# Patient Record
Sex: Female | Born: 1950 | Race: White | Hispanic: No | Marital: Married | Smoking: Never smoker
Health system: Southern US, Community
[De-identification: ages and names within clinical notes are randomized; demographics above are authoritative.]

## PROBLEM LIST (undated history)

## (undated) DIAGNOSIS — M8580 Other specified disorders of bone density and structure, unspecified site: Secondary | ICD-10-CM

## (undated) DIAGNOSIS — Z800 Family history of malignant neoplasm of digestive organs: Secondary | ICD-10-CM

## (undated) DIAGNOSIS — Z8042 Family history of malignant neoplasm of prostate: Secondary | ICD-10-CM

## (undated) DIAGNOSIS — N393 Stress incontinence (female) (male): Secondary | ICD-10-CM

## (undated) DIAGNOSIS — K9289 Other specified diseases of the digestive system: Secondary | ICD-10-CM

## (undated) DIAGNOSIS — M419 Scoliosis, unspecified: Secondary | ICD-10-CM

## (undated) DIAGNOSIS — K224 Dyskinesia of esophagus: Secondary | ICD-10-CM

## (undated) DIAGNOSIS — E739 Lactose intolerance, unspecified: Secondary | ICD-10-CM

## (undated) DIAGNOSIS — D649 Anemia, unspecified: Secondary | ICD-10-CM

## (undated) DIAGNOSIS — Z8489 Family history of other specified conditions: Secondary | ICD-10-CM

## (undated) DIAGNOSIS — R202 Paresthesia of skin: Secondary | ICD-10-CM

## (undated) DIAGNOSIS — G5623 Lesion of ulnar nerve, bilateral upper limbs: Secondary | ICD-10-CM

## (undated) DIAGNOSIS — Z803 Family history of malignant neoplasm of breast: Secondary | ICD-10-CM

## (undated) DIAGNOSIS — I7300 Raynaud's syndrome without gangrene: Secondary | ICD-10-CM

## (undated) DIAGNOSIS — M1990 Unspecified osteoarthritis, unspecified site: Secondary | ICD-10-CM

## (undated) DIAGNOSIS — Z8043 Family history of malignant neoplasm of testis: Secondary | ICD-10-CM

## (undated) DIAGNOSIS — G8929 Other chronic pain: Secondary | ICD-10-CM

## (undated) DIAGNOSIS — K219 Gastro-esophageal reflux disease without esophagitis: Secondary | ICD-10-CM

## (undated) DIAGNOSIS — F338 Other recurrent depressive disorders: Secondary | ICD-10-CM

## (undated) DIAGNOSIS — M4056 Lordosis, unspecified, lumbar region: Secondary | ICD-10-CM

## (undated) DIAGNOSIS — I809 Phlebitis and thrombophlebitis of unspecified site: Secondary | ICD-10-CM

## (undated) DIAGNOSIS — Z8049 Family history of malignant neoplasm of other genital organs: Secondary | ICD-10-CM

## (undated) DIAGNOSIS — K5909 Other constipation: Secondary | ICD-10-CM

## (undated) DIAGNOSIS — F410 Panic disorder [episodic paroxysmal anxiety] without agoraphobia: Secondary | ICD-10-CM

## (undated) DIAGNOSIS — Z801 Family history of malignant neoplasm of trachea, bronchus and lung: Secondary | ICD-10-CM

## (undated) DIAGNOSIS — K220 Achalasia of cardia: Secondary | ICD-10-CM

## (undated) DIAGNOSIS — J45909 Unspecified asthma, uncomplicated: Secondary | ICD-10-CM

## (undated) DIAGNOSIS — Z9889 Other specified postprocedural states: Secondary | ICD-10-CM

## (undated) DIAGNOSIS — Z8719 Personal history of other diseases of the digestive system: Secondary | ICD-10-CM

## (undated) DIAGNOSIS — M549 Dorsalgia, unspecified: Secondary | ICD-10-CM

## (undated) DIAGNOSIS — M479 Spondylosis, unspecified: Secondary | ICD-10-CM

## (undated) DIAGNOSIS — R112 Nausea with vomiting, unspecified: Secondary | ICD-10-CM

## (undated) DIAGNOSIS — Z808 Family history of malignant neoplasm of other organs or systems: Secondary | ICD-10-CM

## (undated) DIAGNOSIS — M2550 Pain in unspecified joint: Secondary | ICD-10-CM

## (undated) DIAGNOSIS — Z8052 Family history of malignant neoplasm of bladder: Secondary | ICD-10-CM

## (undated) DIAGNOSIS — I10 Essential (primary) hypertension: Secondary | ICD-10-CM

## (undated) HISTORY — PX: KNEE ARTHROSCOPY W/ SYNOVECTOMY: SHX1887

## (undated) HISTORY — DX: Phlebitis and thrombophlebitis of unspecified site: I80.9

## (undated) HISTORY — DX: Anemia, unspecified: D64.9

## (undated) HISTORY — DX: Family history of malignant neoplasm of breast: Z80.3

## (undated) HISTORY — PX: KNEE ARTHROSCOPY: SUR90

## (undated) HISTORY — DX: Lordosis, unspecified, lumbar region: M40.56

## (undated) HISTORY — PX: ESOPHAGEAL DILATION: SHX303

## (undated) HISTORY — DX: Other recurrent depressive disorders: F33.8

## (undated) HISTORY — PX: OTHER SURGICAL HISTORY: SHX169

## (undated) HISTORY — DX: Pain in unspecified joint: M25.50

## (undated) HISTORY — DX: Family history of malignant neoplasm of bladder: Z80.52

## (undated) HISTORY — PX: TOTAL KNEE ARTHROPLASTY: SHX125

## (undated) HISTORY — PX: DILATION AND CURETTAGE OF UTERUS: SHX78

## (undated) HISTORY — DX: Family history of malignant neoplasm of digestive organs: Z80.0

## (undated) HISTORY — DX: Panic disorder (episodic paroxysmal anxiety): F41.0

## (undated) HISTORY — DX: Family history of malignant neoplasm of prostate: Z80.42

## (undated) HISTORY — DX: Family history of malignant neoplasm of other genital organs: Z80.49

## (undated) HISTORY — DX: Family history of malignant neoplasm of trachea, bronchus and lung: Z80.1

## (undated) HISTORY — DX: Raynaud's syndrome without gangrene: I73.00

## (undated) HISTORY — DX: Other specified disorders of bone density and structure, unspecified site: M85.80

## (undated) HISTORY — DX: Scoliosis, unspecified: M41.9

## (undated) HISTORY — DX: Family history of malignant neoplasm of testis: Z80.43

## (undated) HISTORY — DX: Family history of malignant neoplasm of other organs or systems: Z80.8

---
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
## 1998-12-25 ENCOUNTER — Encounter: Payer: Self-pay | Admitting: Gastroenterology

## 1998-12-25 ENCOUNTER — Other Ambulatory Visit: Admission: RE | Admit: 1998-12-25 | Discharge: 1998-12-25 | Payer: Self-pay | Admitting: Obstetrics and Gynecology

## 1998-12-25 ENCOUNTER — Ambulatory Visit (HOSPITAL_COMMUNITY): Admission: RE | Admit: 1998-12-25 | Discharge: 1998-12-25 | Payer: Self-pay | Admitting: Gastroenterology

## 1998-12-26 ENCOUNTER — Encounter: Payer: Self-pay | Admitting: Gastroenterology

## 1998-12-26 ENCOUNTER — Ambulatory Visit (HOSPITAL_COMMUNITY): Admission: RE | Admit: 1998-12-26 | Discharge: 1998-12-26 | Payer: Self-pay | Admitting: Gastroenterology

## 1999-01-23 ENCOUNTER — Ambulatory Visit (HOSPITAL_COMMUNITY): Admission: RE | Admit: 1999-01-23 | Discharge: 1999-01-23 | Payer: Self-pay | Admitting: *Deleted

## 1999-11-30 ENCOUNTER — Encounter: Admission: RE | Admit: 1999-11-30 | Discharge: 1999-11-30 | Payer: Self-pay | Admitting: Family Medicine

## 1999-12-06 ENCOUNTER — Encounter: Admission: RE | Admit: 1999-12-06 | Discharge: 1999-12-06 | Payer: Self-pay | Admitting: Family Medicine

## 2000-01-29 ENCOUNTER — Encounter: Admission: RE | Admit: 2000-01-29 | Discharge: 2000-01-29 | Payer: Self-pay | Admitting: Family Medicine

## 2000-02-13 DIAGNOSIS — M4056 Lordosis, unspecified, lumbar region: Secondary | ICD-10-CM

## 2000-02-13 HISTORY — DX: Lordosis, unspecified, lumbar region: M40.56

## 2000-02-20 ENCOUNTER — Other Ambulatory Visit: Admission: RE | Admit: 2000-02-20 | Discharge: 2000-02-20 | Payer: Self-pay | Admitting: Obstetrics and Gynecology

## 2000-04-21 ENCOUNTER — Ambulatory Visit (HOSPITAL_COMMUNITY): Admission: RE | Admit: 2000-04-21 | Discharge: 2000-04-21 | Payer: Self-pay | Admitting: Gastroenterology

## 2000-06-27 ENCOUNTER — Encounter: Admission: RE | Admit: 2000-06-27 | Discharge: 2000-06-27 | Payer: Self-pay | Admitting: Obstetrics and Gynecology

## 2000-06-27 ENCOUNTER — Encounter: Payer: Self-pay | Admitting: Obstetrics and Gynecology

## 2000-07-04 ENCOUNTER — Other Ambulatory Visit: Admission: RE | Admit: 2000-07-04 | Discharge: 2000-07-04 | Payer: Self-pay | Admitting: Obstetrics and Gynecology

## 2000-07-04 ENCOUNTER — Encounter (INDEPENDENT_AMBULATORY_CARE_PROVIDER_SITE_OTHER): Payer: Self-pay

## 2001-04-06 ENCOUNTER — Other Ambulatory Visit: Admission: RE | Admit: 2001-04-06 | Discharge: 2001-04-06 | Payer: Self-pay | Admitting: Obstetrics and Gynecology

## 2002-03-19 ENCOUNTER — Other Ambulatory Visit: Admission: RE | Admit: 2002-03-19 | Discharge: 2002-03-19 | Payer: Self-pay | Admitting: Obstetrics and Gynecology

## 2002-07-23 ENCOUNTER — Encounter: Admission: RE | Admit: 2002-07-23 | Discharge: 2002-07-23 | Payer: Self-pay | Admitting: Obstetrics and Gynecology

## 2002-07-23 ENCOUNTER — Encounter: Payer: Self-pay | Admitting: Obstetrics and Gynecology

## 2002-09-21 ENCOUNTER — Ambulatory Visit (HOSPITAL_COMMUNITY): Admission: RE | Admit: 2002-09-21 | Discharge: 2002-09-21 | Payer: Self-pay | Admitting: Obstetrics and Gynecology

## 2002-09-21 ENCOUNTER — Encounter (INDEPENDENT_AMBULATORY_CARE_PROVIDER_SITE_OTHER): Payer: Self-pay

## 2003-06-16 ENCOUNTER — Other Ambulatory Visit: Admission: RE | Admit: 2003-06-16 | Discharge: 2003-06-16 | Payer: Self-pay | Admitting: Obstetrics and Gynecology

## 2003-08-16 LAB — HM COLONOSCOPY

## 2003-09-18 ENCOUNTER — Encounter: Admission: RE | Admit: 2003-09-18 | Discharge: 2003-09-18 | Payer: Self-pay | Admitting: Orthopedic Surgery

## 2003-09-27 ENCOUNTER — Ambulatory Visit (HOSPITAL_BASED_OUTPATIENT_CLINIC_OR_DEPARTMENT_OTHER): Admission: RE | Admit: 2003-09-27 | Discharge: 2003-09-27 | Payer: Self-pay | Admitting: Orthopedic Surgery

## 2004-05-08 ENCOUNTER — Encounter: Admission: RE | Admit: 2004-05-08 | Discharge: 2004-05-08 | Payer: Self-pay | Admitting: Obstetrics and Gynecology

## 2004-07-15 HISTORY — PX: HELLER MYOTOMY: SHX5259

## 2004-08-20 ENCOUNTER — Other Ambulatory Visit: Admission: RE | Admit: 2004-08-20 | Discharge: 2004-08-20 | Payer: Self-pay | Admitting: Obstetrics and Gynecology

## 2005-08-22 ENCOUNTER — Other Ambulatory Visit: Admission: RE | Admit: 2005-08-22 | Discharge: 2005-08-22 | Payer: Self-pay | Admitting: Obstetrics & Gynecology

## 2005-09-02 ENCOUNTER — Encounter: Admission: RE | Admit: 2005-09-02 | Discharge: 2005-09-02 | Payer: Self-pay | Admitting: Obstetrics and Gynecology

## 2005-09-12 HISTORY — PX: VARICOSE VEIN SURGERY: SHX832

## 2006-02-05 ENCOUNTER — Inpatient Hospital Stay (HOSPITAL_COMMUNITY): Admission: RE | Admit: 2006-02-05 | Discharge: 2006-02-10 | Payer: Self-pay | Admitting: Orthopedic Surgery

## 2006-09-09 ENCOUNTER — Other Ambulatory Visit: Admission: RE | Admit: 2006-09-09 | Discharge: 2006-09-09 | Payer: Self-pay | Admitting: Obstetrics & Gynecology

## 2006-09-10 ENCOUNTER — Encounter: Admission: RE | Admit: 2006-09-10 | Discharge: 2006-09-10 | Payer: Self-pay | Admitting: Obstetrics and Gynecology

## 2006-09-29 ENCOUNTER — Encounter: Admission: RE | Admit: 2006-09-29 | Discharge: 2006-09-29 | Payer: Self-pay | Admitting: Obstetrics and Gynecology

## 2007-08-14 ENCOUNTER — Inpatient Hospital Stay (HOSPITAL_COMMUNITY): Admission: RE | Admit: 2007-08-14 | Discharge: 2007-08-18 | Payer: Self-pay | Admitting: Orthopedic Surgery

## 2007-10-12 ENCOUNTER — Other Ambulatory Visit: Admission: RE | Admit: 2007-10-12 | Discharge: 2007-10-12 | Payer: Self-pay | Admitting: Obstetrics and Gynecology

## 2007-11-25 ENCOUNTER — Encounter: Admission: RE | Admit: 2007-11-25 | Discharge: 2007-11-25 | Payer: Self-pay | Admitting: Obstetrics and Gynecology

## 2007-12-11 ENCOUNTER — Encounter: Admission: RE | Admit: 2007-12-11 | Discharge: 2007-12-11 | Payer: Self-pay | Admitting: Obstetrics and Gynecology

## 2008-10-17 ENCOUNTER — Other Ambulatory Visit: Admission: RE | Admit: 2008-10-17 | Discharge: 2008-10-17 | Payer: Self-pay | Admitting: Obstetrics and Gynecology

## 2009-02-27 DIAGNOSIS — I10 Essential (primary) hypertension: Secondary | ICD-10-CM | POA: Insufficient documentation

## 2009-02-27 DIAGNOSIS — J45909 Unspecified asthma, uncomplicated: Secondary | ICD-10-CM | POA: Insufficient documentation

## 2009-05-24 DIAGNOSIS — G5620 Lesion of ulnar nerve, unspecified upper limb: Secondary | ICD-10-CM | POA: Insufficient documentation

## 2009-05-24 DIAGNOSIS — J309 Allergic rhinitis, unspecified: Secondary | ICD-10-CM | POA: Insufficient documentation

## 2009-06-21 ENCOUNTER — Encounter: Admission: RE | Admit: 2009-06-21 | Discharge: 2009-06-21 | Payer: Self-pay | Admitting: Obstetrics and Gynecology

## 2009-08-07 ENCOUNTER — Ambulatory Visit (HOSPITAL_COMMUNITY): Admission: RE | Admit: 2009-08-07 | Discharge: 2009-08-07 | Payer: Self-pay | Admitting: Orthopedic Surgery

## 2009-09-20 ENCOUNTER — Ambulatory Visit (HOSPITAL_BASED_OUTPATIENT_CLINIC_OR_DEPARTMENT_OTHER): Admission: RE | Admit: 2009-09-20 | Discharge: 2009-09-20 | Payer: Self-pay | Admitting: Orthopedic Surgery

## 2009-10-18 LAB — HM PAP SMEAR

## 2010-01-29 ENCOUNTER — Ambulatory Visit (HOSPITAL_COMMUNITY): Admission: RE | Admit: 2010-01-29 | Discharge: 2010-01-29 | Payer: Self-pay | Admitting: Obstetrics and Gynecology

## 2010-08-05 ENCOUNTER — Encounter: Payer: Self-pay | Admitting: Obstetrics and Gynecology

## 2010-09-30 ENCOUNTER — Ambulatory Visit (HOSPITAL_COMMUNITY)
Admission: AD | Admit: 2010-09-30 | Discharge: 2010-09-30 | Disposition: A | Discharge status: Home / Self Care | Payer: Managed Care, Other (non HMO) | Source: Ambulatory Visit | Attending: Gastroenterology | Admitting: Gastroenterology

## 2010-09-30 DIAGNOSIS — IMO0002 Reserved for concepts with insufficient information to code with codable children: Secondary | ICD-10-CM | POA: Insufficient documentation

## 2010-09-30 DIAGNOSIS — J45909 Unspecified asthma, uncomplicated: Secondary | ICD-10-CM | POA: Insufficient documentation

## 2010-09-30 DIAGNOSIS — I10 Essential (primary) hypertension: Secondary | ICD-10-CM | POA: Insufficient documentation

## 2010-09-30 DIAGNOSIS — Z96659 Presence of unspecified artificial knee joint: Secondary | ICD-10-CM | POA: Insufficient documentation

## 2010-09-30 DIAGNOSIS — T18108A Unspecified foreign body in esophagus causing other injury, initial encounter: Secondary | ICD-10-CM | POA: Insufficient documentation

## 2010-09-30 LAB — CBC
HCT: 40.8 % (ref 36.0–46.0)
MCH: 32.4 pg (ref 26.0–34.0)
MCHC: 34.7 g/dL (ref 30.0–36.0)
MCV: 93.2 fL (ref 78.0–100.0)
RDW: 13.8 % (ref 11.5–15.5)

## 2010-09-30 LAB — BASIC METABOLIC PANEL
BUN: 11 mg/dL (ref 6–23)
Chloride: 105 mEq/L (ref 96–112)
GFR calc non Af Amer: >60 mL/min (ref >60)
Glucose, Bld: 103 mg/dL — ABNORMAL HIGH (ref 70–99)
Potassium: 3.3 mEq/L — ABNORMAL LOW (ref 3.5–5.1)

## 2010-10-07 LAB — POCT PREGNANCY, URINE: Preg Test, Ur: NEGATIVE

## 2010-11-27 NOTE — Op Note (Signed)
Beth Peters, Beth Peters             ACCOUNT NO.:  [id number]   MEDICAL RECORD NO.:  [id number]          PATIENT TYPE:  INP   LOCATION:  0010                         FACILITY:  [facility]   PHYSICIAN:  Ollen Gross, M.D.    DATE OF BIRTH:  12-03-1950   DATE OF PROCEDURE:  08/14/2007  DATE OF DISCHARGE:                               OPERATIVE REPORT   PREOPERATIVE DIAGNOSIS:  Osteoarthritis, right knee.   POSTOPERATIVE DIAGNOSIS:  Osteoarthritis, right knee.   PROCEDURE:  Right total knee arthroplasty.   SURGEON:  Ollen Gross, M.D.   ASSISTANT:  Avel Peace. P.A.-C.   ANESTHESIA:  General with postoperative Marcaine pain pump.   ESTIMATED BLOOD LOSS:  Minimal.   DRAIN:  None.   TOURNIQUET TIME:  31 minutes at 300 mmHg.   COMPLICATIONS:  None.   CONDITION:  Stable to recovery.   BRIEF CLINICAL NOTE:  Beth Peters is a 59 year old female with end-stage  osteoarthritis of the right knee with progressively worsening pain and  dysfunction.  She has had a successful left total knee arthroplasty and  presents now for right total knee arthroplasty.   PROCEDURE IN DETAIL:  After successful administration of general  anesthetic, a tourniquet is placed high on the right thigh, and right  lower extremity is prepped and draped in the usual sterile fashion.  Extremity is wrapped in Esmarch, knee flexed and tourniquet is inflated  to 300 mmHg.  Midline incision is made with a 10 blade through  subcutaneous tissue to the level of the extensor mechanism.  Fresh blade  is used make a medial parapatellar arthrotomy.  Soft tissue over the  proximal and medial tibia is subperiosteally elevated through the joint  line with the knife and into the semimembranosus bursa with a Cobb  elevator.  Soft tissue over the proximal and lateral tibia is elevated  with attention being paid to avoiding the patellar tendon over the  tibial tubercle.  Patella subluxed laterally, knee flexed to 90 degrees,  ACL and  PCL removed.  Drill is used to create a starting hole in the  distal femur, and the canal is thoroughly irrigated.  The 5 degrees  right valgus alignment guide is placed, and referencing off the  posterior condyles, rotation is marked and the block pinned to remove 10  mm off the distal femur.  Distal femoral resection is made with an  oscillating saw.  Sizing blocks placed and size 2.5 is most appropriate.  Rotation is marked off the epicondylar axis.  Size 2.5 cutting blocks  placed, and the anterior, posterior and chamfer cuts are made.   Tibia subluxed forward and the menisci are removed.  The extramedullary  tibial alignment guide is placed referencing proximally at the medial  aspect of tibial tubercle and distally along the second metatarsal axis  and tibial crest.  The block is pinned to remove 10 mm off the  nondeficient lateral side.  I went down an additional 2 to get to the  base of the medial defect.  Tibial resection is made with an oscillating  saw.  Size 2.5 is most appropriate tibial  component, and the proximal  tibia is prepared with the modular drill and keel punch for the size  2.5.  Femoral preparation is completed with the intercondylar cut.   Size 2.5 mobile bearing tibial trial, 2.5 posterior stabilized femoral  trial, and a 12.5-mm posterior stabilized rotating platform insert trial  are placed.  With a 12.5, full extension is achieved with excellent  varus and valgus balance throughout full range of motion.  Patella is  everted and thickness measured to be 21 mm.  Freehand resection is taken  to 12 mm, the 35 template is placed, lug holes are drilled, trial  patella is placed, and it tracks normally.  Osteophytes removed off the  posterior femur with the trial in place.  All trials removed, and the  cut bone surfaces are prepared with pulsatile lavage.  Cement is mixed,  and once ready for implantation, a size 2.5 mobile bearing tibial tray,  size 2.5 posterior  stabilized femur, and 35 patella are cemented into  place, and patella is held with the clamp.  A trial 12.5-mm posterior  stabilized rotating platform insert is placed in the tibial tray.  The  knee is held in full extension and all extruded cement removed.  Once  cement fully hardened and the wound is copiously irrigated with saline  solution, and FloSeal injected on the posterior capsule, the permanent  12.5-mm posterior stabilized rotating platform insert is then placed  into the tibial tray.  The FloSeal is injected into medial and lateral  gutters and suprapatellar area.  Moist sponge is placed and tourniquet  released with total time of 31 minutes.  The wound is again irrigated  and the extensor mechanism closed with interrupted #1 PDS.  Flexion  against gravity is 135 degrees.  Subcu is closed with interrupted 2-0  Vicryl and subcuticular running 4-0 Monocryl.  The catheter for Marcaine  pain pump is placed, and the pump is initiated.  Steri-Strips and a  bulky sterile dressing are applied, and she is placed into a knee  immobilizer, awakened and transferred to recovery in stable condition.      Ollen Gross, M.D.  Electronically Signed     FA/MEDQ  D:  08/14/2007  T:  08/15/2007  Job:  [id number]

## 2010-11-27 NOTE — H&P (Signed)
Beth Peters, Beth Peters             ACCOUNT NO.:  [id number]   MEDICAL RECORD NO.:  [id number]          PATIENT TYPE:  INP   LOCATION:  1611                         FACILITY:  [facility]   PHYSICIAN:  Ollen Gross, M.D.    DATE OF BIRTH:  1950/11/18   DATE OF ADMISSION:  08/14/2007  DATE OF DISCHARGE:                              HISTORY & PHYSICAL   DATE OF OFFICE VISIT HISTORY AND PHYSICAL:  Performed on July 31, 2007   CHIEF COMPLAINT:  Right knee pain.   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 Homero Fellers Aluisio having had a long, progressive history of  increasing pain in her knee.  She has been treated conservatively in the  past including injections for knee arthritis.  She has reached the point  now where she would like to have something done about.  It is felt she  would be a good candidate.  Risks and benefits have been discussed and  she elected to proceed with surgery.   ALLERGIES:  No known drug allergies.   CURRENT MEDICATIONS:  Celebrex, Nexium, hydrochlorothiazide, Vivelle-  Dot, Prometrium, Crestor.   PAST MEDICAL HISTORY:  1. Anxiety.  2. Mild asthma.  3. History of bronchitis.  4. History of pneumonia.  5. Hypertension.  6. Achalasia.  7. Reflux disease.  8. Osteopenia.  9. History of uterine fibroids.  10.Degenerative disk disease.  11.Childhood illnesses to include measles, mumps and rubella.   PAST SURGICAL HISTORY:  1. Tonsils.  2. Calcium deposit removed from her knee.  3. Left knee meniscal surgery.  4. Left knee cartilage repair.  5. Heller myotomy.  6. Vein removal.  7. Left total knee in 2007.   SOCIAL HISTORY:  Married, nonsmoker.  One to two glasses of wine a  couple of days a week.  Two children.  Family will be assisting with  care after surgery.   FAMILY HISTORY:  Father deceased with history of cancer and stroke.  Mother, age 16, heart disease, thyroid and arthritis.  She has three  siblings  and they have a history of cancer, thyroid disease and  hypertension.   REVIEW OF SYSTEMS:  GENERAL:  No fevers, chills or night sweats.  NEURO:  No seizure, syncope, paralysis.  RESPIRATORY:  No shortness of breath,  productive cough or hemoptysis.  CARDIOVASCULAR:  No chest pain, angina,  orthopnea.  GI:  She does have achalasia with difficulty swallowing and  reflux.  No nausea, vomiting, diarrhea, constipation.  GU:  No dysuria  or hematuria, a little bit of nocturia.  MUSCULOSKELETAL:  Right knee.   PHYSICAL EXAMINATION:  VITAL SIGNS:  Pulse 72, respirations 12, blood  pressure 122/78.  GENERAL:  A 60 year old white female, well-nourished, well-developed, no  acute distress, alert and oriented and cooperative, very pleasant,  excellent historian.  HEENT:  Normocephalic, atraumatic.  Pupils are round and reactive.  Oropharynx clear.  EOMs intact.  NECK:  Supple.  CHEST: Clear.  HEART:  Regular rate and rhythm.  No murmur.  S1, S2 noted.  ABDOMEN:  Soft, nontender.  Bowel  sounds present.  RECTAL, BREAST, GENITALIA:  Not done, not pertinent to present illness.  EXTREMITIES:  Right knee:  Moderate effusion, tender medially, valgus  malalignment.   IMPRESSION:  1. Osteoarthritis right knee.  2. Anxiety.  3. Mild asthma.  4. History of bronchitis.  5. History of pneumonia.  6. Hypertension.  7. Achalasia.  8. Reflux disease.  9. Osteopenia.  10.History of uterine fibroids.  11.Degenerative disk disease.   PLAN:  The patient admitted to [facility] to undergo a right  total knee replacement arthroplasty.  Surgery will be performed by Dr.  Ollen Gross.      Alexzandrew L. Perkins, P.A.C.      Ollen Gross, M.D.  Electronically Signed    ALP/MEDQ  D:  08/15/2007  T:  08/16/2007  Job:  [id number]   cc:   Gloriajean Dell. Andrey Campanile, M.D.  Fax: [phone]   Rema Fendt, NP  [facility]   Bernette Redbird, M.D.  Fax: [phone]

## 2010-11-30 NOTE — Op Note (Signed)
NAME:  Beth Peters, Beth Peters                       ACCOUNT NO.:  [id number]   MEDICAL RECORD NO.:  [id number]                   PATIENT TYPE:  AMB   LOCATION:  DSC                                  FACILITY:  [facility]   PHYSICIAN:  Ollen Gross, M.D.                 DATE OF BIRTH:  18-Jul-1950   DATE OF PROCEDURE:  09/27/2003  DATE OF DISCHARGE:                                 OPERATIVE REPORT   PREOPERATIVE DIAGNOSIS:  Left knee chondral defect.   POSTOPERATIVE DIAGNOSIS:  Left knee chondral defect.   PROCEDURE:  Left knee arthroscopy with chondroplasty.   SURGEON:  Ollen Gross, M.D.   ANESTHESIA:  General plus knee block.   ESTIMATED BLOOD LOSS:  Minimal.   DRAINS:  Hemovac x 1.   COMPLICATIONS:  None.   CONDITION:  Stable to the recovery room.   BRIEF CLINICAL NOTE:  Beth Peters is a 60 year old female with significant left knee  pain and recurrent effusions.  She has medial and lateral pain.  She had a  recent knee arthroscopy about 4-5 months ago with a good result initially  and then began to deteriorate with recurrent pain and swelling.  Exam and  history are consistent with chondral defect.  She presents now for  arthroscopy and debridement.   PROCEDURE IN DETAIL:  After successful administration of knee block and  subsequent general anesthetic, a tourniquet was placed high on the left  thigh and the left lower extremity prepped and draped in the usual sterile  fashion.  Standard superomedial and inferolateral incisions were made and  the in flow cannula passed superomedial and camera passed inferolateral.  Arthroscopic visualization proceeds.  There is a tremendous amount of  synovitis noted with a large suprapatellar medial plica.  There is grade 2  and 3 chondromalacia on the under surface of the patella with no focal  chondral defect.  The trochlea looks normal.  There was just some grade 1  change in the trochlea.  The medial and lateral gutters were visualized and  there  is synovitis but no loose body.  Flexion valgus was applied to the  knee and the medial compartment was entered.  A spinal needle was used to  localize the inferomedial portal.  A small incision was made and the probe  was placed.  The medial meniscal remnant appears and probes normally.  There  is no recurrent tear.  There is some chondromalacia grade 2 and 3 on the  medial femoral condyle with unstable appearing cartilage on the condyle.  I  debrided it back to a stable cartilaginous base with the shaver.  It was  about a 2 by 2 cm area.  There was no exposed bone.  The intercondylar notch  is visualized and the ACL appears and probes normally.  The lateral  compartment was entered.  There was some grade 1 change on the lateral  tibial plateau but no  focal chondral defects and no meniscal tear.  I again  addressed the patellofemoral compartment by debriding the unstable cartilage  on the under surface of the patella back to a stable cartilaginous space.  Again, there is no exposed bone.  The superomedial plica is also debrided.  I did debride the inflamed synovial tissue throughout the joint.  The  arthroscopic equipment was then removed from the inferior portals which were  closed with interrupted 4-0 nylon.  20 mL of 0.25% Marcaine with epinephrine  was injected through the in flow cannula and then a Hemovac drain is  threaded through the cannula, the cannula removed, and that portal sewn with  the drain not sewn in.  A bulky, sterile dressing is applied.  She is then  awakened and transferred to the recovery room in stable condition.                                               Ollen Gross, M.D.    FA/MEDQ  D:  09/27/2003  T:  09/28/2003  Job:  [id number]

## 2010-11-30 NOTE — Op Note (Signed)
NAMEDANETRA, GLOCK             ACCOUNT NO.:  [id number]   MEDICAL RECORD NO.:  [id number]          PATIENT TYPE:  INP   LOCATION:  0004                         [facility]ACILITY:  [facility]   PHYSICIAN:  Ollen Gross, M.D.    DATE OF BIRTH:  Dec 23, 1950   DATE OF PROCEDURE:  02/05/2006  DATE OF DISCHARGE:                                 OPERATIVE REPORT   PREOPERATIVE DIAGNOSIS:  Osteoarthritis, left knee.   POSTOPERATIVE DIAGNOSIS:  Osteoarthritis, left knee.   PROCEDURE:  Left total knee arthroplasty.   SURGEON:  Ollen Gross, M.D.   ASSISTANT:  Alexzandrew L. Julien Girt, P.A.   ANESTHESIA:  General with postoperative Marcaine pain pump.   ESTIMATED BLOOD LOSS:  Minimal   DRAINS:  Hemovac x1.   TOURNIQUET TIME:  46 minutes at 300 mmHg.   COMPLICATIONS:  None.   CONDITION:  Stable to recovery.   BRIEF CLINICAL NOTE:  Beth Peters is a 60 year old female who has had a long history  of problems related to her left knee.  She has had several arthroscopies and  has had recurrent pain.  She has significant degenerative change in medial  and patellofemoral compartments and presents for total knee arthroplasty.   PROCEDURE IN DETAIL:  After successful initiation of general anesthetic, a  tourniquet was placed high on the left thigh and left lower extremity  prepped and draped in usual sterile fashion.  Extremity was wrapped in  Esmarch, knee flexed, tourniquet inflated to 300 mmHg.  Midline incision  made with a 10 blade through subcutaneous tissue to the level of the  extensor mechanism.  Fresh blade was used make a medial parapatellar  arthrotomy.  Soft tissue of the proximal medial tibia was subperiosteally  elevated to the joint line with the knife entered through the  semimembranosus bursa with a Cobb elevator.  Soft tissue laterally was  elevated with attention being paid to avoiding the patellar tendon on tibial  tubercle.  Patella subluxed laterally, knee flexed 90 degrees, ACL and  PCL  removed.  She had bone on bone in the medial compartment as well as trochlea  and patella.  Drill was used to create a starting in the distal femur, and  canal was thoroughly irrigated.  Five-degree left valgus alignment guide was  placed, and referencing off the posterior condyle, rotation is marked and a  block pin removed 10 mm off the distal femur.  Distal femoral resection was  made with an oscillating saw.  Sizing block was placed, and 2.5 was most  appropriate.  Rotations marked off the epicondylar axis.  A 2.5 cutting  block was placed, and the anterior, posterior and chamfer cuts made.   Tibia was subluxed forward, the menisci were removed.  Extramedullary tibial  alignment guide was placed referencing proximally at the medial aspect of  the tibial tubercle and distally along the second metatarsal axis and tibial  crest.  Block pin removed 10 mm of the non deficient lateral side.  Tibial  resection was made with an oscillating saw.  Size 2.5 was the most  appropriate tibial component and the proximal tibia prepared  with a modular  drill and keel punch for a 2.5.  Femoral preparation was completed the  intercondylar cut.   Size 2.5 mobile bearing tibial trial size, 2.5 posterior stabilized femoral  trial and 10-mm posterior stabilized rotating platform insert trial were  placed.  A 10 was too loose in flexion and was perfectly balanced in  extension.  We wen to 12.5 which was well balanced in flexion but was too  tight in extension.  I thus removed the femoral component and then again  placed the distal femoral cutting guide, referencing with the intramedullary  rod.  I took an additional 2 mm off of the distal femur and then again redid  the chamfer cuts and intercondylar cut.  We then placed the 2.5 femoral  trial again.  At this time with 12.5 mm spacer, we had great balance in  flexion and extension throughout full range of motion.  Patella was then  everted, thickness  measured to be 22 mm.  Freehand resection was taken down  to 12 mm, 35 template was placed, lug holes were drilled, trial patella was  placed and it tracks normally.  Osteophytes removed with posterior femur  trials in place.  All trials removed, and the cut bone surfaces prepared  with pulsatile lavage.  Cement was mixed, and once ready for implantation,  the size 2.5 mobile bearing tibial tray, the size 2.5 posterior stabilized  femur and 35 patella were cemented into place, and patella was held the  clamp.  A trial of 12.5 insert was placed, knee held in full extension, all  extruded cement removed.  Once cement was fully hardened, the permanent 12.5-  mm posterior stabilized rotating platform insert was placed into the tibial  tray.  The wound was copiously irrigated with saline solution and extensor  mechanism closed over a Hemovac drain with interrupted #1 PDS.  Flexion  against gravity was 135 degrees.  Tourniquet was released at a total time of  46 minutes.  Subcu was closed interrupted 2-0 Vicryl and subcuticular  running 4-0 Monocryl.  Hemovac drain was hooked to suction.  Catheter for  the Marcaine pain pump was placed, and the pump was initiated.  Steri-Strips  and a bulky sterile dressing were applied, and she was placed into a knee  immobilizer, awakened and transferred to recovery in stable condition.      Ollen Gross, M.D.  Electronically Signed     FA/MEDQ  D:  02/05/2006  T:  02/05/2006  Job:  [id number]

## 2010-11-30 NOTE — Op Note (Signed)
NAME:  TORRANCE, FRECH                       ACCOUNT NO.:  [id number]   MEDICAL RECORD NO.:  [id number]                   PATIENT TYPE:  AMB   LOCATION:  SDC                                  FACILITY:  [facility]   PHYSICIAN:  Laqueta Linden, M.D.                 DATE OF BIRTH:  May 15, 1951   DATE OF PROCEDURE:  09/21/2002  DATE OF DISCHARGE:                                 OPERATIVE REPORT   PREOPERATIVE DIAGNOSES:  Perimenopausal dysfunctional uterine bleeding due  to endometrial polyp/submucosal fibroid.   POSTOPERATIVE DIAGNOSES:  Perimenopausal dysfunctional uterine bleeding due  to endometrial polyp/submucosal fibroid.   PROCEDURE:  Hysteroscopic resection with curettage.   SURGEON:  Laqueta Linden, M.D.   ANESTHESIA:  General endotracheal.   ESTIMATED BLOOD LOSS:  Less than 50 mL.   FLUIDS:  Sorbitol net intake 80 mL.   COMPLICATIONS:  None.   CONDITION ON DISCHARGE:  Stable to the PACU.   INDICATIONS:  The patient is a 60 year old gravida 2, para 2 married white  female with perimenopausal bleeding on hormonal replacement therapy.  Her  ultrasound with sonohysterogram revealed a 2.1 x 1.6 x 0.9 cm polyp versus  submucosal fibroid.  She has seen the informed consent film, voiced her  understanding and acceptance of the risks of procedure including, but not  limited to, anesthesia risks, infection, bleeding, possible incomplete  relief of symptoms, risk of uterine perforation, possible regrowth  particularly of the fibroid with continued bleeding, and agrees to proceed.  Full consent was given.  The patient was placed under general endotracheal  anesthesia due to her history of achalasia and severe reflux.   PROCEDURE:  The patient was taken to the operating room and after proper  identification and consents were ascertained she was placed on the operating  table in supine position.  After the induction of general endotracheal  anesthesia she was placed in the [place]  stirrups and the perineum and vagina  were prepped and draped in a routine sterile fashion.  A transurethral Foley  was placed and removed at the conclusion of the procedure.  Bimanual  examination confirmed an anterior, slightly enlarged, irregular uterus which  was mobile.  The speculum was placed in the vagina.  The cervix grasped with  a single tooth tenaculum.  The internal os was gently dilated to a number 33  Pratt dilator.  The resectoscope with continuous sorbitol infusion was then  inserted under direct vision.  The endocervical canal was free of lesions.  There was a large polyp projecting off of the left lateral wall of the  endometrial cavity.  Above this was one or two partially submucosal  fibroids.  Visualization of the upper fundus was compromised by these  lesions.  The double loop resectoscope was utilized on routine settings and  the polyp and then the fibroids were resected in multiple pieces.  It should  be noted  that probably only 90% of the fibroids were resected as the  resection was going fairly deep into the myometrium and it was felt that  further resection would place the patient at an unacceptable risk of uterine  perforation.  Multiple pieces of tissue were removed.  There were no focal  lesions remaining at the conclusion of the procedure with the exception of  the remaining parts of the fibroids as noted.  Several small bleeding points  were cauterized.  Curettage productive of a minimal amount of additional  tissue was then performed.  All specimens were sent to pathology.  All  instruments were removed.  The tenaculum site was hemostatic.  There was no  excessive bleeding from the cervix.  Net sorbitol intake was 80 mL.  Estimated blood loss less than 50 mL.  Specimen were sent to pathology.  The  patient received Toradol 30 mg IV, 30 mg IM as the procedure was initiated  to decrease postoperative cramping.  She was stable on transfer to the PACU.  She  will be observed and discharged per anesthesia protocol.  She will take  Tylenol, Advil, or Aleve as needed for cramping and continue all her routine  medications.  She is to follow up in the office in two to three weeks' time  for a postoperative visit and call sooner for excessive pain, fever,  bleeding, or other concerns.  She was given routine verbal and written  discharge instructions.                                               Laqueta Linden, M.D.    LKS/MEDQ  D:  09/21/2002  T:  09/21/2002  Job:  [id number]

## 2010-11-30 NOTE — Procedures (Signed)
[facility] ([facility])  Patient:    Beth Peters, Beth Peters [facility]                  MRN: [id number] Proc. Date: 04/21/00 Adm. Date:  [id number] Attending:  Rich Brave CC:         Nolon Nations, M.D., M.P.H.   Procedure Report  PROCEDURE:  Upper endoscopy.  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  FINDINGS:  Small hiatal hernia. Dilated esophagus consistent with acolasia diagnosis. No evidence of esophageal neoplasia, ulcer disease, reflux esophagitis or other significant abnormalities otherwise.  DESCRIPTION OF PROCEDURE:  The nature, purpose and risks of the procedure were familiar to the patient from prior examinations and she provided written consent. Sedation was fentanyl was 5 mg IV (no fentanyl) in accordance with the patients desire to have just light sedation. There was no problem with arrhythmias, desaturation or hypotension during the course of this brief procedure.  The Olympus video endoscope was passed under direct vision but due to the overlying epiglottis, I never visualized the vocal cords. The esophagus was quite easily entered. The esophagus was noted to contain a fair amount of clear fluid, which may have been some oral secretions or reflux gastric contents. There was no evidence of reflux esophagitis, Barretts esophagus, varices, infection or neoplasia but the esophagus was noted to be somewhat dilated consistent with her diagnosis of acolasia. The scope was able to slip quite easily through the region of the lower esophageal sphincter and indeed there were times where the LES appeared to relax such that I could see down into the gastric area.  The stomach contained no significant residual and had completely normal mucosa, without evidence  of gastritis, erosions, ulcers, polyps or masses. A retroflexed view of the proximal stomach showed the small hiatal hernia from the inferior perspective. The pylorus, duodenal bulb and second duodenum looked normal.  The scope was then removed from the patient after suctioning out air from the stomach. No biopsies were obtained. The patient tolerated the procedure well and there were no apparent complications.  IMPRESSION: 1. Esophageal changes suggestive of acolasia without neoplasia or esophagitis    present. 2. Small hiatal hernia. 3. No endoscopic findings observed to correlate with the patients recent    change in abdominal symptoms, although as noted above those have pretty    well improved at this time.  PLAN:  Continue Nexium. Okay to use on a p.r.n. basis at this point. The patient has already been using it in this fashion. DD:  04/21/00 TD:  04/21/00 Job: [id number]

## 2010-11-30 NOTE — Discharge Summary (Signed)
[name], [name]             ACCOUNT NO.:  [id number]   MEDICAL RECORD NO.:  [id number]          PATIENT TYPE:  INP   LOCATION:  1517                         FACILITY:  [facility]   PHYSICIAN:  Ollen Gross, M.D.    DATE OF BIRTH:  04/21/1951   DATE OF ADMISSION:  02/05/2006  DATE OF DISCHARGE:  02/10/2006                                 DISCHARGE SUMMARY   ADMISSION DIAGNOSES:  1. Osteoarthritis, left knee.  2. Seasonal affect disorder, Winter months.  3. Mild asthma.  4. History of bronchitis.  5. History of pneumonia.  6. Hypertension.  7. Achalasia.  8. Esophageal spasm.  9. Osteopenia.  10.History of uterine fibroids.   DISCHARGE DIAGNOSES:  1. Osteoarthritis, left knee, status post left total knee arthroplasty.  2. Mild acute blood loss anemia, did not require transfusion.  3. Postoperative hyponatremia, improved.  4. Postoperative hypokalemia, improved.   __________ February 05, 2006 left total knee.   SURGEON:  Ollen Gross, M.D.   ASSISTANT:  Alexzandrew L. Julien Girt, P.A.   ANESTHESIA:  General __________.   CONSULTS:  None.   BRIEF HISTORY:  Beth Peters is a 60 year old female with long history of problems  related to her knee.  She has had several arthroscopies with recurrent pain.  She has developed significant degenerative changes, now will undergo knee  replacement.   LABORATORY:  Had a preop CBC with a hemoglobin of 14.4, hematocrit 42.3.  Post op hemoglobin 10.7, drifted down to 9.7.  Last H&H 9.6 and 28.3.  PT/PTT on admission 12.7/28 respectively.  INR 0.9.  Chem panel on admission  did have some elevated CO2 of 33.  Remaining chem panel all within normal  limits.  Serial BMETs were followed.  Sodium dropped from 136 to 132, back  up to 140.  Potassium dropped to 3.9, 3.4, back up to 4.3.  Urine pregnancy  tet negative.  Urinalysis preop negative.  Blood re-type AB positive.  EKG,  January 31, 2006, normal sinus rhythm.  Normal EKG.  Chest x-ray, January 31, 2006:  No active disease.   HOSPITAL COURSE:  Admitted to [facility].  Tolerated the procedure  well.  Transferred to recovery room and orthopedic floor.  Started on PCMP,  analgesics for pain control following surgery started getting up out of bed.  On day one she had a rough night after surgery but was doing a little bit  better.  She was already doing straight leg raises on post op day one which  was excellent.  Lovenox was started.  Recommended not to be on Coumadin.  She is unable to take pills so she was given Roxicodone elixir.  Hemovac  drain placed during surgery was removed.  She had a pain pump in place, had  a little bit of mild asthma and using Advair.  Due to the achalasia they  said they would recommend no Coumadin so we used Lovenox.  By day two she  was doing a little bit better but she did have some increased pain.  It was  felt that she had overworked her therapy yesterday.  She  had been up and  about.  Dressing was changed.  Incision looked good except for some  bruising.  She had a drop in her potassium.  Given potassium supplements.  She was doing well and it was felt that would be ready to go home in the  next couple of days.  She started getting up more and ambulating daily with  therapy.  Did well through the weekend.  Unfortunately, on day three, she  had a little bit of syncopal or presyncopal episode.  It sounded like she  vagal'd.  Her blood pressure was reported being low.  The next day her blood  pressure was up and she did not do much therapy the day before with the  vagal episode but she did continue to improve.  We kept her one more day for  therapy and, by the following day of February 10, 2006, she was doing much  better, progressing well, no more vagal episodes or low pressures.  She was  doing well and was ready to go home.   DISCHARGE PLAN:  The patient was discharged home on February 10, 2006.   DISCHARGE DIAGNOSES:  Please see above.    DISCHARGE MEDICATIONS:  1. Lovenox for one more day at home.  2. Roxicodone elixir.  3. Robaxin.   DIET:  As tolerated.   ACTIVITY:  Weightbearing as tolerated left lower extremity, gait training  ambulation, ADLs, home P. T., home nursing.   FOLLOW UP:  Follow up in two weeks.   DISPOSITION:  Home.   CONDITION ON DISCHARGE:  Improved.      Alexzandrew L. Julien Girt, P.A.      Ollen Gross, M.D.  Electronically Signed    ALP/MEDQ  D:  03/20/2006  T:  03/20/2006  Job:  [id number]   cc:   Ollen Gross, M.D.  Fax: [phone]

## 2010-11-30 NOTE — Discharge Summary (Signed)
NAMEELAJAH, Beth Peters             ACCOUNT NO.:  [id number]   MEDICAL RECORD NO.:  [id number]          PATIENT TYPE:  INP   LOCATION:  1611                         FACILITY:  [facility]   PHYSICIAN:  Ollen Gross, M.D.    DATE OF BIRTH:  1951-03-03   DATE OF ADMISSION:  08/14/2007  DATE OF DISCHARGE:  08/18/2007                               DISCHARGE SUMMARY   ADMITTING DIAGNOSES  1. Osteoarthritis, right knee.  2. Anxiety.  3. Mild asthma.  4. History of bronchitis.  5. History of pneumonia.  6. Hypertension.  7. Achalasia.  8. Reflux disease.  9. Osteopenia.  10.History of uterine fibroids.  11.Degenerative disk disease.   DISCHARGE DIAGNOSIS:  Osteoarthritis right knee status post right total  knee replacement arthroplasty.   REMAINING DISCHARGE DIAGNOSIS:  Osteoarthritis right knee status post  right total knee replacement arthroplasty.   PROCEDURE:  August 14, 2007 right total knee.   SURGEON:  Dr. Lequita Halt.   ASSISTANT:  Avel Peace PA-C.   ANESTHESIA:  General.   CONSULTS:  None.   BRIEF HISTORY:  [name] is a 60 year old female with end-stage arthritis  of the right knee, progressive worsening pain and dysfunction,  successful left total knee, now presents for right total knee.   LABORATORY DATA:  There is no copy of the lab reports on this chart.  I  will ask them to add to the chart.  Please see chart for lab values.   HOSPITAL COURSE:  The patient was admitted to [facility],  tolerated the procedure well, later transferred to the recovery room on  orthopedic floor, started on PCA and p.o. analgesic pain control  following surgery, doing pretty well on the morning of day one, normal  pain had Marcaine pain pump in place, using PCA supplemented by p.m.  meds.  Started getting up out of bed on day one, by day two doing a  little bit better, dressing changed, incision looked good, no signs of  infection.  Marcaine pain pump was removed, weaned over  p.o. meds,  decrease of fluids, up ambulating about 50 feet, continued to progress  well and ambulating greater than 65 feet by day three. Incision looked  good, continued to receive therapy for one more day, and by August 18, 2007 the patient was ambulating 200 feet, meeting goals with therapy.  Weaned over to p.m. meds and was discharged home.   DISCHARGE PLAN:  1. Was discharged home on August 18, 2007.  2. Discharge diagnoses please see above.  3. Discharge meds:  Percocet elixir, Robaxin, Lovenox, and Medrol      Dosepak.   DIET:  Heart-healthy diet.   ACTIVITY:  Weightbearing as tolerated, right lower extremity.  Home  health PT home, health nursing, total knee protocol.  Followup 2 weeks.   DISPOSITION:  Home.   CONDITION ON DISCHARGE:  Improved.      Alexzandrew L. Perkins, P.A.C.      Ollen Gross, M.D.  Electronically Signed    ALP/MEDQ  D:  09/22/2007  T:  09/23/2007  Job:  [id number]   cc:  Gloriajean Dell. Andrey Campanile, M.D.  Fax: [phone]   Rema Fendt, NP   Bernette Redbird, M.D.  Fax: [phone]

## 2010-11-30 NOTE — Discharge Summary (Signed)
NAMEBRITTEN, Beth Peters             ACCOUNT NO.:  [id number]   MEDICAL RECORD NO.:  [id number]          PATIENT TYPE:  INP   LOCATION:  1517                         FACILITY:  [facility]   PHYSICIAN:  Alexzandrew L. Perkins, P.A.DATE OF BIRTH:  03-12-1951   DATE OF ADMISSION:  02/05/2006  DATE OF DISCHARGE:  02/10/2006                                 DISCHARGE SUMMARY   DICTATION ENDED - ONLY ADT INFORMATION      Alexzandrew L. Julien Girt, P.A.     ALP/MEDQ  D:  03/20/2006  T:  03/20/2006  Job:  [id number]

## 2010-11-30 NOTE — H&P (Signed)
Beth Peters, Beth Peters             ACCOUNT NO.:  [id number]   MEDICAL RECORD NO.:  [id number]          PATIENT TYPE:  INP   LOCATION:  NA                           FACILITY:  [facility]   PHYSICIAN:  Ollen Gross, M.D.    DATE OF BIRTH:  August 18, 1950   DATE OF ADMISSION:  02/05/2006  DATE OF DISCHARGE:                                HISTORY & PHYSICAL   CHIEF COMPLAINT:  Left knee pain.   HISTORY OF PRESENT ILLNESS:  The patient is a 60 year old female who has  been see by Dr. Lequita Halt for ongoing left knee pain.  She has known arthritis  which has been a problem for quite some time now.  She has undergone an  intraarticular injection in the past with only temporary relief.  The knee  pain is interfering with her daily activities and continues to get worse,  and she has felt she has reached the point where she wants to undergo knee  replacement.  Risks and benefits are discussed and the patient subsequently  is admitted to the hospital.   ALLERGIES:  No known drug allergies.   CURRENT MEDICATIONS:  Nexium, Celebrex, hydrochlorothiazide, Climara,  Prometrium, Advair, Mylanta.   PAST MEDICAL HISTORY:  Seasonal affect disorder in the winter months, mild  asthma, history of bronchitis, history of pneumonia, hypertension, history  of achalasia, esophageal spasms, osteopenia, history of uterine fibroids.   PAST SURGICAL HISTORY:  Tonsillectomy, right knee cartilage removal in 1962,  esophageal sphincter dilatation x 2, fibroid tumor excision, also with D&C,  meniscus repair left knee in 2004, cartilage repair again left knee in 2005,  however myotomy and fundoplication 2006.   SOCIAL HISTORY:  Married, nonsmoker, 1-2 glasses of wine a couple of days a  week, 2 children.   FAMILY HISTORY:  Father with history of heart disease, hypertension,  diabetes and cancer.  Also with stroke.  Mother with a history of heart  disease, arthritis and sister with history of hypertension.   REVIEW OF  SYSTEMS:  GENERAL:  No fevers, no night sweats.  NEURO:  No  seizures, syncope or paralysis.  RESPIRATORY:  The patient has had an upper  respiratory infection recently with some drainage, sore throat, and cough.  She had fever x3 days, but this was almost 2 weeks ago.  No fevers at this  time, no shortness of breath, cough or hemoptysis. CARDIOVASCULAR:  No chest  pain or orthopnea.  GI:  She does have a history of achalasia with  esophageal spasms.  Her medical physician told her she should not be on  Coumadin and she states that she needs to take narcotics in the liquid form,  unable to swallow large pills.  No nausea, vomiting, diarrhea, constipation,  no blood in the stool.  GU:  No discharge.  MUSCULOSKELETAL:  Left knee.   PHYSICAL EXAMINATION:  VITAL SIGNS:  Pulse 60, respiratory rate is 12, blood  pressure 118/82.  GENERAL:  This is a 60 year old white female well-nourished well-developed  no acute distress.  She is alert and oriented x3, cooperative, very pleasant  and is accompanied by her  son.  HEENT:  Normocephalic.  Atraumatic.  Pupils equal, round, and reactive to  light, oropharynx clear.  EOMs intact.  NECK:  Supple.  CHEST:  Clear.  Anterior and posterior chest walls normal.  HEART:  Regular rate and rhythm.  ABDOMEN:  Soft, nontender, bowel sounds are positive.  EXTREMITIES:  Left knee shows moderate crepitus on passive range of motion,  tender on the medial and lateral joint lines.  No instability.   IMPRESSION:  1. Osteoarthritis, left knee.  2. Seasonal affect disorder, winter months.  3. Mild asthma.  4. History of bronchitis.  5. History of pneumonia.  6. Hypertension.  7. Achalasia.  8. Esophageal spasms.  9. Osteopenia.  10.History of uterine fibroids.   PLAN:  The patient is admitted to [facility] to undergo a left  total knee arthroplasty.  Surgery will be performed by Dr. Ollen Gross.      Beth Peters, P.A.      Ollen Gross, M.D.  Electronically Signed    ALP/MEDQ  D:  02/05/2006  T:  02/05/2006  Job:  [id number]   cc:   Ollen Gross, M.D.  Fax: [phone]

## 2010-12-17 ENCOUNTER — Encounter (HOSPITAL_COMMUNITY): Payer: Managed Care, Other (non HMO)

## 2010-12-17 ENCOUNTER — Other Ambulatory Visit: Payer: Self-pay | Admitting: Obstetrics and Gynecology

## 2010-12-17 LAB — BASIC METABOLIC PANEL
Chloride: 102 mEq/L (ref 96–112)
GFR calc Af Amer: >60 mL/min (ref >60)
Potassium: 3.6 mEq/L (ref 3.5–5.1)
Sodium: 139 mEq/L (ref 135–145)

## 2010-12-17 LAB — CBC
HCT: 43.2 % (ref 36.0–46.0)
MCV: 92.5 fL (ref 78.0–100.0)
RBC: 4.67 MIL/uL (ref 3.87–5.11)
WBC: 5.6 10*3/uL (ref 4.0–10.5)

## 2010-12-24 ENCOUNTER — Ambulatory Visit (HOSPITAL_COMMUNITY)
Admission: RE | Admit: 2010-12-24 | Discharge: 2010-12-24 | Disposition: A | Discharge status: Home / Self Care | Payer: Managed Care, Other (non HMO) | Source: Ambulatory Visit | Attending: Obstetrics and Gynecology | Admitting: Obstetrics and Gynecology

## 2010-12-24 ENCOUNTER — Other Ambulatory Visit: Payer: Self-pay | Admitting: Obstetrics and Gynecology

## 2010-12-24 DIAGNOSIS — Z01818 Encounter for other preprocedural examination: Secondary | ICD-10-CM | POA: Insufficient documentation

## 2010-12-24 DIAGNOSIS — Z01812 Encounter for preprocedural laboratory examination: Secondary | ICD-10-CM | POA: Insufficient documentation

## 2010-12-24 DIAGNOSIS — N950 Postmenopausal bleeding: Secondary | ICD-10-CM | POA: Insufficient documentation

## 2010-12-24 DIAGNOSIS — N840 Polyp of corpus uteri: Secondary | ICD-10-CM | POA: Insufficient documentation

## 2010-12-25 NOTE — Op Note (Signed)
  Beth Peters, Beth Peters             ACCOUNT NO.:  [id number]  MEDICAL RECORD NO.:  [id number]  LOCATION:  [facility]                          FACILITY:  [facility]  PHYSICIAN:  Cayton Cuevas P. Mataya Kilduff, M.D.DATE OF BIRTH:  1951/06/28  DATE OF PROCEDURE: DATE OF DISCHARGE:                              OPERATIVE REPORT   PREOPERATIVE DIAGNOSIS:  Postmenopausal bleeding, endometrial polyp.  POSTOPERATIVE DIAGNOSIS:  Postmenopausal bleeding, endometrial polyp, path pending.  PROCEDURE:  Hysteroscopic resection of endometrial polyp, D and C.  SURGEON:  Bellamy Rubey P. Julyssa Kyer, MD  ANESTHESIA:  General endotracheal.  ESTIMATED BLOOD LOSS:  Minimal.  GLYCINE DEFICIT:  60 mL.  COMPLICATIONS:  None.  PROCEDURE:  The patient was taken to the operating room and after induction of adequate general endotracheal anesthesia, was placed in the dorsal lithotomy position and prepped and draped in the usual fashion. Actually, the patient was placed in the dorsal lithotomy position just prior to induction of anesthesia because she has back pain and has had bilateral knee replacements, and I wanted to be certain that she was comfortable in the lithotomy position before she went to sleep.  She was prepped and draped in the usual fashion.  Bladder drained with a red rubber catheter.  Posterior weighted and anterior Sims retractor were placed and the cervix was grasped on its anterior lip with a single- tooth tenaculum.  Paracervical block was instituted by injecting 10 mL of 1% Xylocaine at each of 3 and 9 o'clock.  The uterus sounded to 9 cm. The cervix was dilated to #31 Shawnie Pons.  The operative hysteroscope with a single loop was introduced.  The cavity could be clearly seen.  There was a 1-cm known polyp emanating from the fundus on the patient's left and a smaller one anteriorly in the fundus.  The single loop cautery was used to remove the polyps.  The scope was withdrawn.  Gentle sharp curettage was done with a  specimen and was sent to pathology with the polyps.  A final look with the hysteroscope documented photographically the clear cavity and the scope was removed and the procedure was terminated.  The instruments removed from the vagina and the patient was taken to recovery room in satisfactory condition.  Sponge and instrument counts correct.     Mishel Sans P. Shifra Swartzentruber, M.D.     CPR/MEDQ  D:  12/24/2010  T:  12/25/2010  Job:  [id number]  Electronically Signed by Meredeth Ide M.D. on 12/25/2010 01:01:55 PM

## 2011-04-04 LAB — CBC
HCT: 41.6
Hemoglobin: 14.4
MCHC: 34.5
RBC: 3.58 — ABNORMAL LOW
RBC: 4.45
RDW: 12.8
WBC: 10.6 — ABNORMAL HIGH

## 2011-04-04 LAB — COMPREHENSIVE METABOLIC PANEL
Alkaline Phosphatase: 62
BUN: 17
CO2: 30
Calcium: 9.6
GFR calc non Af Amer: >60
Glucose, Bld: 105 — ABNORMAL HIGH
Potassium: 3.8
Total Protein: 6.5

## 2011-04-04 LAB — BASIC METABOLIC PANEL
Calcium: 8.8
Chloride: 100
Creatinine, Ser: 0.78
GFR calc Af Amer: >60

## 2011-04-04 LAB — URINALYSIS, ROUTINE W REFLEX MICROSCOPIC
Glucose, UA: NEGATIVE
Ketones, ur: NEGATIVE
pH: 6.5

## 2011-04-04 LAB — TYPE AND SCREEN

## 2011-04-04 LAB — PROTIME-INR
INR: 1.1
Prothrombin Time: 12.4
Prothrombin Time: 13.9

## 2011-04-05 LAB — CBC
HCT: 26.2 — ABNORMAL LOW
HCT: 30.8 — ABNORMAL LOW
Hemoglobin: 10.6 — ABNORMAL LOW
MCV: 94.2
RBC: 2.78 — ABNORMAL LOW
WBC: 10
WBC: 6.4

## 2011-04-05 LAB — BASIC METABOLIC PANEL
GFR calc Af Amer: >60
GFR calc non Af Amer: >60
Potassium: 3.9
Sodium: 139

## 2011-04-05 LAB — PROTIME-INR
INR: 0.9
Prothrombin Time: 13.5

## 2011-07-24 DIAGNOSIS — E785 Hyperlipidemia, unspecified: Secondary | ICD-10-CM | POA: Insufficient documentation

## 2011-07-30 ENCOUNTER — Other Ambulatory Visit: Payer: Self-pay | Admitting: Obstetrics and Gynecology

## 2011-07-30 DIAGNOSIS — Z1231 Encounter for screening mammogram for malignant neoplasm of breast: Secondary | ICD-10-CM

## 2011-07-30 DIAGNOSIS — M899 Disorder of bone, unspecified: Secondary | ICD-10-CM

## 2011-07-30 DIAGNOSIS — M949 Disorder of cartilage, unspecified: Secondary | ICD-10-CM

## 2011-08-08 DIAGNOSIS — L309 Dermatitis, unspecified: Secondary | ICD-10-CM | POA: Insufficient documentation

## 2011-09-04 ENCOUNTER — Ambulatory Visit
Admission: RE | Admit: 2011-09-04 | Discharge: 2011-09-04 | Disposition: A | Discharge status: Home / Self Care | Payer: Managed Care, Other (non HMO) | Source: Ambulatory Visit | Attending: Obstetrics and Gynecology | Admitting: Obstetrics and Gynecology

## 2011-09-04 DIAGNOSIS — M949 Disorder of cartilage, unspecified: Secondary | ICD-10-CM

## 2011-09-04 DIAGNOSIS — M899 Disorder of bone, unspecified: Secondary | ICD-10-CM

## 2011-09-04 DIAGNOSIS — Z1231 Encounter for screening mammogram for malignant neoplasm of breast: Secondary | ICD-10-CM

## 2011-09-13 HISTORY — PX: ROTATOR CUFF REPAIR: SHX139

## 2011-11-14 ENCOUNTER — Other Ambulatory Visit: Payer: Self-pay | Admitting: Gastroenterology

## 2011-11-15 ENCOUNTER — Ambulatory Visit
Admission: RE | Admit: 2011-11-15 | Discharge: 2011-11-15 | Disposition: A | Discharge status: Home / Self Care | Payer: Managed Care, Other (non HMO) | Source: Ambulatory Visit | Attending: Gastroenterology | Admitting: Gastroenterology

## 2012-10-07 DIAGNOSIS — M069 Rheumatoid arthritis, unspecified: Secondary | ICD-10-CM | POA: Insufficient documentation

## 2012-10-07 DIAGNOSIS — M2550 Pain in unspecified joint: Secondary | ICD-10-CM | POA: Insufficient documentation

## 2012-10-07 DIAGNOSIS — M1990 Unspecified osteoarthritis, unspecified site: Secondary | ICD-10-CM | POA: Insufficient documentation

## 2012-10-09 ENCOUNTER — Encounter: Payer: Self-pay | Admitting: Gynecology

## 2012-10-09 ENCOUNTER — Telehealth: Payer: Self-pay | Admitting: Obstetrics and Gynecology

## 2012-10-09 NOTE — Telephone Encounter (Signed)
Pt has begun bleeding after not having a period for a year. She feels as if "body parts are not where they are supposed to be." Something large and bulging in her vagina and she cannot figure out what it is.

## 2012-10-09 NOTE — Telephone Encounter (Signed)
Patient called stated she has had  Something bulge out of her vagina now.she states she was able to gently push it back up in her. States she did have some bleeding last week for day and 1/2 with breast tenderness . pateint given app.t for Dr. Farrel Gobble on Monday @ 11:00am, instructed to stay off of her feet with feet and legs propped up.  Instructed to go to ER or Urgent Care if symptoms persist. Beth Peters

## 2012-10-12 ENCOUNTER — Encounter: Payer: Self-pay | Admitting: Gynecology

## 2012-10-12 ENCOUNTER — Other Ambulatory Visit: Payer: Self-pay | Admitting: Gynecology

## 2012-10-12 ENCOUNTER — Ambulatory Visit (INDEPENDENT_AMBULATORY_CARE_PROVIDER_SITE_OTHER): Payer: Managed Care, Other (non HMO) | Admitting: Gynecology

## 2012-10-12 VITALS — BP 112/60 | Wt 161.0 lb

## 2012-10-12 DIAGNOSIS — N950 Postmenopausal bleeding: Secondary | ICD-10-CM

## 2012-10-12 DIAGNOSIS — N816 Rectocele: Secondary | ICD-10-CM

## 2012-10-12 DIAGNOSIS — R1310 Dysphagia, unspecified: Secondary | ICD-10-CM

## 2012-10-12 NOTE — Patient Instructions (Addendum)

## 2012-10-12 NOTE — Progress Notes (Signed)
Subjective:     Patient ID: Beth Peters, female   DOB: 04-08-1951, 62 y.o.   MRN: [id number]  HPI Comments: 63 yo postmenopausal female presents for vaginal bulge noted with bowel movement that required vaginal stenting.  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  Pt also reports vaginal bleeding that started 3/11, lasting for 2 days followed with several days of spotting- 5 days.  Both bleeding and spotting were bright red.  Pt denies bleeding after.  Pt is on HRT- vivelle dat 0.075 increased from 0.05, change of dose December 2013.  Prometrium 100mg  qhs.  Pt has a history of endometrial polyps- D&C hysteroscopy- 2004, 2011, 2012.   PAST GYNECOLOGIC HISTORY   Sexually active yes  Desire for future fertility no  Current method of contraception menopausal  Current use of menopausal hormonal therapy yes vivelle dot 0.060mcg/promtrium 100mg  qhs  History of pelvic infection no  Prior pelvic surgery or procedures yes  D&C Hysteroscopy x3 for endometrial polyps   Review of Systems  Constitutional: Negative.   Gastrointestinal: Positive for constipation (better than in past) and rectal pain (some times).  Endocrine: Negative.   Genitourinary: Positive for pelvic pain (suprapubic dull ache). Negative for urgency, vaginal bleeding, vaginal pain and dyspareunia.  Hematological: Negative.   Psychiatric/Behavioral: Negative.        Objective:   Physical Exam  Constitutional: She is oriented to person, place, and time and well-developed, well-nourished, and in no distress.  Neck: Normal range of motion.  Cardiovascular: Normal rate and regular rhythm.   Pulmonary/Chest: Effort normal and breath sounds normal.  Abdominal: Soft. She exhibits no distension. There is no tenderness.  Neurological: She is alert and oriented to person, place, and time.   Physical Exam  Constitutional: She is oriented to person, place, and time and well-developed, well-nourished, and in no distress.   Neck: Normal range of motion.  Cardiovascular: Normal rate and regular rhythm.   Pulmonary/Chest: Effort normal and breath sounds normal.  Abdominal: Soft. She exhibits no distension. There is no tenderness.  Neurological: She is alert and oriented to person, place, and time.  Pelvic: External genitalia:  no lesions and labia split by rectocele without valsalva              Urethra: normal appearing urethra with no masses, tenderness or lesions              Bartholins and Skenes: normal                 Vagina: PELVIC FLOOR EXAM: rectocele grade II with valsalva, cystocele grade I with valslva, exam done supine only              Cervix: normal appearance        Bimanual Exam:  Uterus:  uterus is normal size, shape, consistency and nontender                                      Adnexa:    not indicated and no masses                                      Rectovaginal: Confirms, full of hard stool  Anus:  normal sphincter tone, no lesions    Assessment:    History of postmenopasusal bleeding in past- with recurrent endometrial polyps on hysteroscopy with new onset vaginal bleeding on HRT History of chronic constipation with rectocele        Plan:    1. endometrial biopsy:  Procedure outlined to patient, risks of bleeding, infection reviewed and accepted, pt consents.  Procedure:  Cervix treated with xylocaine jelly 2%, cleansed with betadine, pipelle advanced through cervix to 9cm, 2 passes for moderate tissue was obatined.  Tolerated well, will contact with results. 2.  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  Good perineal support.  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  Briefly discussed repair and need to avoid excessive straining afterwards.  She is agreeable, we will triage after biopsy results.  Pt agreeable, all questions  addressed   An after visit summary was provided for the patient.

## 2012-10-13 ENCOUNTER — Telehealth: Payer: Self-pay | Admitting: Gynecology

## 2012-10-13 NOTE — Telephone Encounter (Signed)
Pt had a bx yesterday. She wants to know if she is allowed to do her water aerobics tomorrow (4/2). Pt said you can leave a message on her phone letting her know if she is allowed to swim. Pt has not had any abnormal symptoms; minor bleeding yesterday but none since.

## 2012-10-13 NOTE — Telephone Encounter (Signed)
Per Dr Farrel Gobble, patient may resume water aerobic class, LM on VM per patient instructions. Call if questions.

## 2012-10-14 DIAGNOSIS — N816 Rectocele: Secondary | ICD-10-CM | POA: Insufficient documentation

## 2012-10-14 DIAGNOSIS — R1310 Dysphagia, unspecified: Secondary | ICD-10-CM | POA: Insufficient documentation

## 2012-10-15 NOTE — Telephone Encounter (Signed)
pt reports she is still bleeding (pmb)/no bm since 10/12/12--also calling for results/[place]

## 2012-10-15 NOTE — Telephone Encounter (Signed)
Patient reports that has started again with bright red vaginal bleeding, initially stopped after biopsy but has restarted.  Bright red but not heavy.  Reports Miralax and "usual measures" not helping with constipation.  Rectocele interfering with ability to defecate, feels stool is being "blocked" and abdomen is distended.  Concerned needs fleet enema but wonders if that will work.  Wants to proceed with surgery ASAP.  Advised of Endo Bx results and needs new dose of Minivelle to [facility] [place].Please advise.

## 2012-10-16 DIAGNOSIS — M419 Scoliosis, unspecified: Secondary | ICD-10-CM | POA: Insufficient documentation

## 2012-10-16 DIAGNOSIS — IMO0002 Reserved for concepts with insufficient information to code with codable children: Secondary | ICD-10-CM | POA: Insufficient documentation

## 2012-10-18 NOTE — Telephone Encounter (Signed)
She can use enema, strongly recommend she get regular with miralax so any rectocele repair will not rip open with a hard stool, I will usually treat both pre- and post-op, we can always place a pessary if need be,  she has additional issues which make it important to make sure she is prepped pre-op, I am happy to reassess her, but we can schedule maybe early May

## 2012-10-19 NOTE — Telephone Encounter (Signed)
Spoke to pt about enema and using Miralax regularly to prevent constipation. Pt reports she used several laxatives Friday night to get relief. Pt is hoping to have surgery before May, and would like to talk to CR about it. Sched consult visit 10-21-12 at 1030 with CR per pt request.   aa

## 2012-10-21 ENCOUNTER — Other Ambulatory Visit: Payer: Self-pay | Admitting: Obstetrics and Gynecology

## 2012-10-21 ENCOUNTER — Ambulatory Visit (INDEPENDENT_AMBULATORY_CARE_PROVIDER_SITE_OTHER): Payer: Managed Care, Other (non HMO) | Admitting: Obstetrics and Gynecology

## 2012-10-21 ENCOUNTER — Encounter: Payer: Self-pay | Admitting: Obstetrics and Gynecology

## 2012-10-21 VITALS — BP 112/64 | Wt 158.0 lb

## 2012-10-21 DIAGNOSIS — N816 Rectocele: Secondary | ICD-10-CM

## 2012-10-21 MED ORDER — ESTRADIOL 0.05 MG/24HR TD PTTW: 1.0000 | MEDICATED_PATCH | TRANSDERMAL | Status: DC

## 2012-10-21 NOTE — Progress Notes (Signed)
62 yo married white female G2P2, on HRT with Minivelle 0.075 2x per week and Prometrium 100 mg qhs. Has had hysteroscopic resection of polyps 3 times, 1610,9604, and 2012 all for post meno bleeding.  Last month patient had breast tenderness, then started vag bleeding for 1 1/2 days with BRB after having no bleeding for a whole year before that. Had an endometrial biopsy by Dr. Farrel Gobble which was benign and showed proliferative endometrium.  Her dose of Minivelle was increased Dec 2013 due to hot flashes.   Came in today mostly because she wants to have something done about her rectocele.  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 Matthias Hughs and he has her on miralax but that sometimes gives her diarrhea. But she hasn't used that in several weeks.  Had stretching of her esophagus in Sept 2013 at [facility].    Exam:  WF in NAD              Abd soft , NT, no masses   Pelvic:  Ext nl.  Vag with gr 1 rectocele, no cystocele, no other lesions.  Cx n.  BUS neg.  BM:  Good rectal sphincter tone.  No stool in vault currently.  Uterus mid, small, NT, mobile.  Adnexa neg.  A:  Rectocele and recurrent PMB  P:  Discussed option for TVH/rectocele repair with patient.  I am reluctant to do rectocele repair when pt's constipation is under such poor control.  I fear continued constipation would just damage the repair.  I rec: meticulous attn to Kegel's and miralax and exercise, and if that really is unsatisfactory in terms of symptoms of rectocele, will then consider surgery.  Pt agrees.  Will decrease her Minivelle back to 0.05 mg to try to decrease the chance of PMB.

## 2012-10-21 NOTE — Patient Instructions (Signed)
Do Kegel's religiously, and do your absolute best to keep stools soft.  If/when you are ready for surgery, call.

## 2012-10-30 ENCOUNTER — Other Ambulatory Visit: Payer: Self-pay

## 2012-10-30 DIAGNOSIS — Z1231 Encounter for screening mammogram for malignant neoplasm of breast: Secondary | ICD-10-CM

## 2012-11-02 ENCOUNTER — Encounter: Payer: Self-pay | Admitting: *Deleted

## 2012-11-02 ENCOUNTER — Telehealth: Payer: Self-pay | Admitting: *Deleted

## 2012-11-02 NOTE — Telephone Encounter (Signed)
Call to patient to follow up on surgical date preferences.  Patient states she has talked with Dr Tresa Res, who she normally sees, regarding doing hysterectomy at same time.  After considering all her options, cost recovery time and her own schedule (has an anniversary trip in June) she has decided not to have surgery at this time and she will call back if/when she wants to schedule.

## 2013-02-16 ENCOUNTER — Emergency Department (HOSPITAL_COMMUNITY)
Admission: EM | Admit: 2013-02-16 | Discharge: 2013-02-17 | Disposition: A | Discharge status: Home / Self Care | Payer: Managed Care, Other (non HMO) | Attending: Emergency Medicine | Admitting: Emergency Medicine

## 2013-02-16 ENCOUNTER — Emergency Department (HOSPITAL_COMMUNITY): Payer: Managed Care, Other (non HMO)

## 2013-02-16 ENCOUNTER — Encounter (HOSPITAL_COMMUNITY): Payer: Self-pay | Admitting: Emergency Medicine

## 2013-02-16 DIAGNOSIS — Z9889 Other specified postprocedural states: Secondary | ICD-10-CM | POA: Insufficient documentation

## 2013-02-16 DIAGNOSIS — Z8659 Personal history of other mental and behavioral disorders: Secondary | ICD-10-CM | POA: Insufficient documentation

## 2013-02-16 DIAGNOSIS — Z8739 Personal history of other diseases of the musculoskeletal system and connective tissue: Secondary | ICD-10-CM | POA: Insufficient documentation

## 2013-02-16 DIAGNOSIS — Z8719 Personal history of other diseases of the digestive system: Secondary | ICD-10-CM | POA: Insufficient documentation

## 2013-02-16 DIAGNOSIS — R112 Nausea with vomiting, unspecified: Secondary | ICD-10-CM | POA: Insufficient documentation

## 2013-02-16 DIAGNOSIS — M129 Arthropathy, unspecified: Secondary | ICD-10-CM | POA: Insufficient documentation

## 2013-02-16 DIAGNOSIS — Z862 Personal history of diseases of the blood and blood-forming organs and certain disorders involving the immune mechanism: Secondary | ICD-10-CM | POA: Insufficient documentation

## 2013-02-16 DIAGNOSIS — E7800 Pure hypercholesterolemia, unspecified: Secondary | ICD-10-CM | POA: Insufficient documentation

## 2013-02-16 DIAGNOSIS — R109 Unspecified abdominal pain: Secondary | ICD-10-CM

## 2013-02-16 DIAGNOSIS — R197 Diarrhea, unspecified: Secondary | ICD-10-CM | POA: Insufficient documentation

## 2013-02-16 DIAGNOSIS — R1084 Generalized abdominal pain: Secondary | ICD-10-CM | POA: Insufficient documentation

## 2013-02-16 DIAGNOSIS — Z79899 Other long term (current) drug therapy: Secondary | ICD-10-CM | POA: Insufficient documentation

## 2013-02-16 DIAGNOSIS — K5900 Constipation, unspecified: Secondary | ICD-10-CM | POA: Insufficient documentation

## 2013-02-16 DIAGNOSIS — Z8679 Personal history of other diseases of the circulatory system: Secondary | ICD-10-CM | POA: Insufficient documentation

## 2013-02-16 LAB — COMPREHENSIVE METABOLIC PANEL
ALT: 22 U/L (ref 0–35)
CO2: 27 mEq/L (ref 19–32)
Calcium: 8.9 mg/dL (ref 8.4–10.5)
Chloride: 99 mEq/L (ref 96–112)
GFR calc Af Amer: >90 mL/min (ref >90)
GFR calc non Af Amer: 89 mL/min — ABNORMAL LOW (ref >90)
Glucose, Bld: 122 mg/dL — ABNORMAL HIGH (ref 70–99)
Sodium: 136 mEq/L (ref 135–145)
Total Bilirubin: 0.5 mg/dL (ref 0.3–1.2)

## 2013-02-16 LAB — CBC WITH DIFFERENTIAL/PLATELET
Eosinophils Relative: 0 % (ref 0–5)
HCT: 42.6 % (ref 36.0–46.0)
Lymphocytes Relative: 5 % — ABNORMAL LOW (ref 12–46)
Lymphs Abs: 0.5 10*3/uL — ABNORMAL LOW (ref 0.7–4.0)
MCV: 90.6 fL (ref 78.0–100.0)
Monocytes Absolute: 0.3 10*3/uL (ref 0.1–1.0)
Neutro Abs: 8.2 10*3/uL — ABNORMAL HIGH (ref 1.7–7.7)
Platelets: 160 10*3/uL (ref 150–400)
RBC: 4.7 MIL/uL (ref 3.87–5.11)
WBC: 9.1 10*3/uL (ref 4.0–10.5)

## 2013-02-16 LAB — URINALYSIS, ROUTINE W REFLEX MICROSCOPIC
Bilirubin Urine: NEGATIVE
Ketones, ur: NEGATIVE mg/dL
Nitrite: NEGATIVE
Protein, ur: NEGATIVE mg/dL
Specific Gravity, Urine: 1.018 (ref 1.005–1.030)
Urobilinogen, UA: 1 mg/dL (ref 0.0–1.0)

## 2013-02-16 LAB — URINE MICROSCOPIC-ADD ON

## 2013-02-16 MED ORDER — MORPHINE SULFATE 4 MG/ML IJ SOLN
4.0000 mg | Freq: Once | INTRAMUSCULAR | Status: AC
  Administered 2013-02-16: 4 mg via INTRAVENOUS
  Filled 2013-02-16: qty 1

## 2013-02-16 MED ORDER — POTASSIUM CHLORIDE 10 MEQ/100ML IV SOLN: 10.0000 meq | Freq: Once | INTRAVENOUS | Status: DC

## 2013-02-16 MED ORDER — ONDANSETRON HCL 4 MG/2ML IJ SOLN
4.0000 mg | Freq: Once | INTRAMUSCULAR | Status: AC
  Administered 2013-02-16: 4 mg via INTRAVENOUS
  Filled 2013-02-16: qty 2

## 2013-02-16 MED ORDER — SODIUM CHLORIDE 0.9 % IV BOLUS (SEPSIS)
1000.0000 mL | Freq: Once | INTRAVENOUS | Status: AC
  Administered 2013-02-16: 1000 mL via INTRAVENOUS

## 2013-02-16 MED ORDER — SODIUM CHLORIDE 0.9 % IV SOLN
Freq: Once | INTRAVENOUS | Status: AC
  Administered 2013-02-16: 22:00:00 via INTRAVENOUS

## 2013-02-16 MED ORDER — IOHEXOL 300 MG/ML  SOLN
50.0000 mL | Freq: Once | INTRAMUSCULAR | Status: AC | PRN
  Administered 2013-02-16: 50 mL via ORAL

## 2013-02-16 MED ORDER — IOHEXOL 300 MG/ML  SOLN
100.0000 mL | Freq: Once | INTRAMUSCULAR | Status: AC | PRN
  Administered 2013-02-16: 100 mL via INTRAVENOUS

## 2013-02-16 MED ORDER — POTASSIUM CHLORIDE 10 MEQ/100ML IV SOLN
10.0000 meq | INTRAVENOUS | Status: AC
  Administered 2013-02-16 (×2): 10 meq via INTRAVENOUS
  Filled 2013-02-16 (×2): qty 100

## 2013-02-16 NOTE — ED Provider Notes (Signed)
CSN: [id number]     Arrival date & time 02/16/13  2031 History     First MD Initiated Contact with Patient 02/16/13 2043     Chief Complaint  Patient presents with  . Abdominal Pain   (Consider location/radiation/quality/duration/timing/severity/associated sxs/prior Treatment) HPI Comments: Patient with a history of Hiatal Hernia and Achalasia presents today with a chief complaint of generalized abdominal pain, abdominal distension, nausea, and vomiting.  She reports that the pain has been present since yesterday and has been gradually worsening.  She also feels that her abdomen has been more distended, but that the distension is improving.  She reports that her last regular bowel movement was three days ago.  Yesterday she started having loose stool.  She reports that she has had several episodes of diarrhea today.  Last evening around 11 PM she also began vomiting, which has continued today.  She denies any blood in her emesis or blood in her stool.   She called the office of her Gastroenterologist earlier today and was told to come to the ED to rule out an obstruction.  She has had dilation of her Esophagus performed in teh past for Achalasia, but denies any other abdominal surgeries.  She denies fever or chills.    The history is provided by the patient.    Past Medical History  Diagnosis Date  . Anemia   . Achalasia     and defacatory difficulty  . Phlebitis, superficial 1996    x 4  . Raynauds syndrome   . Scoliosis of thoracic spine 8/01  . 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 Anxiety 3/03    with panic disorder  . Depression 12/04    seasonal  . Hypercholesterolemia 2009  . Hiatal hernia   . Arthritis 10/16/12    hands, wrist, neck ,back   Past Surgical History  Procedure Laterality Date  . Tonsillectomy and adenoidectomy  childhood  . Esophageal dilation      achalasia  . Knee surgery    . Myotomy  2/06    Hiliar myotomy and repair-hiatal hernia  . Total knee  arthroplasty Left 7/07  . Total knee arthroplasty Right 1/09  . Varicose vein surgery  3/07  . Rotator cuff repair Right 09/2011  . Hysteroscopy  2009  . Hysteroscopy  2011    Benign polyps  . Hysteroscopy  2012    Benign polyps  . Other surgical history  9/13    Esophagael Dilitation   Family History  Problem Relation Age of Onset  . Heart disease Mother   . Stroke Mother     Minor  . Osteoporosis Mother   . Hypertension Father   . Heart disease Father   . Stroke Father   . Cancer Father     Testicular, Bladder, Prostate, Lung  . Cancer Sister 27    uterine  . Diabetes Maternal Grandfather   . Heart disease Maternal Grandfather   . Diabetes Paternal Grandmother    History  Substance Use Topics  . Smoking status: Never Smoker   . Smokeless tobacco: Never Used  . Alcohol Use: 0.0 oz/week    2-3 Glasses of wine per week     Comment: White wine   OB History   Grav Para Term Preterm Abortions TAB SAB Ect Mult Living   2 2 2       2      Review of Systems  Constitutional: Negative for fever and chills.  Gastrointestinal: Positive for nausea,  vomiting, abdominal pain, constipation and abdominal distention.  All other systems reviewed and are negative.    Allergies  Dilaudid  Home Medications   Current Outpatient Rx  Name  Route  Sig  Dispense  Refill  . buPROPion (WELLBUTRIN XL) 150 MG 24 hr tablet   Oral   Take 150 mg by mouth daily.         . Calcium-Vitamin D-Vitamin K (VIACTIV) 500-500-40 MG-UNT-MCG CHEW   Oral   Chew 1 each by mouth daily.         . celecoxib (CELEBREX) 200 MG capsule   Oral   Take 200 mg by mouth daily.         Marland Kitchen esomeprazole (NEXIUM) 40 MG capsule   Oral   Take 40 mg by mouth daily before breakfast.         . estradiol (MINIVELLE) 0.05 MG/24HR   Transdermal   Place 1 patch (0.05 mg total) onto the skin 2 (two) times a week. Apply anywhere on lower abdomen.  Change patch on Sun am and Wed pm.   24 patch   3   .  Fluticasone-Salmeterol (ADVAIR HFA IN)   Inhalation   Inhale 2 puffs into the lungs daily.          . hydrochlorothiazide (HYDRODIURIL) 12.5 MG tablet   Oral   Take 12.5 mg by mouth daily.          Marland Kitchen KRILL OIL 1000 MG CAPS   Oral   Take 1 capsule by mouth daily. MegaRed         . Multiple Vitamin (MULTIVITAMIN) tablet   Oral   Take 1 tablet by mouth daily.         Marland Kitchen oxyCODONE-acetaminophen (ROXICET) 5-325 MG/5ML solution   Oral   Take 5 mLs by mouth every 4 (four) hours as needed for pain.         . progesterone (PROMETRIUM) 100 MG capsule   Oral   Take 100 mg by mouth daily.         . rosuvastatin (CRESTOR) 10 MG tablet   Oral   Take 10 mg by mouth daily.          BP 150/73  Pulse 74  Temp(Src) 98.9 F (37.2 C) (Oral)  Resp 18  SpO2 100% Physical Exam  Nursing note and vitals reviewed. Constitutional: She appears well-developed and well-nourished. No distress.  HENT:  Head: Normocephalic and atraumatic.  Mouth/Throat: Oropharynx is clear and moist.  Neck: Normal range of motion. Neck supple.  Cardiovascular: Normal rate, regular rhythm and normal heart sounds.   Pulmonary/Chest: Effort normal and breath sounds normal.  Abdominal: Soft. Bowel sounds are normal. She exhibits distension. She exhibits no mass. There is generalized tenderness. There is no rebound and no guarding.  Neurological: She is alert.  Skin: Skin is warm and dry. She is not diaphoretic.  Psychiatric: She has a normal mood and affect.    ED Course   Procedures (including critical care time)  Labs Reviewed  CBC WITH DIFFERENTIAL - Abnormal; Notable for the following:    Neutrophils Relative % 91 (*)    Neutro Abs 8.2 (*)    Lymphocytes Relative 5 (*)    Lymphs Abs 0.5 (*)    All other components within normal limits  COMPREHENSIVE METABOLIC PANEL - Abnormal; Notable for the following:    Potassium 2.8 (*)    Glucose, Bld 122 (*)    GFR calc non Af Amer 89 (*)  All  other components within normal limits  URINALYSIS, ROUTINE W REFLEX MICROSCOPIC - Abnormal; Notable for the following:    APPearance CLOUDY (*)    Hgb urine dipstick TRACE (*)    Leukocytes, UA TRACE (*)    All other components within normal limits  URINE MICROSCOPIC-ADD ON - Abnormal; Notable for the following:    Squamous Epithelial / LPF FEW (*)    All other components within normal limits   Ct Abdomen Pelvis W Contrast  02/16/2013   *RADIOLOGY REPORT*  Clinical Data: Abdominal pain, nausea vomiting.  Possible small bowel obstruction.  CT ABDOMEN AND PELVIS WITH CONTRAST  Technique:  Multidetector CT imaging of the abdomen and pelvis was performed following the standard protocol during bolus administration of intravenous contrast.  Contrast: OMNIPAQUE IOHEXOL 300 MG/ML  SOLN, 50mL OMNIPAQUE IOHEXOL 300 MG/ML  SOLN  Comparison: Single view of the abdomen 11/15/2011.  Findings: Contrast material is present in the distal esophagus consistent with poor motility and/or reflux disease.  There is no pleural or pericardial effusion.  Lung bases are clear.  The gallbladder, liver, spleen, adrenal glands, kidneys and pancreas appear normal.  There is no small bowel obstruction.  The stomach, small and large bowel and appendix appear normal.  There is no lymphadenopathy or fluid.  No focal bony abnormality is identified.  Loss of disc space height and trace retrolisthesis are seen at L2-3.  Facet arthropathy causes trace anterolisthesis of L4 and L5.  IMPRESSION:  1.  Negative for bowel obstruction.  No acute finding.  2.  Findings compatible with gastroesophageal reflux and/or poor motility.  3.  Lumbar degenerative disease.   Original Report Authenticated By: Holley Dexter, M.D.   No diagnosis found.  Patient discussed with Dr. Silverio Lay  12:09 AM Reassessed patient.  She reports that her pain is controlled at this time and that her nausea is also controlled.  Patient able to tolerate PO liquids. MDM   Patient presenting with generalized abdominal pain, nausea, vomiting, and diarrhea.  Labs unremarkable aside from Hypokalemia.  Patient given Potassium supplementation in the ED.  CT results as above do not show any acute findings.  Pain improved during ED course.  Patient able to tolerate PO liquids.  Therefore, feel that the patient is stable for discharge.  Patient instructed to follow up with her Gastroenterologist.  Return precautions given.  Pascal Lux [place], PA-C 02/18/13 [phone]

## 2013-02-16 NOTE — ED Notes (Signed)
Pt presents to ED with c/o abdominal pain with nausea and vomiting.  Pt reports that she went to her gastroenterologist today for evaluation--- was advised to come to ED for "bowel obstruction".  Pt reports that she has been having emesis every 30 minutes since 2300 yesterday night; pt's abdomen is tender and distended.

## 2013-02-17 MED ORDER — ONDANSETRON 4 MG PO TBDP: 4.0000 mg | ORAL_TABLET | Freq: Three times a day (TID) | ORAL | Status: DC | PRN

## 2013-02-17 MED ORDER — OXYCODONE-ACETAMINOPHEN 5-325 MG/5ML PO SOLN: 5.0000 mL | ORAL | Status: DC | PRN

## 2013-02-17 MED ORDER — METOCLOPRAMIDE HCL 10 MG PO TABS: 10.0000 mg | ORAL_TABLET | Freq: Four times a day (QID) | ORAL | Status: DC

## 2013-02-19 NOTE — ED Provider Notes (Signed)
Medical screening examination/treatment/procedure(s) were performed by non-physician practitioner and as supervising physician I was immediately available for consultation/collaboration.   Camreigh Michie H Bayden Gil, MD 02/19/13 1501 

## 2013-05-04 ENCOUNTER — Ambulatory Visit
Admission: RE | Admit: 2013-05-04 | Discharge: 2013-05-04 | Disposition: A | Discharge status: Home / Self Care | Payer: Managed Care, Other (non HMO) | Source: Ambulatory Visit

## 2013-05-04 DIAGNOSIS — Z1231 Encounter for screening mammogram for malignant neoplasm of breast: Secondary | ICD-10-CM

## 2013-05-14 ENCOUNTER — Other Ambulatory Visit: Payer: Self-pay | Admitting: Obstetrics and Gynecology

## 2013-05-14 NOTE — Telephone Encounter (Signed)
RF done for 90 days.  I am sorry but I do not have any recommendations for her for MDs in [place], [place].

## 2013-05-14 NOTE — Telephone Encounter (Signed)
Informed patient that her AEX was due 8/14. Aware we have seen her since, but not for AEX. States she had appointment scheduled in 9/14 with Dr Tresa Res, but was cancelled by our office. Her husband has been transferred to [place], were he is now. She is in the process of moving and hopes to be out in the next couple of months. She will be looking for new provider the minute she gets there. Her problem is that she does not have any insurance and not able to afford AEX. Please advise next step. She is aware we cannot refill her medication long term, but she does not want to stop taking them either. Also, would like to know if anyone has MD recommendations in [place], [place].

## 2013-06-26 ENCOUNTER — Other Ambulatory Visit: Payer: Self-pay | Admitting: Obstetrics & Gynecology

## 2013-07-02 ENCOUNTER — Other Ambulatory Visit: Payer: Self-pay | Admitting: Obstetrics & Gynecology

## 2014-05-16 ENCOUNTER — Encounter (HOSPITAL_COMMUNITY): Payer: Self-pay | Admitting: Emergency Medicine

## 2017-05-12 ENCOUNTER — Other Ambulatory Visit: Payer: Self-pay | Admitting: Gastroenterology

## 2017-05-12 DIAGNOSIS — K220 Achalasia of cardia: Secondary | ICD-10-CM

## 2017-05-12 DIAGNOSIS — R1310 Dysphagia, unspecified: Secondary | ICD-10-CM

## 2017-05-13 ENCOUNTER — Ambulatory Visit (INDEPENDENT_AMBULATORY_CARE_PROVIDER_SITE_OTHER): Payer: PPO | Admitting: Family Medicine

## 2017-05-13 ENCOUNTER — Encounter: Payer: Self-pay | Admitting: Family Medicine

## 2017-05-13 VITALS — BP 116/64 | HR 72 | Temp 98.3°F | Ht 63.0 in | Wt 158.6 lb

## 2017-05-13 DIAGNOSIS — M810 Age-related osteoporosis without current pathological fracture: Secondary | ICD-10-CM

## 2017-05-13 DIAGNOSIS — I10 Essential (primary) hypertension: Secondary | ICD-10-CM | POA: Diagnosis not present

## 2017-05-13 DIAGNOSIS — E559 Vitamin D deficiency, unspecified: Secondary | ICD-10-CM | POA: Diagnosis not present

## 2017-05-13 DIAGNOSIS — Z23 Encounter for immunization: Secondary | ICD-10-CM | POA: Diagnosis not present

## 2017-05-13 NOTE — Progress Notes (Signed)
Beth Peters is a 66 y.o. female is here to [place].   Patient Care Team: Briscoe Deutscher, DO as PCP - General (Family Medicine)   History of Present Illness:   HPI:   1. Osteoporosis without current pathological fracture. Hx of achalasia. Unable to tolerate Fosamax. Interested in alternative. Exercises regularly, including weight-bearing. Takes calcium/vitamin D.    2. Essential hypertension. She has been off of HCTZ for several weeks. No edema. Cardiovascular ROS: no chest pain or dyspnea on exertion.   Health Maintenance Due  Topic Date Due  . Hepatitis C Screening  08-12-1950  . HIV Screening  04/28/1967  . TETANUS/TDAP  04/28/1971  . PAP SMEAR  10/18/2012  . COLONOSCOPY  08/15/2013  . INFLUENZA VACCINE  02/12/2017  . PNA vac Low Risk Adult (1 of 2 - PCV13) 04/27/2017   No flowsheet data found. PMHx, SurgHx, SocialHx, Medications, and Allergies were reviewed in the Visit Navigator and updated as appropriate.   Past Medical History:  Diagnosis Date  . Achalasia    and defacatory difficulty  . Anemia   . Hiatal hernia   . Hypercholesterolemia 2009  . 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 Panic disorder   . Polyarthralgia   . Raynauds syndrome   . Scoliosis of thoracic spine 8/01  . Seasonal affective disorder [facility])     Past Surgical History:  Procedure Laterality Date  . ESOPHAGEAL DILATION     Achalasia  . HYSTEROSCOPY  2009  . HYSTEROSCOPY  2011   Benign polyps  . HYSTEROSCOPY  2012   Benign polyps  . KNEE SURGERY    . MYOTOMY  2/06   Hiliar myotomy and repair-hiatal hernia  . OTHER SURGICAL HISTORY  9/13   Esophagael Dilitation  . ROTATOR CUFF REPAIR Right 09/2011  . TONSILLECTOMY AND ADENOIDECTOMY  childhood  . TOTAL KNEE ARTHROPLASTY Left 7/07  . TOTAL KNEE ARTHROPLASTY Right 1/09  . VARICOSE VEIN SURGERY  3/07    Family History  Problem Relation Age of Onset  . Hypertension Father   . Heart disease Father   . Stroke Father   .  Cancer Father        Testicular, Bladder, Prostate, Lung  . Heart disease Mother   . Stroke Mother   . Osteoporosis Mother   . Uterine cancer Sister 51  . Diabetes Maternal Grandfather   . Heart disease Maternal Grandfather   . Diabetes Paternal Grandmother    Social History  Substance Use Topics  . Smoking status: Never Smoker  . Smokeless tobacco: Never Used  . Alcohol use 0.0 oz/week    2 - 3 Glasses of wine per week     Comment: White wine   Current Medications and Allergies:   .  buPROPion (WELLBUTRIN XL) 150 MG 24 hr tablet, TAKE 1 TABLET BY MOUTH EVERY DAY, Disp: 90 tablet, Rfl: 0 .  Calcium-Vitamin D-Vitamin K (VIACTIV) 937-902-40 MG-UNT-MCG CHEW, Chew 1 each by mouth daily., Disp: , Rfl:  .  EPINEPHrine (EPIPEN 2-PAK IJ), Inject as directed., Disp: , Rfl:  .  esomeprazole (NEXIUM) 40 MG capsule, Take 40 mg by mouth daily before breakfast., Disp: , Rfl:  .  fluticasone (FLONASE) 50 MCG/ACT nasal spray, Place 1 spray into both nostrils daily as needed for allergies or rhinitis., Disp: , Rfl:  .  Fluticasone-Salmeterol (ADVAIR DISKUS) 250-50 MCG/DOSE AEPB, Inhale 1 puff into the lungs 2 (two) times daily., Disp: , Rfl:   .  KRILL OIL 1000  MG CAPS, Take 1 capsule by mouth daily. MegaRed, Disp: , Rfl:  .  Multiple Vitamin (MULTIVITAMIN) tablet, Take 1 tablet by mouth daily., Disp: , Rfl:    Allergies  Allergen Reactions  . Dilaudid [Hydromorphone Hcl] Hives, Shortness Of Breath and Itching  . Glucagon Anaphylaxis  . Wasp Venom Itching and Swelling   Review of Systems:   Pertinent items are noted in the HPI. Otherwise, ROS is negative.  Vitals:   Vitals:   05/13/17 1328  BP: 116/64  Pulse: 72  Temp: 98.3 F (36.8 C)  TempSrc: Oral  SpO2: 97%  Weight: 158 lb 9.6 oz (71.9 kg)  Height: 5\' 3"  (1.6 m)     Body mass index is 28.09 kg/m.   Physical Exam:   Physical Exam  Constitutional: She appears well-nourished.  HENT:  Head: Normocephalic and atraumatic.    Eyes: Pupils are equal, round, and reactive to light. EOM are normal.  Neck: Normal range of motion. Neck supple.  Cardiovascular: Normal rate, regular rhythm, normal heart sounds and intact distal pulses.   Pulmonary/Chest: Effort normal.  Abdominal: Soft.  Skin: Skin is warm.  Psychiatric: She has a normal mood and affect. Her behavior is normal.  Nursing note and vitals reviewed.  Assessment and Plan:   Eren was seen today for establish care.  Diagnoses and all orders for this visit:  Osteoporosis without current pathological fracture, unspecified osteoporosis type Comments: Will order Prolia. Orders: -     CBC with Differential/Platelet; Future -     Comprehensive metabolic panel; Future -     TSH; Future -     Lipid panel; Future  Essential hypertension Comments: No need for HCTZ at this time.  Orders: -     CBC with Differential/Platelet; Future -     Comprehensive metabolic panel; Future -     TSH; Future -     Lipid panel; Future  Vitamin D deficiency -     VITAMIN D 25 Hydroxy (Vit-D Deficiency, Fractures); Future  Need for immunization against influenza -     Flu Vaccine QUAD 36+ mos IM   . Reviewed expectations re: course of current medical issues. . Discussed self-management of symptoms. . Outlined signs and symptoms indicating need for more acute intervention. . Patient verbalized understanding and all questions were answered. Marland Kitchen Health Maintenance issues including appropriate healthy diet, exercise, and smoking avoidance were discussed with patient. . See orders for this visit as documented in the electronic medical record. . Patient received an After Visit Summary.  Briscoe Deutscher, DO [place] [place] 05/13/2017  Future Appointments Date Time Provider [place]  05/20/2017 8:45 AM [facility] LAB [facility] None  05/29/2017 11:00 AM [facility] DG 1 ([place]) [facility]  06/02/2017 1:00 PM Briscoe Deutscher, DO [facility] None

## 2017-05-15 ENCOUNTER — Telehealth: Payer: Self-pay | Admitting: Family Medicine

## 2017-05-15 NOTE — Telephone Encounter (Signed)
Please advise on refill.  You have not filled previously.

## 2017-05-15 NOTE — Telephone Encounter (Signed)
MEDICATION: buPROPion (WELLBUTRIN XL) 150 MG 24 hr tablet  PHARMACY:   [id number] - [place], [place] - [address] [phone] (Phone) [phone] (Fax)   IS THIS A 90 DAY SUPPLY : yes  IS PATIENT OUT OF MEDICATION: yes  IF NOT; HOW MUCH IS LEFT: n/a  LAST APPOINTMENT DATE: @10 /30/2018  NEXT APPOINTMENT DATE:@11 /12/2016  OTHER COMMENTS:    **Let patient know to contact pharmacy at the end of the day to make sure medication is ready. **  ** Please notify patient to allow 48-72 hours to process**  **Encourage patient to contact the pharmacy for refills or they can request refills through [facility]**

## 2017-05-16 ENCOUNTER — Other Ambulatory Visit: Payer: Self-pay

## 2017-05-16 MED ORDER — BUPROPION HCL ER (XL) 150 MG PO TB24: 150.0000 mg | ORAL_TABLET | Freq: Every day | ORAL | 3 refills | Status: DC

## 2017-05-16 NOTE — Telephone Encounter (Signed)
Okay refill. 

## 2017-05-16 NOTE — Telephone Encounter (Signed)
Refill sent to pharmacy.   

## 2017-05-20 ENCOUNTER — Other Ambulatory Visit (INDEPENDENT_AMBULATORY_CARE_PROVIDER_SITE_OTHER): Payer: PPO

## 2017-05-20 DIAGNOSIS — M810 Age-related osteoporosis without current pathological fracture: Secondary | ICD-10-CM | POA: Diagnosis not present

## 2017-05-20 DIAGNOSIS — E559 Vitamin D deficiency, unspecified: Secondary | ICD-10-CM | POA: Diagnosis not present

## 2017-05-20 DIAGNOSIS — I10 Essential (primary) hypertension: Secondary | ICD-10-CM

## 2017-05-20 LAB — COMPREHENSIVE METABOLIC PANEL
ALT: 20 U/L (ref 0–35)
AST: 15 U/L (ref 0–37)
Albumin: 4.2 g/dL (ref 3.5–5.2)
Alkaline Phosphatase: 63 U/L (ref 39–117)
BUN: 12 mg/dL (ref 6–23)
CO2: 31 mEq/L (ref 19–32)
Calcium: 10 mg/dL (ref 8.4–10.5)
Chloride: 103 mEq/L (ref 96–112)
Creatinine, Ser: 0.84 mg/dL (ref 0.40–1.20)
GFR: 72.31 mL/min (ref >60.00)
Glucose, Bld: 113 mg/dL — ABNORMAL HIGH (ref 70–99)
Potassium: 4 mEq/L (ref 3.5–5.1)
Sodium: 141 mEq/L (ref 135–145)
Total Bilirubin: 0.7 mg/dL (ref 0.2–1.2)
Total Protein: 6.4 g/dL (ref 6.0–8.3)

## 2017-05-20 LAB — CBC WITH DIFFERENTIAL/PLATELET
Basophils Absolute: 0 10*3/uL (ref 0.0–0.1)
Basophils Relative: 0.7 % (ref 0.0–3.0)
Eosinophils Absolute: 0.2 10*3/uL (ref 0.0–0.7)
Eosinophils Relative: 4.1 % (ref 0.0–5.0)
HCT: 44.4 % (ref 36.0–46.0)
Hemoglobin: 14.8 g/dL (ref 12.0–15.0)
Lymphocytes Relative: 41.8 % (ref 12.0–46.0)
Lymphs Abs: 2.3 10*3/uL (ref 0.7–4.0)
MCHC: 33.4 g/dL (ref 30.0–36.0)
MCV: 94.4 fl (ref 78.0–100.0)
Monocytes Absolute: 0.3 10*3/uL (ref 0.1–1.0)
Monocytes Relative: 4.8 % (ref 3.0–12.0)
Neutro Abs: 2.6 10*3/uL (ref 1.4–7.7)
Neutrophils Relative %: 48.6 % (ref 43.0–77.0)
Platelets: 197 10*3/uL (ref 150.0–400.0)
RBC: 4.7 Mil/uL (ref 3.87–5.11)
RDW: 13.1 % (ref 11.5–15.5)
WBC: 5.4 10*3/uL (ref 4.0–10.5)

## 2017-05-20 LAB — LIPID PANEL
Cholesterol: 240 mg/dL — ABNORMAL HIGH (ref 0–200)
HDL: 69.6 mg/dL (ref >39.00)
LDL Cholesterol: 142 mg/dL — ABNORMAL HIGH (ref 0–99)
NonHDL: 170.83
Total CHOL/HDL Ratio: 3
Triglycerides: 146 mg/dL (ref 0.0–149.0)
VLDL: 29.2 mg/dL (ref 0.0–40.0)

## 2017-05-20 LAB — TSH: TSH: 4.05 u[IU]/mL (ref 0.35–4.50)

## 2017-05-20 LAB — VITAMIN D 25 HYDROXY (VIT D DEFICIENCY, FRACTURES): VITD: 26.7 ng/mL — ABNORMAL LOW (ref 30.00–100.00)

## 2017-05-21 DIAGNOSIS — G8929 Other chronic pain: Secondary | ICD-10-CM | POA: Diagnosis not present

## 2017-05-21 DIAGNOSIS — M25511 Pain in right shoulder: Secondary | ICD-10-CM | POA: Diagnosis not present

## 2017-05-22 DIAGNOSIS — Z471 Aftercare following joint replacement surgery: Secondary | ICD-10-CM | POA: Diagnosis not present

## 2017-05-22 DIAGNOSIS — Z96653 Presence of artificial knee joint, bilateral: Secondary | ICD-10-CM | POA: Diagnosis not present

## 2017-05-22 DIAGNOSIS — T84028A Dislocation of other internal joint prosthesis, initial encounter: Secondary | ICD-10-CM | POA: Diagnosis not present

## 2017-05-22 DIAGNOSIS — M170 Bilateral primary osteoarthritis of knee: Secondary | ICD-10-CM | POA: Diagnosis not present

## 2017-05-28 DIAGNOSIS — M50821 Other cervical disc disorders at C4-C5 level: Secondary | ICD-10-CM | POA: Diagnosis not present

## 2017-05-28 DIAGNOSIS — M50822 Other cervical disc disorders at C5-C6 level: Secondary | ICD-10-CM | POA: Diagnosis not present

## 2017-05-28 DIAGNOSIS — M542 Cervicalgia: Secondary | ICD-10-CM | POA: Diagnosis not present

## 2017-05-28 DIAGNOSIS — M50823 Other cervical disc disorders at C6-C7 level: Secondary | ICD-10-CM | POA: Diagnosis not present

## 2017-05-29 ENCOUNTER — Telehealth: Payer: Self-pay

## 2017-05-29 ENCOUNTER — Ambulatory Visit
Admission: RE | Admit: 2017-05-29 | Discharge: 2017-05-29 | Disposition: A | Discharge status: Home / Self Care | Payer: PPO | Source: Ambulatory Visit | Attending: Gastroenterology | Admitting: Gastroenterology

## 2017-05-29 DIAGNOSIS — K219 Gastro-esophageal reflux disease without esophagitis: Secondary | ICD-10-CM | POA: Diagnosis not present

## 2017-05-29 DIAGNOSIS — K220 Achalasia of cardia: Secondary | ICD-10-CM

## 2017-05-29 DIAGNOSIS — R1310 Dysphagia, unspecified: Secondary | ICD-10-CM

## 2017-05-29 NOTE — Telephone Encounter (Signed)
Called patient and left a voicemail message asking for a return phone call. I received an approval for Prolia. It looks like the patient will be responsible for about 20%. I called to discuss this with patient and of she chooses, to schedule her for her an injection.

## 2017-05-30 NOTE — Telephone Encounter (Signed)
Patient returning phone call. I was advised by [employer] that she would speak with Roselyn Reef H to try to get clarification and give the patient a call with an update before the end of the day. Please advise.

## 2017-06-02 ENCOUNTER — Ambulatory Visit (INDEPENDENT_AMBULATORY_CARE_PROVIDER_SITE_OTHER): Payer: PPO | Admitting: Family Medicine

## 2017-06-02 ENCOUNTER — Encounter: Payer: Self-pay | Admitting: Family Medicine

## 2017-06-02 VITALS — BP 128/76 | HR 73 | Temp 98.0°F | Ht 63.0 in | Wt 157.0 lb

## 2017-06-02 DIAGNOSIS — R5383 Other fatigue: Secondary | ICD-10-CM | POA: Diagnosis not present

## 2017-06-02 DIAGNOSIS — Z0000 Encounter for general adult medical examination without abnormal findings: Secondary | ICD-10-CM | POA: Diagnosis not present

## 2017-06-02 DIAGNOSIS — R739 Hyperglycemia, unspecified: Secondary | ICD-10-CM | POA: Diagnosis not present

## 2017-06-02 LAB — T4, FREE: Free T4: 0.97 ng/dL (ref 0.60–1.60)

## 2017-06-02 LAB — POCT GLYCOSYLATED HEMOGLOBIN (HGB A1C): Hemoglobin A1C: 5.4

## 2017-06-02 LAB — TSH: TSH: 1.74 u[IU]/mL (ref 0.35–4.50)

## 2017-06-02 NOTE — Progress Notes (Signed)
Subjective:    Beth Peters is a 66 y.o. female who presents for Medicare Initial Preventive Examination.  Preventive Screening-Counseling & Management  Tobacco Social History   Tobacco Use  Smoking Status Never Smoker  Smokeless Tobacco Never Used    Current Problems (verified) Patient Active Problem List   Diagnosis Date Noted  . DDD (degenerative disc disease) 10/16/2012  . Scoliosis 10/16/2012  . Rectocele without mention of uterine prolapse 10/14/2012  . Dysphagia, unspecified(787.20) 10/14/2012  . Rheumatoid arthritis ([place]) 10/07/2012  . Arthralgia 10/07/2012  . OA (osteoarthritis) 10/07/2012  . Eczema 08/08/2011  . Hyperlipidemia 07/24/2011  . Esophageal reflux 05/24/2009  . Allergic rhinitis 05/24/2009  . Lesion of ulnar nerve 05/24/2009  . Essential hypertension 02/27/2009  . Asthma 02/27/2009   Medications Prior to Visit Current Outpatient Medications on File Prior to Visit  Medication Sig Dispense Refill  . buPROPion (WELLBUTRIN XL) 150 MG 24 hr tablet Take 1 tablet (150 mg total) by mouth daily. 90 tablet 3  . Calcium-Vitamin D-Vitamin K (VIACTIV) 580-998-33 MG-UNT-MCG CHEW Chew 1 each by mouth daily.    Marland Kitchen EPINEPHrine (EPIPEN 2-PAK IJ) Inject as directed.    Marland Kitchen esomeprazole (NEXIUM) 40 MG capsule Take 40 mg by mouth daily before breakfast.    . fluticasone (FLONASE) 50 MCG/ACT nasal spray Place 1 spray into both nostrils daily as needed for allergies or rhinitis.    . Fluticasone-Salmeterol (ADVAIR DISKUS) 250-50 MCG/DOSE AEPB Inhale 1 puff into the lungs 2 (two) times daily.    . hydrochlorothiazide (HYDRODIURIL) 12.5 MG tablet Take 12.5 mg by mouth daily.     Marland Kitchen KRILL OIL 1000 MG CAPS Take 1 capsule by mouth daily. MegaRed    . Multiple Vitamin (MULTIVITAMIN) tablet Take 1 tablet by mouth daily.     Current Medications (verified) Current Outpatient Medications  Medication Sig Dispense Refill  . buPROPion (WELLBUTRIN XL) 150 MG 24 hr tablet Take 1 tablet  (150 mg total) by mouth daily. 90 tablet 3  . Calcium-Vitamin D-Vitamin K (VIACTIV) 825-053-97 MG-UNT-MCG CHEW Chew 1 each by mouth daily.    Marland Kitchen EPINEPHrine (EPIPEN 2-PAK IJ) Inject as directed.    Marland Kitchen esomeprazole (NEXIUM) 40 MG capsule Take 40 mg by mouth daily before breakfast.    . fluticasone (FLONASE) 50 MCG/ACT nasal spray Place 1 spray into both nostrils daily as needed for allergies or rhinitis.    . Fluticasone-Salmeterol (ADVAIR DISKUS) 250-50 MCG/DOSE AEPB Inhale 1 puff into the lungs 2 (two) times daily.    . hydrochlorothiazide (HYDRODIURIL) 12.5 MG tablet Take 12.5 mg by mouth daily.     Marland Kitchen KRILL OIL 1000 MG CAPS Take 1 capsule by mouth daily. MegaRed    . Multiple Vitamin (MULTIVITAMIN) tablet Take 1 tablet by mouth daily.     Allergies (verified) Dilaudid [hydromorphone hcl]; Glucagon; and Wasp venom   PAST HISTORY  Past Medical History:  Diagnosis Date  . Achalasia    and defacatory difficulty  . Anemia   . Hiatal hernia   . Hypercholesterolemia 2009  . 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 Panic disorder   . Polyarthralgia   . Raynauds syndrome   . Scoliosis of thoracic spine 8/01  . Seasonal affective disorder [facility])    Past Surgical History:  Procedure Laterality Date  . ESOPHAGEAL DILATION     Achalasia  . HYSTEROSCOPY  2009  . HYSTEROSCOPY  2011   Benign polyps  . HYSTEROSCOPY  2012   Benign polyps  . KNEE SURGERY    .  MYOTOMY  2/06   Hiliar myotomy and repair-hiatal hernia  . OTHER SURGICAL HISTORY  9/13   Esophagael Dilitation  . ROTATOR CUFF REPAIR Right 09/2011  . TONSILLECTOMY AND ADENOIDECTOMY  childhood  . TOTAL KNEE ARTHROPLASTY Left 7/07  . TOTAL KNEE ARTHROPLASTY Right 1/09  . VARICOSE VEIN SURGERY  3/07   Family History  Problem Relation Age of Onset  . Hypertension Father   . Heart disease Father   . Stroke Father   . Cancer Father        Testicular, Bladder, Prostate, Lung  . Heart disease Mother   . Stroke Mother   . Osteoporosis  Mother   . Uterine cancer Sister 53  . Diabetes Maternal Grandfather   . Heart disease Maternal Grandfather   . Diabetes Paternal Grandmother    Social History   Tobacco Use  . Smoking status: Never Smoker  . Smokeless tobacco: Never Used  Substance Use Topics  . Alcohol use: Yes    Alcohol/week: 0.0 oz    Types: 2 - 3 Glasses of wine per week    Comment: White wine    Are there smokers in your home (other than you)? No  Risk Factors Current exercise habits: swim - 3x per week, personal trainer 1x per week, walks dog around park  Dietary issues discussed: low carbohydrate  Opioid risk: none  Cardiac risk factors: advanced age (older than 64 for men, 35 for women).  Depression Screen (Note: if answer to either of the following is "Yes", a more complete depression screening is indicated)   Over the past 2 weeks, have you felt down, depressed or hopeless? No  Over the past 2 weeks, have you felt little interest or pleasure in doing things? No  Have you lost interest or pleasure in daily life? No  Do you often feel hopeless? No  Do you cry easily over simple problems? No  Activities of Daily Living In your present state of health, do you have any difficulty performing the following activities?:  Driving? No Managing money?  No Feeding yourself? No Getting from bed to chair? No Climbing a flight of stairs? No Preparing food and eating?: No Bathing or showering? No Getting dressed: No Getting to the toilet? No Using the toilet:No Moving around from place to place: No In the past year have you fallen or had a near fall?:No   Are you sexually active?  Yes  Do you have more than one partner?  No  Hearing Difficulties: Yes Do you often ask people to speak up or repeat themselves? Yes Do you experience ringing or noises in your ears? No Do you have difficulty understanding soft or whispered voices? Yes   Do you feel that you have a problem with memory? No  Do you often  misplace items? No  Do you feel safe at home?  Yes  Cognitive Testing  Alert? Yes  Normal Appearance?Yes  Oriented to person? Yes  Place? Yes   Time? Yes  Recall of three objects?  Yes  Can perform simple calculations? Yes  Displays appropriate judgment?Yes  Can read the correct time from a watch face?Yes   Advanced Directives have been discussed with the patient? Yes  Patient Care Team: Briscoe Deutscher, DO as PCP - General (Family Medicine)  Immunization History  Administered Date(s) Administered  . Influenza, High Dose Seasonal PF 07/11/2011  . Influenza,inj,Quad PF,6+ Mos 05/13/2017  . Pneumococcal Polysaccharide-23 07/15/2005  . Td 07/15/2005  . Zoster 09/30/2012  Screening Tests Health Maintenance  Topic Date Due  . Hepatitis C Screening  Feb 08, 1951  . HIV Screening  04/28/1967  . PAP SMEAR  10/18/2012  . COLONOSCOPY  08/15/2013  . TETANUS/TDAP  07/16/2015  . PNA vac Low Risk Adult (1 of 2 - PCV13) 04/27/2017  . MAMMOGRAM  08/20/2018  . INFLUENZA VACCINE  Completed  . DEXA SCAN  Completed   All answers were reviewed with the patient and necessary referrals were made:  Briscoe Deutscher, DO   06/08/2017   History reviewed: allergies, current medications, past family history, past medical history, past social history, past surgical history and problem list  Review of Systems Pertinent items are noted in HPI.    Objective:   Vision by Snellen chart: right eye:20/20, left eye:20/20  Body mass index is 27.81 kg/m. BP 128/76   Pulse 73   Temp 98 F (36.7 C) (Oral)   Ht 5\' 3"  (1.6 m)   Wt 157 lb (71.2 kg)   LMP 09/23/2012   SpO2 98%   BMI 27.81 kg/m   General Appearance:    Alert, cooperative, no distress, appears stated age  Head:    Normocephalic, without obvious abnormality, atraumatic  Eyes:    PERRL, conjunctiva/corneas clear, EOM's intact, fundi    benign, both eyes  Ears:    Normal TM's and external ear canals, both ears  Nose:   Nares normal,  septum midline, mucosa normal, no drainage    or sinus tenderness  Throat:   Lips, mucosa, and tongue normal; teeth and gums normal  Neck:   Supple, symmetrical, trachea midline, no adenopathy;    thyroid:  no enlargement/tenderness/nodules; no carotid   bruit or JVD  Back:     Symmetric, no curvature, ROM normal, no CVA tenderness  Lungs:     Clear to auscultation bilaterally, respirations unlabored  Chest Wall:    No tenderness or deformity   Heart:    Regular rate and rhythm, S1 and S2 normal, no murmur, rub   or gallop  Abdomen:     Soft, non-tender, bowel sounds active all four quadrants,    no masses, no organomegaly  Extremities:   Extremities normal, atraumatic, no cyanosis or edema  Pulses:   2+ and symmetric all extremities  Skin:   Skin color, texture, turgor normal, no rashes or lesions  Lymph nodes:   Cervical, supraclavicular, and axillary nodes normal  Neurologic:   CNII-XII intact, normal strength, sensation and reflexes    throughout   Assessment:     Diagnoses and all orders for this visit:  Fatigue, unspecified type -     TSH -     T4, free -     Thyroid peroxidase antibody  Elevated blood sugar -     POCT glycosylated hemoglobin (Hb A1C)   Plan:     During the course of the visit the patient was educated and counseled about appropriate screening and preventive services including:    Influenza vaccine  Screening mammography  Diet review for nutrition referral? Not Indicated  Patient Instructions (the written plan) was given to the patient.  Medicare Attestation I have personally reviewed: The patient's medical and social history Their use of alcohol, tobacco or illicit drugs Their current medications and supplements The patient's functional ability including ADLs,fall risks, home safety risks, cognitive, and hearing and visual impairment Diet and physical activities Evidence for depression or mood disorders  The patient's weight, height, BMI,  and visual acuity have been recorded  in the chart.  I have made referrals, counseling, and provided education to the patient based on review of the above and I have provided the patient with a written personalized care plan for preventive services.     Briscoe Deutscher, DO   06/08/2017

## 2017-06-03 LAB — THYROID PEROXIDASE ANTIBODY: Thyroperoxidase Ab SerPl-aCnc: <1 IU/mL (ref <9)

## 2017-06-10 DIAGNOSIS — M50823 Other cervical disc disorders at C6-C7 level: Secondary | ICD-10-CM | POA: Diagnosis not present

## 2017-06-10 DIAGNOSIS — M5136 Other intervertebral disc degeneration, lumbar region: Secondary | ICD-10-CM | POA: Diagnosis not present

## 2017-06-12 DIAGNOSIS — Z800 Family history of malignant neoplasm of digestive organs: Secondary | ICD-10-CM | POA: Diagnosis not present

## 2017-06-12 DIAGNOSIS — K219 Gastro-esophageal reflux disease without esophagitis: Secondary | ICD-10-CM | POA: Diagnosis not present

## 2017-06-12 DIAGNOSIS — K222 Esophageal obstruction: Secondary | ICD-10-CM | POA: Diagnosis not present

## 2017-06-12 DIAGNOSIS — R1310 Dysphagia, unspecified: Secondary | ICD-10-CM | POA: Diagnosis not present

## 2017-06-12 DIAGNOSIS — K220 Achalasia of cardia: Secondary | ICD-10-CM | POA: Diagnosis not present

## 2017-06-17 ENCOUNTER — Ambulatory Visit (INDEPENDENT_AMBULATORY_CARE_PROVIDER_SITE_OTHER): Payer: PPO | Admitting: Family Medicine

## 2017-06-17 ENCOUNTER — Encounter: Payer: Self-pay | Admitting: Family Medicine

## 2017-06-17 VITALS — BP 130/80 | HR 72 | Temp 98.0°F | Wt 157.4 lb

## 2017-06-17 DIAGNOSIS — R059 Cough, unspecified: Secondary | ICD-10-CM

## 2017-06-17 DIAGNOSIS — R05 Cough: Secondary | ICD-10-CM

## 2017-06-17 MED ORDER — GUAIFENESIN-CODEINE 100-10 MG/5ML PO SYRP: 10.0000 mL | ORAL_SOLUTION | Freq: Every evening | ORAL | 0 refills | Status: DC | PRN

## 2017-06-17 NOTE — Progress Notes (Signed)
Beth Peters is a 66 y.o. female here for an acute visit.  History of Present Illness:   HPI: Patient presents with 3-4 days of acute onset of body aches, malaise, rhinitis, sore throat, and cough.  She denies any fevers, chills, headaches, dizziness, nausea, vomiting, diarrhea, edema, or rash.  She is not sure if she has been exposed to the flu.  Non-smoker.  History of asthma.  She has been trying over-the-counter medications without relief of her symptoms.  PMHx, SurgHx, SocialHx, Medications, and Allergies were reviewed in the Visit Navigator and updated as appropriate.  Current Medications:   .  buPROPion (WELLBUTRIN XL) 150 MG 24 hr tablet, Take 1 tablet (150 mg total) by mouth daily., Disp: 90 tablet, Rfl: 3 .  Calcium-Vitamin D-Vitamin K (VIACTIV) 132-440-10 MG-UNT-MCG CHEW, Chew 1 each by mouth daily., Disp: , Rfl:  .  EPINEPHrine (EPIPEN 2-PAK IJ), Inject as directed., Disp: , Rfl:  .  esomeprazole (NEXIUM) 40 MG capsule, Take 40 mg by mouth daily before breakfast., Disp: , Rfl:  .  fluticasone (FLONASE) 50 MCG/ACT nasal spray, Place 1 spray into both nostrils daily as needed for allergies or rhinitis., Disp: , Rfl:  .  Fluticasone-Salmeterol (ADVAIR DISKUS) 250-50 MCG/DOSE AEPB, Inhale 1 puff into the lungs 2 (two) times daily., Disp: , Rfl:  .  KRILL OIL 1000 MG CAPS, Take 1 capsule by mouth daily. MegaRed, Disp: , Rfl:  .  Multiple Vitamin (MULTIVITAMIN) tablet, Take 1 tablet by mouth daily., Disp: , Rfl:    Allergies  Allergen Reactions  . Dilaudid [Hydromorphone Hcl] Hives, Shortness Of Breath and Itching  . Glucagon Anaphylaxis  . Wasp Venom Itching and Swelling   Review of Systems:   Pertinent items are noted in the HPI. Otherwise, ROS is negative.  Vitals:   Vitals:   06/17/17 1528  BP: 130/80  Pulse: 72  Temp: 98 F (36.7 C)  TempSrc: Oral  SpO2: 98%  Weight: 157 lb 6.4 oz (71.4 kg)     Body mass index is 27.88 kg/m.   Physical Exam:    Physical Exam  Constitutional: She appears well-developed and well-nourished. She does not appear ill.  HENT:  Head: Normocephalic and atraumatic.  Right Ear: Tympanic membrane normal.  Left Ear: Tympanic membrane normal.  Mouth/Throat: Uvula is midline, oropharynx is clear and moist and mucous membranes are normal.  Eyes: EOM are normal. Pupils are equal, round, and reactive to light.  Neck: Normal range of motion. Neck supple.  Cardiovascular: Normal rate, regular rhythm, normal heart sounds and intact distal pulses.  Pulmonary/Chest: Effort normal. She has no wheezes. She has no rhonchi. She has no rales.  Abdominal: Soft.  Skin: Skin is warm.  Psychiatric: She has a normal mood and affect. Her behavior is normal.  Nursing note and vitals reviewed.  Assessment and Plan:   Beth Peters was seen today for sore throat, headache and generalized body aches.  Diagnoses and all orders for this visit:  Cough Comments: Point-of-care flu is negative today.  The patient was instructed to get plenty of rest, hydration, and to monitor symptoms.  She was given a prescription for Cheratussin as below.  She denies any reactions to codeine in the past.  Red flags were reviewed for calling in. Orders: -     guaiFENesin-codeine (CHERATUSSIN AC) 100-10 MG/5ML syrup; Take 10 mLs by mouth at bedtime as needed for cough or congestion.  . Reviewed expectations re: course of current medical issues. . Discussed self-management  of symptoms. . Outlined signs and symptoms indicating need for more acute intervention. . Patient verbalized understanding and all questions were answered. Marland Kitchen Health Maintenance issues including appropriate healthy diet, exercise, and smoking avoidance were discussed with patient. . See orders for this visit as documented in the electronic medical record. . Patient received an After Visit Summary.  Beth Deutscher, DO [place] [place] 06/17/2017  No future  appointments.

## 2017-06-17 NOTE — Telephone Encounter (Signed)
Patient came in for an appointment and wanted to discuss Prolia injection further. Can you please give patient a call about this.

## 2017-06-18 NOTE — Telephone Encounter (Signed)
Called and spoke with patient. After submitting her insurance information to [employer] and receiving her Summary of Benefits patient is responsible for 20%. I did inform her that the estimation of cost would be around $250 but could me more or less. She verbalized understanding. She is not feeling well today and asked that she be able to call and schedule injection when she feels better.

## 2017-06-19 ENCOUNTER — Telehealth: Payer: Self-pay | Admitting: Family Medicine

## 2017-06-19 DIAGNOSIS — M50823 Other cervical disc disorders at C6-C7 level: Secondary | ICD-10-CM | POA: Diagnosis not present

## 2017-06-19 DIAGNOSIS — M5136 Other intervertebral disc degeneration, lumbar region: Secondary | ICD-10-CM | POA: Diagnosis not present

## 2017-06-19 DIAGNOSIS — M50822 Other cervical disc disorders at C5-C6 level: Secondary | ICD-10-CM | POA: Diagnosis not present

## 2017-06-19 DIAGNOSIS — M50821 Other cervical disc disorders at C4-C5 level: Secondary | ICD-10-CM | POA: Diagnosis not present

## 2017-06-19 NOTE — Telephone Encounter (Signed)
Copied from [place] [id number]. Topic: Quick Communication - See Telephone Encounter >> Jun 19, 2017  9:09 AM Hewitt Shorts wrote: CRM for notification. See Telephone encounter for:pt is needing to let dr wallace know that she has decided to go ahead with the prednisone that they discussed   Best number [phone]   06/19/17.

## 2017-06-19 NOTE — Telephone Encounter (Signed)
Returned  Patients  Phone  Call  - Pt  [place]  On the  OV 06-17-2017 Dr [name]  And her had  Discussed her taking  Prednisone . Pt  States  Her  Cough is  Worse  And  Her  Throat is  Irritated . She  Is  Requesting  Prednisone  [facility]  [id number]

## 2017-06-20 MED ORDER — PREDNISONE 5 MG PO TABS
ORAL_TABLET | ORAL | 0 refills | Status: DC
Start: 2017-06-20 — End: 2017-10-28

## 2017-06-20 NOTE — Telephone Encounter (Signed)
Please advise 

## 2017-06-20 NOTE — Telephone Encounter (Signed)
Prescription sent to pharmacy and notified patient.

## 2017-06-20 NOTE — Telephone Encounter (Signed)
Prednisone 5mg , #21.

## 2017-07-24 DIAGNOSIS — M5136 Other intervertebral disc degeneration, lumbar region: Secondary | ICD-10-CM | POA: Insufficient documentation

## 2017-08-12 DIAGNOSIS — M546 Pain in thoracic spine: Secondary | ICD-10-CM

## 2017-08-12 DIAGNOSIS — M542 Cervicalgia: Secondary | ICD-10-CM | POA: Diagnosis not present

## 2017-08-12 DIAGNOSIS — G8929 Other chronic pain: Secondary | ICD-10-CM | POA: Insufficient documentation

## 2017-08-21 DIAGNOSIS — M546 Pain in thoracic spine: Secondary | ICD-10-CM | POA: Diagnosis not present

## 2017-09-04 ENCOUNTER — Telehealth: Payer: Self-pay | Admitting: Family Medicine

## 2017-09-04 ENCOUNTER — Telehealth: Payer: Self-pay | Admitting: Surgical

## 2017-09-04 NOTE — Telephone Encounter (Signed)
Left message for patient to call the office to schedule an appointment for surgical clearance. [facility] sent a surgical clearance form to the office. Surgery is set for 11/26/2017. Patient will need to be seen to have form filled out.

## 2017-09-04 NOTE — Telephone Encounter (Signed)
See note

## 2017-09-04 NOTE — Telephone Encounter (Signed)
Copied from [place] [id number]. Topic: Quick Communication - Rx Refill/Question >> Sep 04, 2017  1:33 PM Margot Ables wrote: Medication: epipen - pts will expired this month - please send in new RX - pt is about to leave for [place] and needs by Monday no later Has the patient contacted their pharmacy? No. Not refillable (Agent: If no, request that the patient contact the pharmacy for the refill.) Preferred Pharmacy (with phone number or street name): [id number] [place], [place] - [address] [place] [address] Phone: [phone] Fax: [phone]

## 2017-09-04 NOTE — Telephone Encounter (Signed)
Epipen is expiring this month.    She needs a new prescription.    She is leaving for [place] and needs it by Monday.  [id number] [address] [place]   Thanks.  Pt of Dr. [name]

## 2017-09-05 ENCOUNTER — Other Ambulatory Visit: Payer: Self-pay

## 2017-09-05 MED ORDER — EPINEPHRINE 0.3 MG/0.3ML IJ SOAJ: 0.3000 mg | Freq: Once | INTRAMUSCULAR | 0 refills | Status: AC

## 2017-09-08 ENCOUNTER — Other Ambulatory Visit: Payer: Self-pay | Admitting: *Deleted

## 2017-09-08 MED ORDER — EPINEPHRINE 0.3 MG/0.3ML IJ SOAJ: 0.3000 mg | Freq: Once | INTRAMUSCULAR | 1 refills | Status: AC

## 2017-09-08 NOTE — Telephone Encounter (Signed)
I spoke with Butch Penny since Dr. Alcario Drought team was out for the day and Dr. [name] was not in the office and Butch Penny agreed to send in the epipen for the patient [facility] [place] [place].

## 2017-09-08 NOTE — Telephone Encounter (Signed)
Discussed with Aldona Bar pt going out of town and needs new Epi pen hers expired. Aldona Bar said okay to refill. Rx sent to pharmacy.

## 2017-09-15 NOTE — Telephone Encounter (Signed)
Left message for patient to call the office to schedule appointment for surgical clearance.

## 2017-09-20 ENCOUNTER — Emergency Department (HOSPITAL_COMMUNITY)
Admission: EM | Admit: 2017-09-20 | Discharge: 2017-09-21 | Disposition: A | Discharge status: Home / Self Care | Payer: PPO | Attending: Emergency Medicine | Admitting: Emergency Medicine

## 2017-09-20 ENCOUNTER — Encounter (HOSPITAL_COMMUNITY): Payer: Self-pay

## 2017-09-20 ENCOUNTER — Encounter (HOSPITAL_COMMUNITY)
Admission: EM | Disposition: A | Discharge status: Home / Self Care | Payer: Self-pay | Source: Home / Self Care | Attending: Emergency Medicine

## 2017-09-20 ENCOUNTER — Other Ambulatory Visit: Payer: Self-pay

## 2017-09-20 DIAGNOSIS — T18128A Food in esophagus causing other injury, initial encounter: Secondary | ICD-10-CM | POA: Insufficient documentation

## 2017-09-20 DIAGNOSIS — T18108A Unspecified foreign body in esophagus causing other injury, initial encounter: Secondary | ICD-10-CM | POA: Diagnosis not present

## 2017-09-20 DIAGNOSIS — X58XXXA Exposure to other specified factors, initial encounter: Secondary | ICD-10-CM | POA: Diagnosis not present

## 2017-09-20 DIAGNOSIS — I10 Essential (primary) hypertension: Secondary | ICD-10-CM | POA: Diagnosis not present

## 2017-09-20 DIAGNOSIS — E785 Hyperlipidemia, unspecified: Secondary | ICD-10-CM | POA: Diagnosis not present

## 2017-09-20 DIAGNOSIS — J45909 Unspecified asthma, uncomplicated: Secondary | ICD-10-CM | POA: Insufficient documentation

## 2017-09-20 DIAGNOSIS — Z96653 Presence of artificial knee joint, bilateral: Secondary | ICD-10-CM | POA: Insufficient documentation

## 2017-09-20 DIAGNOSIS — Z79899 Other long term (current) drug therapy: Secondary | ICD-10-CM | POA: Diagnosis not present

## 2017-09-20 DIAGNOSIS — K219 Gastro-esophageal reflux disease without esophagitis: Secondary | ICD-10-CM | POA: Insufficient documentation

## 2017-09-20 DIAGNOSIS — K449 Diaphragmatic hernia without obstruction or gangrene: Secondary | ICD-10-CM | POA: Insufficient documentation

## 2017-09-20 DIAGNOSIS — E7800 Pure hypercholesterolemia, unspecified: Secondary | ICD-10-CM | POA: Diagnosis not present

## 2017-09-20 DIAGNOSIS — Z885 Allergy status to narcotic agent status: Secondary | ICD-10-CM | POA: Diagnosis not present

## 2017-09-20 DIAGNOSIS — K220 Achalasia of cardia: Secondary | ICD-10-CM | POA: Diagnosis not present

## 2017-09-20 DIAGNOSIS — R1310 Dysphagia, unspecified: Secondary | ICD-10-CM | POA: Diagnosis not present

## 2017-09-20 HISTORY — PX: ESOPHAGOGASTRODUODENOSCOPY: SHX5428

## 2017-09-20 SURGERY — EGD (ESOPHAGOGASTRODUODENOSCOPY)
Anesthesia: Moderate Sedation

## 2017-09-20 MED ORDER — MIDAZOLAM HCL 5 MG/ML IJ SOLN
INTRAMUSCULAR | Status: AC
Start: 2017-09-20 — End: 2017-09-20
  Filled 2017-09-20: qty 2

## 2017-09-20 MED ORDER — FENTANYL CITRATE (PF) 100 MCG/2ML IJ SOLN
INTRAMUSCULAR | Status: AC
  Filled 2017-09-20: qty 2

## 2017-09-20 MED ORDER — DIPHENHYDRAMINE HCL 50 MG/ML IJ SOLN
INTRAMUSCULAR | Status: AC
  Filled 2017-09-20: qty 1

## 2017-09-20 NOTE — ED Provider Notes (Signed)
[facility] DEPT Provider Note   CSN: [id number] Arrival date & time: 09/20/17  2208     History   Chief Complaint Chief Complaint  Patient presents with  . Foreign Body    HPI Beth Peters is a 67 y.o. female.  The history is provided by the patient and medical records.    67 year old female with history of achalasia, hyperlipidemia, panic disorder, ray nods, scoliosis, presenting to the ED for food impaction.  Patient reports she has had a piece of steak stuck in her throat for about 3 hours now.  She has had this happen numerous times.  She tried her methods of getting it to pass at home without success.  She spoke with GI doctor on call and was told to come to the ED.  She is not currently drooling, no difficulty breathing.    Past Medical History:  Diagnosis Date  . Achalasia    and defacatory difficulty  . Anemia   . Hiatal hernia   . Hypercholesterolemia 2009  . 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 Panic disorder   . Polyarthralgia   . Raynauds syndrome   . Scoliosis of thoracic spine 8/01  . Seasonal affective disorder [facility])     Patient Active Problem List   Diagnosis Date Noted  . DDD (degenerative disc disease) 10/16/2012  . Scoliosis 10/16/2012  . Rectocele without mention of uterine prolapse 10/14/2012  . Dysphagia, unspecified(787.20) 10/14/2012  . Rheumatoid arthritis ([place]) 10/07/2012  . Arthralgia 10/07/2012  . OA (osteoarthritis) 10/07/2012  . Eczema 08/08/2011  . Hyperlipidemia 07/24/2011  . Esophageal reflux 05/24/2009  . Allergic rhinitis 05/24/2009  . Lesion of ulnar nerve 05/24/2009  . Essential hypertension 02/27/2009  . Asthma 02/27/2009    Past Surgical History:  Procedure Laterality Date  . ESOPHAGEAL DILATION     Achalasia  . HYSTEROSCOPY  2009  . HYSTEROSCOPY  2011   Benign polyps  . HYSTEROSCOPY  2012   Benign polyps  . KNEE SURGERY    . MYOTOMY  2/06   Hiliar myotomy and repair-hiatal hernia   . OTHER SURGICAL HISTORY  9/13   Esophagael Dilitation  . ROTATOR CUFF REPAIR Right 09/2011  . TONSILLECTOMY AND ADENOIDECTOMY  childhood  . TOTAL KNEE ARTHROPLASTY Left 7/07  . TOTAL KNEE ARTHROPLASTY Right 1/09  . VARICOSE VEIN SURGERY  3/07    OB History    Gravida Para Term Preterm AB Living   '2 2 2     2   '$ SAB TAB Ectopic Multiple Live Births           1       Home Medications    Prior to Admission medications   Medication Sig Start Date End Date Taking? Authorizing Provider  buPROPion (WELLBUTRIN XL) 150 MG 24 hr tablet Take 1 tablet (150 mg total) by mouth daily. 05/16/17   Briscoe Deutscher, DO  Calcium-Vitamin D-Vitamin K (VIACTIV) 967-591-63 MG-UNT-MCG CHEW Chew 1 each by mouth daily.    [provider]  esomeprazole (NEXIUM) 40 MG capsule Take 40 mg by mouth daily before breakfast.    [provider]  fluticasone (FLONASE) 50 MCG/ACT nasal spray Place 1 spray into both nostrils daily as needed for allergies or rhinitis.    [provider]  Fluticasone-Salmeterol (ADVAIR DISKUS) 250-50 MCG/DOSE AEPB Inhale 1 puff into the lungs 2 (two) times daily.    [provider]  guaiFENesin-codeine (CHERATUSSIN AC) 100-10 MG/5ML syrup Take 10 mLs by  mouth at bedtime as needed for cough or congestion. 06/17/17   Briscoe Deutscher, DO  hydrochlorothiazide (HYDRODIURIL) 12.5 MG tablet Take 12.5 mg by mouth daily.  08/10/12   [provider]  KRILL OIL 1000 MG CAPS Take 1 capsule by mouth daily. MegaRed    [provider]  Multiple Vitamin (MULTIVITAMIN) tablet Take 1 tablet by mouth daily.    [provider]  predniSONE (DELTASONE) 5 MG tablet Please take 6,5,4,3,2,1 then done. 06/20/17   Briscoe Deutscher, DO    Family History Family History  Problem Relation Age of Onset  . Hypertension Father   . Heart disease Father   . Stroke Father   . Cancer Father        Testicular, Bladder, Prostate, Lung  . Heart disease Mother   .  Stroke Mother   . Osteoporosis Mother   . Uterine cancer Sister 77  . Diabetes Maternal Grandfather   . Heart disease Maternal Grandfather   . Diabetes Paternal Grandmother     Social History Social History   Tobacco Use  . Smoking status: Never Smoker  . Smokeless tobacco: Never Used  Substance Use Topics  . Alcohol use: Yes    Alcohol/week: 0.0 oz    Types: 2 - 3 Glasses of wine per week    Comment: White wine  . Drug use: No     Allergies   Dilaudid [hydromorphone hcl]; Glucagon; and Wasp venom   Review of Systems Review of Systems  HENT: Positive for trouble swallowing.   All other systems reviewed and are negative.    Physical Exam Updated Vital Signs BP (!) 175/106 (BP Location: Right Arm)   Pulse 74   Temp 97.9 F (36.6 C) (Oral)   Resp 18   Ht '5\' 3"'$  (1.6 m)   Wt 71.2 kg (157 lb)   LMP 09/23/2012   SpO2 100%   BMI 27.81 kg/m   Physical Exam  Constitutional: She is oriented to person, place, and time. She appears well-developed and well-nourished.  HENT:  Head: Normocephalic and atraumatic.  Mouth/Throat: Oropharynx is clear and moist.  Airway patent, handling secretions well, no drooling  Eyes: Conjunctivae and EOM are normal. Pupils are equal, round, and reactive to light.  Neck: Normal range of motion.  Cardiovascular: Normal rate, regular rhythm and normal heart sounds.  Pulmonary/Chest: Effort normal and breath sounds normal.  Abdominal: Soft. Bowel sounds are normal.  Musculoskeletal: Normal range of motion.  Neurological: She is alert and oriented to person, place, and time.  Skin: Skin is warm and dry.  Psychiatric: She has a normal mood and affect.  Nursing note and vitals reviewed.    ED Treatments / Results  Labs (all labs ordered are listed, but only abnormal results are displayed) Labs Reviewed - No data to display  EKG  EKG Interpretation None       Radiology No results found.  Procedures Procedures (including  critical care time)  Medications Ordered in ED Medications - No data to display   Initial Impression / Assessment and Plan / ED Course  I have reviewed the triage vital signs and the nursing notes.  Pertinent labs & imaging results that were available during my care of the patient were reviewed by me and considered in my medical decision making (see chart for details).  Patient was met by Dr. Watt Climes with GI once placed into room.  EGD was performed here.  Patient tolerated well.  She is tolerating oral fluids after  procedure without difficulty.  Her vitals are stable.  Feel she is stable for discharge home.  She will follow-up with her GI doctor.  She understands to return here for any new or worsening symptoms.  Final Clinical Impressions(s) / ED Diagnoses   Final diagnoses:  Food impaction of esophagus, initial encounter    ED Discharge Orders    None       Larene Pickett, PA-C 09/21/17 0123    Lacretia Leigh, MD 09/21/17 [phone]

## 2017-09-20 NOTE — ED Triage Notes (Signed)
States piece of stake hung in throat for about 3 hours no respiratory or acute distress noted able to swallow spit. Clear speech noted.

## 2017-09-20 NOTE — Consult Note (Signed)
Reason for Consult: Food impaction Referring Physician: ER  Beth Peters is an 67 y.o. female.  HPI: Patient known to my partner Dr. B with a long history of achalasia status post surgery in which sounds like balloon dilation who has had multiple food impactions and has multiple ways of getting food up herself but today after dinner those methods were unsuccessful and she called me on the phone and we decided to meet here at the hospital and proceed with endoscopic removal and she is not on any blood thinners but has taken some aspirin lately but has no other complaints  Past Medical History:  Diagnosis Date  . Achalasia    and defacatory difficulty  . Anemia   . Hiatal hernia   . Hypercholesterolemia 2009  . 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 Panic disorder   . Polyarthralgia   . Raynauds syndrome   . Scoliosis of thoracic spine 8/01  . Seasonal affective disorder [facility])     Past Surgical History:  Procedure Laterality Date  . ESOPHAGEAL DILATION     Achalasia  . HYSTEROSCOPY  2009  . HYSTEROSCOPY  2011   Benign polyps  . HYSTEROSCOPY  2012   Benign polyps  . KNEE SURGERY    . MYOTOMY  2/06   Hiliar myotomy and repair-hiatal hernia  . OTHER SURGICAL HISTORY  9/13   Esophagael Dilitation  . ROTATOR CUFF REPAIR Right 09/2011  . TONSILLECTOMY AND ADENOIDECTOMY  childhood  . TOTAL KNEE ARTHROPLASTY Left 7/07  . TOTAL KNEE ARTHROPLASTY Right 1/09  . VARICOSE VEIN SURGERY  3/07    Family History  Problem Relation Age of Onset  . Hypertension Father   . Heart disease Father   . Stroke Father   . Cancer Father        Testicular, Bladder, Prostate, Lung  . Heart disease Mother   . Stroke Mother   . Osteoporosis Mother   . Uterine cancer Sister 21  . Diabetes Maternal Grandfather   . Heart disease Maternal Grandfather   . Diabetes Paternal Grandmother     Social History:  reports that  has never smoked. she has never used smokeless tobacco. She reports that she  drinks alcohol. She reports that she does not use drugs.  Allergies:  Allergies  Allergen Reactions  . Dilaudid [Hydromorphone Hcl] Hives, Shortness Of Breath and Itching  . Glucagon Anaphylaxis  . Wasp Venom Itching and Swelling    Medications: I have reviewed the patient's current medications.  No results found for this or any previous visit (from the past 48 hour(s)).  No results found.  ROS- negative except  above  Blood pressure (!) 175/106, pulse 74, temperature 97.9 F (36.6 C), temperature source Oral, resp. rate 18, height 5\' 3"  (1.6 m), weight 71.2 kg (157 lb), last menstrual period 09/23/2012, SpO2 100 %. Physical ExamVital signs stable afebrile no acute distress patient looks better than most food impactions which we discussed and sounded better on the phone which we discussed lungs clear heart regular rate and rhythm abdomen is soft nontender  Assessment/Plan: Food impaction in a patient on with known achalasia Plan: The risks of food removal and endoscopy was discussed with the patient and her husband and will proceed ASAP with further workup plans and recommendations pending those findings  [place] E 09/20/2017, 11:31 PM

## 2017-09-21 ENCOUNTER — Other Ambulatory Visit: Payer: Self-pay

## 2017-09-21 MED ORDER — MIDAZOLAM HCL 10 MG/2ML IJ SOLN
INTRAMUSCULAR | Status: DC | PRN
  Administered 2017-09-20 (×2): 2 mg via INTRAVENOUS
  Administered 2017-09-20: 1 mg via INTRAVENOUS
  Administered 2017-09-20: 2 mg via INTRAVENOUS

## 2017-09-21 MED ORDER — BUTAMBEN-TETRACAINE-BENZOCAINE 2-2-14 % EX AERO
INHALATION_SPRAY | CUTANEOUS | Status: DC | PRN
  Administered 2017-09-20: 1 via TOPICAL

## 2017-09-21 MED ORDER — FENTANYL CITRATE (PF) 100 MCG/2ML IJ SOLN
INTRAMUSCULAR | Status: DC | PRN
  Administered 2017-09-20 (×3): 25 ug via INTRAVENOUS

## 2017-09-21 NOTE — ED Notes (Signed)
Pt is able to drink water without issue.

## 2017-09-21 NOTE — Progress Notes (Signed)
EGD procedure completed in the ED. No complaints of pain. Patient is easily arousable and oriented X4. Endo RN stayed with patient in recovery for approx 25 minutes. Bedside report was given to Roney Marion, RN.

## 2017-09-21 NOTE — Op Note (Signed)
[facility] Patient Name: Beth Peters Procedure Date: 09/20/2017 MRN: [id number] Attending MD: Clarene Essex , MD Date of Birth: December 16, 1950 CSN: [id number] Age: 67 Admit Type: Emergency Department Procedure:                Upper GI endoscopy Indications:              Foreign body in the esophagus in patient with                            history of achalasia Providers:                Clarene Essex, MD, Carolynn Comment RN, RN, Tinnie Gens, Technician Referring MD:              Medicines:                Fentanyl 75 micrograms IV, Midazolam 7 mg IV,                            Cetacaine spray Complications:            No immediate complications. Estimated Blood Loss:     Estimated blood loss: none. Procedure:                Pre-Anesthesia Assessment:                           - Prior to the procedure, a History and Physical                            was performed, and patient medications and                            allergies were reviewed. The patient's tolerance of                            previous anesthesia was also reviewed. The risks                            and benefits of the procedure and the sedation                            options and risks were discussed with the patient.                            All questions were answered, and informed consent                            was obtained. Prior Anticoagulants: The patient has                            taken aspirin, last dose was 1 day prior to  procedure. ASA Grade Assessment: I - A normal,                            healthy patient. After reviewing the risks and                            benefits, the patient was deemed in satisfactory                            condition to undergo the procedure.                           After obtaining informed consent, the endoscope was                            passed under direct vision. Throughout the                            procedure, the patient's blood pressure, pulse, and                            oxygen saturations were monitored continuously. The                            Endoscope was introduced through the mouth, and                            advanced to the duodenal bulb. The upper GI                            endoscopy was accomplished without difficulty. The                            patient tolerated the procedure well. Scope In: Scope Out: Findings:      Food was found in the lower third of the esophagus. Removal of food was       accomplished by gently pushing into the stomach which was easily done       because the pieces were rather small and we could see the GE junction       rule out and was seemingly patent and we were easily able to advance the       scope the first time past the food without resistance and there was no       residual food in the esophagus at the end of the procedure.      A small hiatal hernia was present.      The duodenal bulb was normal except food.      The exam was otherwise without abnormality. Impression:               - Food in the lower third of the esophagus. Removal                            was successful. By very gently pushing into the  stomach- Small hiatal hernia.                           - Normal duodenal bulb. Except food                           - The examination was otherwise normal. Moderate Sedation:      Moderate (conscious) sedation was administered by the endoscopy nurse       and supervised by the endoscopist. The following parameters were       monitored: oxygen saturation, heart rate, blood pressure, respiratory       rate, EKG, adequacy of pulmonary ventilation, and response to care. Recommendation:           - Patient has a contact number available for                            emergencies. The signs and symptoms of potential                            delayed complications were  discussed with the                            patient. Return to normal activities tomorrow.                            Written discharge instructions were provided to the                            patient.                           - Clear liquid diet for 2 hours. If doing well may                            have soft solids tomorrow and then eat slowly and                            chew your food well and drink plenty of fluids                           - Continue present medications.                           - Return to GI clinic at appointment to be                            scheduled. If doing well follow-up with your                            primary gastroenterologist Dr. B in a few weeks                           - Telephone GI clinic if symptomatic PRN. Procedure Code(s):        --- Professional ---  [phone], Esophagogastroduodenoscopy, flexible,                            transoral; with removal of foreign body(s) Diagnosis Code(s):        --- Professional ---                           A19.379K, Food in esophagus causing other injury,                            initial encounter                           K44.9, Diaphragmatic hernia without obstruction or                            gangrene                           T18.108A, Unspecified foreign body in esophagus                            causing other injury, initial encounter CPT copyright 2016 American Medical Association. All rights reserved. The codes documented in this report are preliminary and upon coder review may  be revised to meet current compliance requirements. Clarene Essex, MD 09/21/2017 12:11:09 AM This report has been signed electronically. Number of Addenda: 0

## 2017-09-21 NOTE — Discharge Instructions (Signed)
Do not drive for you sharp objects until afternoon tomorrow and begin with clear liquids and may have soft solids tomorrow and remember to eat slowly and chew your food well and drink plenty of liquids and follow-up with your primary gastroenterologist Dr. B in a few weeks but call sooner if needed

## 2017-09-22 ENCOUNTER — Encounter (HOSPITAL_COMMUNITY): Payer: Self-pay | Admitting: Gastroenterology

## 2017-09-24 DIAGNOSIS — M546 Pain in thoracic spine: Secondary | ICD-10-CM | POA: Diagnosis not present

## 2017-09-24 DIAGNOSIS — M542 Cervicalgia: Secondary | ICD-10-CM | POA: Diagnosis not present

## 2017-09-24 DIAGNOSIS — M5136 Other intervertebral disc degeneration, lumbar region: Secondary | ICD-10-CM | POA: Diagnosis not present

## 2017-10-01 DIAGNOSIS — M546 Pain in thoracic spine: Secondary | ICD-10-CM | POA: Diagnosis not present

## 2017-10-13 DIAGNOSIS — M5136 Other intervertebral disc degeneration, lumbar region: Secondary | ICD-10-CM | POA: Diagnosis not present

## 2017-10-14 DIAGNOSIS — M546 Pain in thoracic spine: Secondary | ICD-10-CM | POA: Diagnosis not present

## 2017-10-14 DIAGNOSIS — M47814 Spondylosis without myelopathy or radiculopathy, thoracic region: Secondary | ICD-10-CM | POA: Insufficient documentation

## 2017-10-14 DIAGNOSIS — M542 Cervicalgia: Secondary | ICD-10-CM | POA: Diagnosis not present

## 2017-10-14 DIAGNOSIS — M5136 Other intervertebral disc degeneration, lumbar region: Secondary | ICD-10-CM | POA: Diagnosis not present

## 2017-10-16 ENCOUNTER — Ambulatory Visit: Payer: Self-pay | Admitting: Orthopedic Surgery

## 2017-10-28 ENCOUNTER — Encounter: Payer: Self-pay | Admitting: Family Medicine

## 2017-10-28 ENCOUNTER — Ambulatory Visit (INDEPENDENT_AMBULATORY_CARE_PROVIDER_SITE_OTHER): Payer: PPO | Admitting: Family Medicine

## 2017-10-28 VITALS — BP 126/84 | HR 74 | Temp 97.8°F | Ht 63.0 in | Wt 156.4 lb

## 2017-10-28 DIAGNOSIS — Z01818 Encounter for other preprocedural examination: Secondary | ICD-10-CM

## 2017-10-28 DIAGNOSIS — K220 Achalasia of cardia: Secondary | ICD-10-CM | POA: Insufficient documentation

## 2017-10-28 NOTE — Progress Notes (Signed)
Subjective:    Beth Peters is a 67 y.o. female who presents to the office today for a preoperative consultation. No concerns.  Patient Active Problem List   Diagnosis Date Noted  . DDD (degenerative disc disease) 10/16/2012  . Scoliosis 10/16/2012  . Rectocele without mention of uterine prolapse 10/14/2012  . Dysphagia, unspecified(787.20) 10/14/2012  . Rheumatoid arthritis ([place]) 10/07/2012  . Arthralgia 10/07/2012  . OA (osteoarthritis) 10/07/2012  . Eczema 08/08/2011  . Hyperlipidemia 07/24/2011  . Esophageal reflux 05/24/2009  . Allergic rhinitis 05/24/2009  . Lesion of ulnar nerve 05/24/2009  . Essential hypertension 02/27/2009  . Asthma 02/27/2009   Past Surgical History:  Procedure Laterality Date  . ESOPHAGEAL DILATION     Achalasia  . ESOPHAGOGASTRODUODENOSCOPY N/A 09/20/2017   Procedure: ESOPHAGOGASTRODUODENOSCOPY (EGD);  Surgeon: Clarene Essex, MD;  Location: [facility];  Service: Endoscopy;  Laterality: N/A;  . HYSTEROSCOPY  2009  . HYSTEROSCOPY  2011   Benign polyps  . HYSTEROSCOPY  2012   Benign polyps  . KNEE SURGERY    . MYOTOMY  2/06   Hiliar myotomy and repair-hiatal hernia  . OTHER SURGICAL HISTORY  9/13   Esophagael Dilitation  . ROTATOR CUFF REPAIR Right 09/2011  . TONSILLECTOMY AND ADENOIDECTOMY  childhood  . TOTAL KNEE ARTHROPLASTY Left 7/07  . TOTAL KNEE ARTHROPLASTY Right 1/09  . VARICOSE VEIN SURGERY  3/07    Social History   Socioeconomic History  . Marital status: Married    Spouse name: Not on file  . Number of children: Not on file  . Years of education: Not on file  . Highest education level: Not on file  Occupational History  . Not on file  Social Needs  . Financial resource strain: Not on file  . Food insecurity:    Worry: Not on file    Inability: Not on file  . Transportation needs:    Medical: Not on file    Non-medical: Not on file  Tobacco Use  . Smoking status: Never Smoker  . Smokeless tobacco: Never Used    Substance and Sexual Activity  . Alcohol use: Yes    Alcohol/week: 0.0 oz    Types: 2 - 3 Glasses of wine per week    Comment: White wine  . Drug use: No  . Sexual activity: Yes    Partners: Male    Birth control/protection: Surgical    Comment: vasectomy  Lifestyle  . Physical activity:    Days per week: Not on file    Minutes per session: Not on file  . Stress: Not on file  Relationships  . Social connections:    Talks on phone: Not on file    Gets together: Not on file    Attends religious service: Not on file    Active member of club or organization: Not on file    Attends meetings of clubs or organizations: Not on file    Relationship status: Not on file  . Intimate partner violence:    Fear of current or ex partner: Not on file    Emotionally abused: Not on file    Physically abused: Not on file    Forced sexual activity: Not on file  Other Topics Concern  . Not on file  Social History Narrative  . Not on file    The following portions of the patient's history were reviewed and updated as appropriate: allergies, current medications, past family history, past medical history, past social history,  past surgical history and problem list.  Review of Systems Pertinent items noted in HPI and remainder of comprehensive ROS otherwise negative.    Objective:   Physical Exam BP 126/84   Pulse 74   Temp 97.8 F (36.6 C) (Oral)   Ht 5\' 3"  (1.6 m)   Wt 156 lb 6.4 oz (70.9 kg)   LMP 09/23/2012   SpO2 95%   BMI 27.71 kg/m   General Appearance:    Alert, cooperative, no distress, appears stated age  Head:    Normocephalic, without obvious abnormality, atraumatic  Eyes:    PERRL, conjunctiva/corneas clear, EOM's intact, fundi    benign, both eyes  Ears:    Normal TM's and external ear canals, both ears  Nose:   Nares normal, septum midline, mucosa normal, no drainage    or sinus tenderness  Throat:   Lips, mucosa, and tongue normal; teeth and gums normal  Neck:    Supple, symmetrical, trachea midline, no adenopathy;    thyroid:  no enlargement/tenderness/nodules; no carotid   bruit or JVD  Back:     Symmetric, no curvature, ROM normal, no CVA tenderness  Lungs:     Clear to auscultation bilaterally, respirations unlabored  Chest Wall:    No tenderness or deformity   Heart:    Regular rate and rhythm, S1 and S2 normal, no murmur, rub   or gallop  Abdomen:     Soft, non-tender, bowel sounds active all four quadrants,    no masses, no organomegaly  Extremities:   Extremities normal, atraumatic, no cyanosis or edema  Pulses:   2+ and symmetric all extremities  Skin:   Skin color, texture, turgor normal, no rashes or lesions  Lymph nodes:   Cervical, supraclavicular, and axillary nodes normal  Neurologic:   CNII-XII intact, normal strength, sensation and reflexes    throughout    Cardiographics ECG: normal sinus rhythm, no blocks or conduction defects, no ischemic changes Echocardiogram: not done  Lab Review  No visits with results within 2 Month(s) from this visit.  Latest known visit with results is:  Office Visit on 06/02/2017  Component Date Value  . TSH 06/02/2017 1.74   . Free T4 06/02/2017 0.97   . Thyroperoxidase Ab SerPl* 06/02/2017 <1   . Hemoglobin A1C 06/02/2017 5.4      Assessment:   67 y.o. female with planned surgery as above.    Cardiac Risk Estimation: per the Revised Cardiac Risk Index, the patient's risk factors for cardiac complications = 0.  Cerebrovascular Disease 1  Congestive Heart Failure 1  Creatinine > 2 1  Diabetes Requiring Insulin 1  Ischemic Cardiac Disease   Suprainguinal Vascular Surgery, Intrathoracic Surgery, Intra-abdominal Surgery (High Risk Surgery) 1    Plan:   1. Optimized for surgery.   Orders Placed This Encounter  Procedures  . CBC with Differential/Platelet  . Comprehensive metabolic panel  . Urinalysis, Routine w reflex microscopic  . EKG 12-Lead   Briscoe Deutscher

## 2017-10-29 ENCOUNTER — Ambulatory Visit (INDEPENDENT_AMBULATORY_CARE_PROVIDER_SITE_OTHER): Payer: PPO | Admitting: Family Medicine

## 2017-10-29 VITALS — BP 132/84 | HR 83 | Temp 98.1°F | Ht 63.0 in | Wt 156.0 lb

## 2017-10-29 DIAGNOSIS — Z803 Family history of malignant neoplasm of breast: Secondary | ICD-10-CM | POA: Diagnosis not present

## 2017-10-29 DIAGNOSIS — Z01818 Encounter for other preprocedural examination: Secondary | ICD-10-CM | POA: Diagnosis not present

## 2017-10-29 DIAGNOSIS — M816 Localized osteoporosis [Lequesne]: Secondary | ICD-10-CM

## 2017-10-29 DIAGNOSIS — R600 Localized edema: Secondary | ICD-10-CM

## 2017-10-29 DIAGNOSIS — N951 Menopausal and female climacteric states: Secondary | ICD-10-CM

## 2017-10-29 LAB — CBC WITH DIFFERENTIAL/PLATELET
Basophils Absolute: 0 10*3/uL (ref 0.0–0.1)
Basophils Relative: 0.6 % (ref 0.0–3.0)
Eosinophils Absolute: 0.2 10*3/uL (ref 0.0–0.7)
Eosinophils Relative: 3 % (ref 0.0–5.0)
HCT: 42.6 % (ref 36.0–46.0)
Hemoglobin: 14.5 g/dL (ref 12.0–15.0)
Lymphocytes Relative: 30.6 % (ref 12.0–46.0)
Lymphs Abs: 2.2 10*3/uL (ref 0.7–4.0)
MCHC: 34 g/dL (ref 30.0–36.0)
MCV: 93 fl (ref 78.0–100.0)
Monocytes Absolute: 0.4 10*3/uL (ref 0.1–1.0)
Monocytes Relative: 5.1 % (ref 3.0–12.0)
Neutro Abs: 4.3 10*3/uL (ref 1.4–7.7)
Neutrophils Relative %: 60.7 % (ref 43.0–77.0)
Platelets: 184 10*3/uL (ref 150.0–400.0)
RBC: 4.59 Mil/uL (ref 3.87–5.11)
RDW: 13.4 % (ref 11.5–15.5)
WBC: 7.1 10*3/uL (ref 4.0–10.5)

## 2017-10-29 LAB — URINALYSIS, ROUTINE W REFLEX MICROSCOPIC
Bilirubin Urine: NEGATIVE
Hgb urine dipstick: NEGATIVE
Ketones, ur: NEGATIVE
Leukocytes, UA: NEGATIVE
Nitrite: NEGATIVE
RBC / HPF: NONE SEEN (ref 0–?)
Specific Gravity, Urine: <=1.005 — AB (ref 1.000–1.030)
Total Protein, Urine: NEGATIVE
Urine Glucose: NEGATIVE
Urobilinogen, UA: 0.2 (ref 0.0–1.0)
WBC, UA: NONE SEEN (ref 0–?)
pH: 6 (ref 5.0–8.0)

## 2017-10-29 LAB — COMPREHENSIVE METABOLIC PANEL
ALT: 20 U/L (ref 0–35)
AST: 15 U/L (ref 0–37)
Albumin: 4.2 g/dL (ref 3.5–5.2)
Alkaline Phosphatase: 60 U/L (ref 39–117)
BUN: 12 mg/dL (ref 6–23)
CO2: 30 mEq/L (ref 19–32)
Calcium: 9.7 mg/dL (ref 8.4–10.5)
Chloride: 102 mEq/L (ref 96–112)
Creatinine, Ser: 0.87 mg/dL (ref 0.40–1.20)
GFR: 69.34 mL/min (ref >60.00)
Glucose, Bld: 101 mg/dL — ABNORMAL HIGH (ref 70–99)
Potassium: 4.2 mEq/L (ref 3.5–5.1)
Sodium: 139 mEq/L (ref 135–145)
Total Bilirubin: 0.5 mg/dL (ref 0.2–1.2)
Total Protein: 6.5 g/dL (ref 6.0–8.3)

## 2017-10-29 MED ORDER — HYDROCHLOROTHIAZIDE 12.5 MG PO CAPS: 12.5000 mg | ORAL_CAPSULE | Freq: Every day | ORAL | 3 refills | Status: DC

## 2017-10-29 NOTE — Progress Notes (Signed)
Beth Peters is a 67 y.o. female is here for follow up.  History of Present Illness:   Beth Peters CMA acting as scribe for Dr. [name].  HPI: Patient comes in today with several concerns.  Osteoarthritis: Patient would like to discuss starting Evista for her osteoarthritis. She does not want to do the Prolia injection due to the side affects.   Genetic testing: Patient would like to discuss genetic testing. She has had several family members that has cancer so would like to have the testing done.   Hypertension: Patient would like to go back on the HCTZ.   Health Maintenance Due  Topic Date Due  . Hepatitis C Screening  06-04-1951  . HIV Screening  04/28/1967  . PAP SMEAR  10/18/2012  . COLONOSCOPY  08/15/2013  . TETANUS/TDAP  07/16/2015  . PNA vac Low Risk Adult (1 of 2 - PCV13) 04/27/2017   Depression screen PHQ 2/9 06/02/2017  Decreased Interest 0  Down, Depressed, Hopeless 0  PHQ - 2 Score 0   PMHx, SurgHx, SocialHx, FamHx, Medications, and Allergies were reviewed in the Visit Navigator and updated as appropriate.   Patient Active Problem List   Diagnosis Date Noted  . Achalasia of esophagus 10/28/2017  . Thoracic spondylosis 10/14/2017  . Chronic thoracic back pain 08/12/2017  . Chronic neck pain 08/12/2017  . 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  . DDD (degenerative disc disease) 10/16/2012  . Scoliosis 10/16/2012  . Rectocele without mention of uterine prolapse 10/14/2012  . Dysphagia, unspecified(787.20) 10/14/2012  . Rheumatoid arthritis ([place]) 10/07/2012  . Arthralgia 10/07/2012  . OA (osteoarthritis) 10/07/2012  . Eczema 08/08/2011  . Hyperlipidemia 07/24/2011  . Esophageal reflux 05/24/2009  . Allergic rhinitis 05/24/2009  . Lesion of ulnar nerve 05/24/2009  . Essential hypertension 02/27/2009  . Asthma 02/27/2009   Social History   Tobacco Use  . Smoking status: Never Smoker  . Smokeless tobacco: Never Used  Substance  Use Topics  . Alcohol use: Yes    Alcohol/week: 0.0 oz    Types: 2 - 3 Glasses of wine per week    Comment: White wine  . Drug use: No   Current Medications and Allergies:   Current Outpatient Medications:  .  buPROPion (WELLBUTRIN XL) 150 MG 24 hr tablet, Take 1 tablet (150 mg total) by mouth daily., Disp: 90 tablet, Rfl: 3 .  Calcium-Vitamin D-Vitamin K (VIACTIV) 627-035-00 MG-UNT-MCG CHEW, Chew 1 each by mouth daily., Disp: , Rfl:  .  esomeprazole (NEXIUM) 40 MG capsule, Take 40 mg by mouth daily before breakfast., Disp: , Rfl:  .  fluticasone (FLONASE) 50 MCG/ACT nasal spray, Place 1 spray into both nostrils daily as needed for allergies or rhinitis., Disp: , Rfl:  .  Fluticasone-Salmeterol (ADVAIR DISKUS) 250-50 MCG/DOSE AEPB, Inhale 1 puff into the lungs 2 (two) times daily., Disp: , Rfl:  .  hydrochlorothiazide (HYDRODIURIL) 12.5 MG tablet, Take 12.5 mg by mouth daily. , Disp: , Rfl:  .  Multiple Vitamin (MULTIVITAMIN) tablet, Take 1 tablet by mouth daily., Disp: , Rfl:  .  hydrochlorothiazide (MICROZIDE) 12.5 MG capsule, Take 1 capsule (12.5 mg total) by mouth daily., Disp: 30 capsule, Rfl: 3   Allergies  Allergen Reactions  . Dilaudid [Hydromorphone Hcl] Hives, Shortness Of Breath and Itching  . Glucagon Anaphylaxis  . Wasp Venom Itching and Swelling   Review of Systems   Pertinent items are noted in the HPI. Otherwise, ROS is negative.  Vitals:   Vitals:  10/29/17 1356  BP: 132/84  Pulse: 83  Temp: 98.1 F (36.7 C)  TempSrc: Oral  SpO2: 94%  Weight: 156 lb (70.8 kg)  Height: 5\' 3"  (1.6 m)     Body mass index is 27.63 kg/m.   Physical Exam:   Physical Exam  Constitutional: She appears well-nourished.  HENT:  Head: Normocephalic and atraumatic.  Eyes: Pupils are equal, round, and reactive to light. EOM are normal.  Neck: Normal range of motion. Neck supple.  Cardiovascular: Normal rate, regular rhythm, normal heart sounds and intact distal pulses.    Pulmonary/Chest: Effort normal.  Abdominal: Soft.  Skin: Skin is warm.  Psychiatric: She has a normal mood and affect. Her behavior is normal.  Nursing note and vitals reviewed.   Results for orders placed or performed in visit on 10/29/17  Comprehensive metabolic panel  Result Value Ref Range   Sodium 139 135 - 145 mEq/L   Potassium 4.2 3.5 - 5.1 mEq/L   Chloride 102 96 - 112 mEq/L   CO2 30 19 - 32 mEq/L   Glucose, Bld 101 (H) 70 - 99 mg/dL   BUN 12 6 - 23 mg/dL   Creatinine, Ser 0.87 0.40 - 1.20 mg/dL   Total Bilirubin 0.5 0.2 - 1.2 mg/dL   Alkaline Phosphatase 60 39 - 117 U/L   AST 15 0 - 37 U/L   ALT 20 0 - 35 U/L   Total Protein 6.5 6.0 - 8.3 g/dL   Albumin 4.2 3.5 - 5.2 g/dL   Calcium 9.7 8.4 - 10.5 mg/dL   GFR 69.34 >60.00 mL/min  CBC with Differential/Platelet  Result Value Ref Range   WBC 7.1 4.0 - 10.5 K/uL   RBC 4.59 3.87 - 5.11 Mil/uL   Hemoglobin 14.5 12.0 - 15.0 g/dL   HCT 42.6 36.0 - 46.0 %   MCV 93.0 78.0 - 100.0 fl   MCHC 34.0 30.0 - 36.0 g/dL   RDW 13.4 11.5 - 15.5 %   Platelets 184.0 150.0 - 400.0 K/uL   Neutrophils Relative % 60.7 43.0 - 77.0 %   Lymphocytes Relative 30.6 12.0 - 46.0 %   Monocytes Relative 5.1 3.0 - 12.0 %   Eosinophils Relative 3.0 0.0 - 5.0 %   Basophils Relative 0.6 0.0 - 3.0 %   Neutro Abs 4.3 1.4 - 7.7 K/uL   Lymphs Abs 2.2 0.7 - 4.0 K/uL   Monocytes Absolute 0.4 0.1 - 1.0 K/uL   Eosinophils Absolute 0.2 0.0 - 0.7 K/uL   Basophils Absolute 0.0 0.0 - 0.1 K/uL  Urinalysis, Routine w reflex microscopic  Result Value Ref Range   Color, Urine YELLOW Yellow;Lt. Yellow   APPearance CLEAR Clear   Specific Gravity, Urine <=1.005 (A) 1.000 - 1.030   pH 6.0 5.0 - 8.0   Total Protein, Urine NEGATIVE Negative   Urine Glucose NEGATIVE Negative   Ketones, ur NEGATIVE Negative   Bilirubin Urine NEGATIVE Negative   Hgb urine dipstick NEGATIVE Negative   Urobilinogen, UA 0.2 0.0 - 1.0   Leukocytes, UA NEGATIVE Negative   Nitrite  NEGATIVE Negative   WBC, UA none seen 0-2/hpf   RBC / HPF none seen 0-2/hpf    Assessment and Plan:   Beth Peters was seen today for follow-up.  Diagnoses and all orders for this visit:  Localized osteoporosis without current pathological fracture  Family history of breast cancer -     Ambulatory referral to Gynecology  Perimenopausal vasomotor symptoms -     Ambulatory referral  to Gynecology  Preop general physical exam Comments: Exam completed yesterday.  Labs today. Orders: -     Comprehensive metabolic panel -     CBC with Differential/Platelet -     Urinalysis, Routine w reflex microscopic  Lower extremity edema -     hydrochlorothiazide (MICROZIDE) 12.5 MG capsule; Take 1 capsule (12.5 mg total) by mouth daily.   . Reviewed expectations re: course of current medical issues. . Discussed self-management of symptoms. . Outlined signs and symptoms indicating need for more acute intervention. . Patient verbalized understanding and all questions were answered. Marland Kitchen Health Maintenance issues including appropriate healthy diet, exercise, and smoking avoidance were discussed with patient. . See orders for this visit as documented in the electronic medical record. . Patient received an After Visit Summary.  Briscoe Deutscher, DO [place] [place] 11/03/2017  Future Appointments  Date Time Provider [place]  11/20/2017  9:30 AM Salvadore Dom, MD [place] None

## 2017-10-30 ENCOUNTER — Telehealth: Payer: Self-pay | Admitting: Obstetrics and Gynecology

## 2017-10-30 NOTE — Telephone Encounter (Signed)
Called and left a message for patient to call back to schedule a new patient doctor referral appointment with our office to see Dr. Talbert Nan for evaluation re: hormone replacement.

## 2017-10-31 ENCOUNTER — Ambulatory Visit: Payer: Self-pay | Admitting: Orthopedic Surgery

## 2017-10-31 NOTE — H&P (Signed)
Neeb, Kloi [phone], F) DOB Mar 12, 1951   Chief Complaint Right knee pain and instability H&P right knee poly vs TKA revision  Patient's Care Team Primary Care Provider: Rhunette Croft DO: [place] [address], [place], [place] [address], Ph [phone], Fax [phone] NPI: [id number]  Patient's Pharmacies [id number] ([place]): [address] [place], [place] [address], Ph [phone], Fax [phone]   Vitals Ht: 5 ft 3 in  Wt: 155 lbs  BMI: 27.5  BP: 132/78  Pulse: 62 bpm   Allergies Reviewed Allergies DILAUDID: Hives, Itching, Respiratory distress  GLUCAGON: Anaphylaxis  VENOM-WASP: Hives, Itching    Medications Reviewed Medications Advair Diskus 08/12/17   entered Renae Gloss buPROPion HCl XL 150 mg 24 hr tablet, extended release 08/14/17   filled RXOPTIONS calcium 08/12/17   entered Shanda Robertson esomeprazole magnesium 40 mg capsule,delayed release 07/09/17   filled RXOPTIONS hydroCHLOROthiazide 12.5 mg capsule Take 1 capsule(s) every day by oral route. 10/31/17   entered ALEXZANDREW PERKINS, PA-C Ultram 50 mg tablet Take 1 tablet(s) 3 times a day by oral route as needed. 09/24/17   prescribed Suella Broad, MD Vitamin D2 08/12/17   entered Renae Gloss Wellbutrin 08/12/17   entered Renae Gloss             Problems Reviewed Problems History of right total knee replacement - Onset: 10/30/2017 Pain in right knee - Onset: 10/30/2017 Chronic neck pain - Onset: 08/12/2017 Chronic thoracic back pain - Onset: 08/12/2017 Degeneration of lumbar intervertebral disc - Onset: 07/24/2017 Thoracic spondylosis - Onset: 10/14/2017   Family History Reviewed Family History Father - Father deceased   - Heart disease   - Cerebrovascular accident   - History of carcinoma Mother - Heart disease   - Cerebrovascular accident   - Chronic depression Sister - Obesity   - History of carcinoma   Social History Reviewed Social  History Smoking Status: Never smoker Alcohol intake: Moderate Hand Dominance: Right Work related injury?: N Advance directive: [place] of Attorney: N   Surgical History Reviewed Surgical History Endoscopy - 2012, 2015, 2016, 2019 Stripping of vein - Left Leg - 6468 Fundoplication - 0321 Dilation of esophagus - Several Procedures Tonsillectomy - 1961 Total replacement of left knee joint - 2007 Vascular Surgery Knee Arthroscopy - Left Knee - 2006 Dilation and curettage - 2004 Total replacement of right knee joint - 2009   Past Medical History Reviewed Past Medical History Asthma: Y Notes: History of Bronchitis,  Esophageal Achalasia,  Superficial Phlebitis,  Varicose Veins,  Depression,  Osteopenia,  Degeneratice Disc Disease,  Menopause   HPI The patient is here today for a pre-operative History and Physical. They are scheduled for right knee poly revision vs total knee revision on 11-26-2017 with Dr. Wynelle Link at [facility]. The patient is a 67 year old female who is over 9 years out from right total knee arthroplasty (11 years out from left total knee). The patient states that she is not doing well at this time. The pain is under fair control at this time and describe their pain as mild to moderate. She states that she feels some instability in both knees, and has difficulty, especially with right knee, going down steps. [name] just recently moved back to [place] from [place]. She has been having some trouble with the RIGHT knee. She mainly has pain when she is going downstairs. The knee gives out on her at times. She does not have swelling. There is some soreness  in the LEFT knee also with the RIGHT one is most problematic. She saw an orthopedist in [place] who said the knee was unstable and that it was loose. He recommended revision. Radiographs-AP and lateral of both knees show both prostheses in excellent position with no periprosthetic abnormalities.  There is no evidence of any wear or lysis. Her RIGHT knee is very problematic. She does have some laxity that was not present previously. She may have had a little laxity before but has gotten worse. It has no signs of any periprosthetic loosening. There is a chance this may can be fixed with a thicker polyethylene. We discussed revision surgery and she definitely would like to do that. I told her more than likely she would be able to just have a polyethylene revision but there is a very small chance we have to revise the entire knee. We discussed all that in detail and she would like to go ahead and proceed.   ROS CONSTITUTIONAL: NIGHT SWEATS, but no Fever, no Chills, no Weight Loss  CARDIOVASCULAR: ASTHMA, but no Cough, no Shortness of Breath, no COPD  GASTROINTESTINAL: no Vomiting, no Nausea  MUSCULOSKELETAL: JOINT PAIN, SWELLING IN JOINTS  NEUROLOGIC: NUMBNESS, TINGLING/PARESTHESIAS, DIFFICULTY WITH BALANCE  Physical Exam Patient is a 67 year old female.  General Mental Status - Alert, cooperative and good historian. General Appearance - pleasant, Not in acute distress. Orientation - Oriented X3. Build & Nutrition - Well nourished and Well developed.  Head and Neck Head - normocephalic, atraumatic . Neck Global Assessment - supple, no bruit auscultated on the right, no bruit auscultated on the left.  Eye Pupil - Bilateral - PERR Motion - Bilateral - EOMI.  Chest and Lung Exam Auscultation Breath sounds - clear at anterior chest wall and clear at posterior chest wall. Adventitious sounds - No Adventitious sounds.  Cardiovascular Auscultation Rhythm - Regular rate and rhythm. Heart Sounds - S1 WNL and S2 WNL. Murmurs & Other Heart Sounds - Auscultation of the heart reveals - No Murmurs.  Abdomen Palpation/Percussion Tenderness - Abdomen is non-tender to palpation. Abdomen is soft. Auscultation Auscultation of the abdomen reveals - Bowel sounds  normal.  Genitourinary Note: Not done, not pertinent to present illness  Musculoskeletal Evaluation of the left hip shows flexion to 120 rotation in 30 out 40 and abduction 40 without discomfort. There is no tenderness over the greater trochanter. There is no pain on provocative testing of the hip.Examination of the right hip shows flexion to 120 rotation in 30 abduction 40 and external rotation of 40. There is no tenderness over the greater trochanter. There is no pain on provocative testing of the hip.Marland Kitchen Her RIGHT knee shows no effusion. Range of motion is 0-125. She does has some slight varus valgus laxity in extension as well as AP laxity in flexion. There is no gross instability. LEFT knee shows no effusion. Range 0-125 and no tenderness or instability.  Radiographs-AP and lateral of both knees show both prostheses in excellent position with no periprosthetic abnormalities. There is no evidence of any wear or lysis.   Assessment / Plan 1. History of right total knee replacement Z96.651: Presence of right artificial knee joint  2. Pain in right knee M25.561: Pain in right knee  3. Instability of joint M25.30: Other instability, unspecified joint  Goals Patient Instructions Surgical Plans: Polyethylene Revision Right Total Knee versus Full Revision Right Total Knee Disposition: Home, Straight to outpatient therpay - [facility] PCP: Briscoe Deutscher, [place] IV TXA Anesthesia  Issues: Nausea in the past Patient was instructed on what medications to stop prior to surgery. - Follow up visit in 2 weeks with Dr. Wynelle Link - Begin physical therapy following surgery - Provider to call patient to setup first PT Eval on Monday 12/01/17 - Dr. Wynelle Link - Revision Right Total Knee - Pre-operative lab work as pre Pre-Surgical Testing - Prescriptions will be provided in hospital at time of discharge  Anticipated LOS equal to or greater than 2 midnights due to - Age 19 and older  with one or more of the following:  - Obesity  - Expected need for hospital services (PT, OT, Nursing) required for safe  discharge  - Anticipated need for postoperative skilled nursing care or inpatient rehab  - Active co-morbidities: None    Return to Office PATRICIA POFF, DPT for Physical Therapy Evaluation at [facility] on 12/01/2017 at 11:30 AM Gaynelle Arabian, MD for 5-Post-Op at [facility] on 12/09/2017 at 01:30 PM  Encounter signed-off by Mickel Crow, PA-C

## 2017-10-31 NOTE — H&P (Signed)
Beth Peters, Beth Peters [id number], F) DOB    1950-10-22        Chief Complaint Right knee pain and instability H&P right knee poly vs TKA revision  Patient's Care Team Primary Care Provider: Rhunette Croft DO: [place] [address], [place], [place] [address], Ph [phone], Fax [phone] NPI: [id number]  Patient's Pharmacies [id number] ([place]): [address] [place], [place] [address], Ph [phone], Fax [phone]   Vitals Ht:        5 ft 3 in  Wt:       155 lbs  BMI:     27.5  BP:      132/78  Pulse:  62 bpm   Allergies Reviewed Allergies DILAUDID: Hives, Itching, Respiratory distress    GLUCAGON: Anaphylaxis    VENOM-WASP: Hives, Itching            Medications Reviewed Medications Advair Diskus 08/12/17   entered       Beth Peters buPROPion HCl XL 150 mg 24 hr tablet, extended release 08/14/17   filled           RXOPTIONS calcium 08/12/17   entered       Beth Peters esomeprazole magnesium 40 mg capsule,delayed release 07/09/17   filled           RXOPTIONS hydroCHLOROthiazide 12.5 mg capsule Take 1 capsule(s) every day by oral route. 10/31/17   entered       Beth Chirico, PA-C Ultram 50 mg tablet Take 1 tablet(s) 3 times a day by oral route as needed. 09/24/17   prescribed  Beth Broad, MD Vitamin D2 08/12/17   entered       Beth Peters Wellbutrin 08/12/17   entered       Beth Peters                                                                                                                           Problems Reviewed Problems History of right total knee replacement - Onset: 10/30/2017 Pain in right knee - Onset: 10/30/2017 Chronic neck pain - Onset: 08/12/2017 Chronic thoracic back pain - Onset: 08/12/2017 Degeneration of lumbar intervertebral disc - Onset: 07/24/2017 Thoracic spondylosis - Onset: 10/14/2017   Family History Reviewed Family History Father  - Father deceased             - Heart  disease             - Cerebrovascular accident             - History of carcinoma Mother - Heart disease             - Cerebrovascular accident             - Chronic depression Sister   - Obesity             - History of carcinoma   Social History Reviewed Social History Smoking Status: Never smoker Alcohol intake: Moderate  Hand Dominance: Right Work related injury?: N Advance directive: N Medical Power of Attorney: N   Surgical History Reviewed Surgical History Endoscopy - 2012, 2015, 2016, 2019 Stripping of vein - Left Leg - 4098 Fundoplication - 1191 Dilation of esophagus - Several Procedures Tonsillectomy - 1961 Total replacement of left knee joint - 2007 Vascular Surgery Knee Arthroscopy - Left Knee - 2006 Dilation and curettage - 2004 Total replacement of right knee joint - 2009   Past Medical History Reviewed Past Medical History Asthma: Y Notes: History of Bronchitis,  Esophageal Achalasia,  Superficial Phlebitis,  Varicose Veins,  Depression,  Osteopenia,  Degeneratice Disc Disease,  Menopause   HPI The patient is here today for a pre-operative History and Physical. They are scheduled for right knee poly revision vs total knee revision on 11-26-2017 with Dr. Wynelle Link at [facility]. The patient is a 67 year old female who is over 9 years out from right total knee arthroplasty (11 years out from left total knee). The patient states that she is not doing well at this time. The pain is under fair control at this time and describe their pain as mild to moderate. She states that she feels some instability in both knees, and has difficulty, especially with right knee, going down steps. Beth Peters just recently moved back to [place] from [place]. She has been having some trouble with the RIGHT knee. She mainly has pain when she is going downstairs. The knee gives out on her at times. She does not have swelling. There is some soreness in the LEFT knee  also with the RIGHT one is most problematic. She saw an orthopedist in [place] who said the knee was unstable and that it was loose. He recommended revision. Radiographs-AP and lateral of both knees show both prostheses in excellent position with no periprosthetic abnormalities. There is no evidence of any wear or lysis. Her RIGHT knee is very problematic. She does have some laxity that was not present previously. She may have had a little laxity before but has gotten worse. It has no signs of any periprosthetic loosening. There is a chance this may can be fixed with a thicker polyethylene. We discussed revision surgery and she definitely would like to do that. I told her more than likely she would be able to just have a polyethylene revision but there is a very small chance we have to revise the entire knee. We discussed all that in detail and she would like to go ahead and proceed.   ROS CONSTITUTIONAL: NIGHT SWEATS, but no Fever, no Chills, no Weight Loss  CARDIOVASCULAR: ASTHMA, but no Cough, no Shortness of Breath, no COPD  GASTROINTESTINAL: no Vomiting, no Nausea  MUSCULOSKELETAL: JOINT PAIN, SWELLING IN JOINTS  NEUROLOGIC: NUMBNESS, TINGLING/PARESTHESIAS, DIFFICULTY WITH BALANCE  Physical Exam Patient is a 67 year old female.  General Mental Status - Alert, cooperative and good historian. General Appearance - pleasant, Not in acute distress. Orientation - Oriented X3. Build & Nutrition - Well nourished and Well developed.  Head and Neck Head - normocephalic, atraumatic . Neck Global Assessment - supple, no bruit auscultated on the right, no bruit auscultated on the left.  Eye Pupil - Bilateral - PERR Motion - Bilateral - EOMI.  Chest and Lung Exam Auscultation Breath sounds - clear at anterior chest wall and clear at posterior chest wall. Adventitious sounds - No Adventitious sounds.  Cardiovascular Auscultation Rhythm - Regular rate and rhythm. Heart  Sounds - S1 WNL and S2 WNL. Murmurs &  Other Heart Sounds - Auscultation of the heart reveals - No Murmurs.  Abdomen Palpation/Percussion Tenderness - Abdomen is non-tender to palpation. Abdomen is soft. Auscultation Auscultation of the abdomen reveals - Bowel sounds normal.  Genitourinary Note: Not done, not pertinent to present illness  Musculoskeletal Evaluation of the left hip shows flexion to 120 rotation in 30 out 40 and abduction 40 without discomfort. There is no tenderness over the greater trochanter. There is no pain on provocative testing of the hip.Examination of the right hip shows flexion to 120 rotation in 30 abduction 40 and external rotation of 40. There is no tenderness over the greater trochanter. There is no pain on provocative testing of the hip.Marland Kitchen Her RIGHT knee shows no effusion. Range of motion is 0-125. She does has some slight varus valgus laxity in extension as well as AP laxity in flexion. There is no gross instability. LEFT knee shows no effusion. Range 0-125 and no tenderness or instability.  Radiographs-AP and lateral of both knees show both prostheses in excellent position with no periprosthetic abnormalities. There is no evidence of any wear or lysis.   Assessment / Plan 1. History of right total knee replacement Z96.651: Presence of right artificial knee joint  2. Pain in right knee M25.561: Pain in right knee  3. Instability of joint M25.30: Other instability, unspecified joint  Goals Patient Instructions Surgical Plans: Polyethylene Revision Right Total Knee versus Full Revision Right Total Knee Disposition: Home, Straight to outpatient therpay - [facility] PCP: Briscoe Deutscher, [place] IV TXA Anesthesia Issues: Nausea in the past Patient was instructed on what medications to stop prior to surgery. - Follow up visit in 2 weeks with Dr. Wynelle Link - Begin physical therapy following surgery - Provider to call patient to  setup first PT Eval on Monday 12/01/17 - Dr. Wynelle Link - Revision Right Total Knee - Pre-operative lab work as pre Pre-Surgical Testing - Prescriptions will be provided in hospital at time of discharge  Anticipated LOS equal to or greater than 2 midnights due to - Age 45 and older with one or more of the following:             - Obesity             - Expected need for hospital services (PT, OT, Nursing) required for safe      discharge             - Anticipated need for postoperative skilled nursing care or inpatient rehab             - Active co-morbidities: None    Return to Office PATRICIA POFF, DPT for Physical Therapy Evaluation at [facility] on 12/01/2017 at 11:30 AM Gaynelle Arabian, MD for 5-Post-Op at [facility] on 12/09/2017 at 01:30 PM  Encounter signed-off by Mickel Crow, PA-C

## 2017-11-03 ENCOUNTER — Encounter: Payer: Self-pay | Admitting: Family Medicine

## 2017-11-04 ENCOUNTER — Telehealth: Payer: Self-pay

## 2017-11-04 MED ORDER — RALOXIFENE HCL 60 MG PO TABS: 60.0000 mg | ORAL_TABLET | Freq: Every day | ORAL | 0 refills | Status: DC

## 2017-11-04 NOTE — Telephone Encounter (Signed)
Patient informed will call for lab app 1 month after surgery.

## 2017-11-04 NOTE — Telephone Encounter (Signed)
-----   Message from Briscoe Deutscher, DO sent at 11/04/2017  7:27 AM EDT ----- I sent in the Evista that we discussed at her visit. A month after surgery, come in for recheck labs (future orders added already). I labs okay, we will call to start the medication.

## 2017-11-04 NOTE — Addendum Note (Signed)
Addended by: Briscoe Deutscher R on: 11/04/2017 07:30 AM   Modules accepted: Orders

## 2017-11-11 DIAGNOSIS — M47814 Spondylosis without myelopathy or radiculopathy, thoracic region: Secondary | ICD-10-CM | POA: Diagnosis not present

## 2017-11-18 ENCOUNTER — Telehealth: Payer: Self-pay

## 2017-11-18 NOTE — Telephone Encounter (Signed)
Received a notice from [employer] ([place]) asking if they should determine eligibility for this patient. I see in my previous phone note she was going to call back and schedule after I discussed her cost with her but she has not yet. Do you know if this is something she is still interested in? I can have them check what her cost would be this time as it was last year when we submitted.

## 2017-11-18 NOTE — Patient Instructions (Signed)
Delila Kuklinski Berwanger  11/18/2017    Your procedure is scheduled on:   11-26-2017  Report to [facility] Main  Entrance  Report to admitting at   11:15 AM    Call this number if you have problems the morning of surgery [phone]   Remember: Do not eat food After Midnight  With exception Clear liquid diet from midnight until 7:30 AM day of surgery,  Nothing by mouth after 7:30 AM day of surgery     Take these medicines the morning of surgery with A SIP OF WATER:  Advair inhaler, Nexium, Wellbutrin                                You may not have any metal on your body including hair pins and              piercings  Do not wear jewelry, make-up, lotions, powders or perfumes, deodorant             Do not wear nail polish.  Do not shave  48 hours prior to surgery.                 Do not bring valuables to the hospital. [place].  Contacts, dentures or bridgework may not be worn into surgery.  Leave suitcase in the car. After surgery it may be brought to your room.     Special Instructions:  Cough/ Deep breathing and leg exercises              Please read over the following fact sheets you were given: _____________________________________________________________________                CLEAR LIQUID DIET   Foods Allowed                                                                     Foods Excluded  Coffee and tea, regular and decaf                             liquids that you cannot  Plain Jell-O in any flavor                                             see through such as: Fruit ices (not with fruit pulp)                                     milk, soups, orange juice  Iced Popsicles                                    All solid food Carbonated beverages, regular and diet  Cranberry, grape and apple juices Sports drinks like Gatorade Lightly seasoned clear broth or  consume(fat free) Sugar, honey syrup  Sample Menu Breakfast                                Lunch                                     Supper Cranberry juice                    Beef broth                            Chicken broth Jell-O                                     Grape juice                           Apple juice Coffee or tea                        Jell-O                                      Popsicle                                                Coffee or tea                        Coffee or tea  _____________________________________________________________________  [facility] - Preparing for Surgery Before surgery, you can play an important role.  Because skin is not sterile, your skin needs to be as free of germs as possible.  You can reduce the number of germs on your skin by washing with CHG (chlorahexidine gluconate) soap before surgery.  CHG is an antiseptic cleaner which kills germs and bonds with the skin to continue killing germs even after washing. Please DO NOT use if you have an allergy to CHG or antibacterial soaps.  If your skin becomes reddened/irritated stop using the CHG and inform your nurse when you arrive at Short Stay. Do not shave (including legs and underarms) for at least 48 hours prior to the first CHG shower.  You may shave your face/neck. Please follow these instructions carefully:  1.  Shower with CHG Soap the night before surgery and the  morning of Surgery.  2.  If you choose to wash your hair, wash your hair first as usual with your  normal  shampoo.  3.  After you shampoo, rinse your hair and body thoroughly to remove the  shampoo.                           4.  Use CHG as you would any other liquid soap.  You can apply chg directly  to the skin and wash  Gently with a scrungie or clean washcloth.  5.  Apply the CHG Soap to your body ONLY FROM THE NECK DOWN.   Do not use on face/ open                           Wound or open sores.  Avoid contact with eyes, ears mouth and genitals (private parts).                       Wash face,  Genitals (private parts) with your normal soap.             6.  Wash thoroughly, paying special attention to the area where your surgery  will be performed.  7.  Thoroughly rinse your body with warm water from the neck down.  8.  DO NOT shower/wash with your normal soap after using and rinsing off  the CHG Soap.                9.  Pat yourself dry with a clean towel.            10.  Wear clean pajamas.            11.  Place clean sheets on your bed the night of your first shower and do not  sleep with pets. Day of Surgery : Do not apply any lotions/deodorants the morning of surgery.  Please wear clean clothes to the hospital/surgery center.  FAILURE TO FOLLOW THESE INSTRUCTIONS MAY RESULT IN THE CANCELLATION OF YOUR SURGERY PATIENT SIGNATURE_________________________________  NURSE SIGNATURE__________________________________  ________________________________________________________________________   Adam Phenix  An incentive spirometer is a tool that can help keep your lungs clear and active. This tool measures how well you are filling your lungs with each breath. Taking long deep breaths may help reverse or decrease the chance of developing breathing (pulmonary) problems (especially infection) following:  A long period of time when you are unable to move or be active. BEFORE THE PROCEDURE   If the spirometer includes an indicator to show your best effort, your nurse or respiratory therapist will set it to a desired goal.  If possible, sit up straight or lean slightly forward. Try not to slouch.  Hold the incentive spirometer in an upright position. INSTRUCTIONS FOR USE  1. Sit on the edge of your bed if possible, or sit up as far as you can in bed or on a chair. 2. Hold the incentive spirometer in an upright position. 3. Breathe out normally. 4. Place the mouthpiece in your  mouth and seal your lips tightly around it. 5. Breathe in slowly and as deeply as possible, raising the piston or the ball toward the top of the column. 6. Hold your breath for 3-5 seconds or for as long as possible. Allow the piston or ball to fall to the bottom of the column. 7. Remove the mouthpiece from your mouth and breathe out normally. 8. Rest for a few seconds and repeat Steps 1 through 7 at least 10 times every 1-2 hours when you are awake. Take your time and take a few normal breaths between deep breaths. 9. The spirometer may include an indicator to show your best effort. Use the indicator as a goal to work toward during each repetition. 10. After each set of 10 deep breaths, practice coughing to be sure your lungs are clear. If you have an incision (the cut made at the time of  surgery), support your incision when coughing by placing a pillow or rolled up towels firmly against it. Once you are able to get out of bed, walk around indoors and cough well. You may stop using the incentive spirometer when instructed by your caregiver.  RISKS AND COMPLICATIONS  Take your time so you do not get dizzy or light-headed.  If you are in pain, you may need to take or ask for pain medication before doing incentive spirometry. It is harder to take a deep breath if you are having pain. AFTER USE  Rest and breathe slowly and easily.  It can be helpful to keep track of a log of your progress. Your caregiver can provide you with a simple table to help with this. If you are using the spirometer at home, follow these instructions: [place] IF:   You are having difficultly using the spirometer.  You have trouble using the spirometer as often as instructed.  Your pain medication is not giving enough relief while using the spirometer.  You develop fever of 100.5 F (38.1 C) or higher. SEEK IMMEDIATE MEDICAL CARE IF:   You cough up bloody sputum that had not been present before.  You  develop fever of 102 F (38.9 C) or greater.  You develop worsening pain at or near the incision site. MAKE SURE YOU:   Understand these instructions.  Will watch your condition.  Will get help right away if you are not doing well or get worse. Document Released: 11/11/2006 Document Revised: 09/23/2011 Document Reviewed: 01/12/2007 ExitCare Patient Information 2014 ExitCare, [place].   ________________________________________________________________________  WHAT IS A BLOOD TRANSFUSION? Blood Transfusion Information  A transfusion is the replacement of blood or some of its parts. Blood is made up of multiple cells which provide different functions.  Red blood cells carry oxygen and are used for blood loss replacement.  White blood cells fight against infection.  Platelets control bleeding.  Plasma helps clot blood.  Other blood products are available for specialized needs, such as hemophilia or other clotting disorders. BEFORE THE TRANSFUSION  Who gives blood for transfusions?   Healthy volunteers who are fully evaluated to make sure their blood is safe. This is blood bank blood. Transfusion therapy is the safest it has ever been in the practice of medicine. Before blood is taken from a donor, a complete history is taken to make sure that person has no history of diseases nor engages in risky social behavior (examples are intravenous drug use or sexual activity with multiple partners). The donor's travel history is screened to minimize risk of transmitting infections, such as malaria. The donated blood is tested for signs of infectious diseases, such as HIV and hepatitis. The blood is then tested to be sure it is compatible with you in order to minimize the chance of a transfusion reaction. If you or a relative donates blood, this is often done in anticipation of surgery and is not appropriate for emergency situations. It takes many days to process the donated blood. RISKS AND  COMPLICATIONS Although transfusion therapy is very safe and saves many lives, the main dangers of transfusion include:   Getting an infectious disease.  Developing a transfusion reaction. This is an allergic reaction to something in the blood you were given. Every precaution is taken to prevent this. The decision to have a blood transfusion has been considered carefully by your caregiver before blood is given. Blood is not given unless the benefits outweigh the risks. AFTER THE  TRANSFUSION  Right after receiving a blood transfusion, you will usually feel much better and more energetic. This is especially true if your red blood cells have gotten low (anemic). The transfusion raises the level of the red blood cells which carry oxygen, and this usually causes an energy increase.  The nurse administering the transfusion will monitor you carefully for complications. HOME CARE INSTRUCTIONS  No special instructions are needed after a transfusion. You may find your energy is better. Speak with your caregiver about any limitations on activity for underlying diseases you may have. SEEK MEDICAL CARE IF:   Your condition is not improving after your transfusion.  You develop redness or irritation at the intravenous (IV) site. SEEK IMMEDIATE MEDICAL CARE IF:  Any of the following symptoms occur over the next 12 hours:  Shaking chills.  You have a temperature by mouth above 102 F (38.9 C), not controlled by medicine.  Chest, back, or muscle pain.  People around you feel you are not acting correctly or are confused.  Shortness of breath or difficulty breathing.  Dizziness and fainting.  You get a rash or develop hives.  You have a decrease in urine output.  Your urine turns a dark color or changes to pink, red, or brown. Any of the following symptoms occur over the next 10 days:  You have a temperature by mouth above 102 F (38.9 C), not controlled by medicine.  Shortness of  breath.  Weakness after normal activity.  The white part of the eye turns yellow (jaundice).  You have a decrease in the amount of urine or are urinating less often.  Your urine turns a dark color or changes to pink, red, or brown. Document Released: 06/28/2000 Document Revised: 09/23/2011 Document Reviewed: 02/15/2008 [facility] Patient Information 2014 [place].

## 2017-11-19 ENCOUNTER — Encounter (HOSPITAL_COMMUNITY): Payer: Self-pay

## 2017-11-19 ENCOUNTER — Encounter (HOSPITAL_COMMUNITY)
Admission: RE | Admit: 2017-11-19 | Discharge: 2017-11-19 | Disposition: A | Discharge status: Home / Self Care | Payer: PPO | Source: Ambulatory Visit | Attending: Orthopedic Surgery | Admitting: Orthopedic Surgery

## 2017-11-19 ENCOUNTER — Other Ambulatory Visit: Payer: Self-pay

## 2017-11-19 DIAGNOSIS — T84092A Other mechanical complication of internal right knee prosthesis, initial encounter: Secondary | ICD-10-CM | POA: Diagnosis not present

## 2017-11-19 DIAGNOSIS — Z01812 Encounter for preprocedural laboratory examination: Secondary | ICD-10-CM | POA: Insufficient documentation

## 2017-11-19 DIAGNOSIS — Z800 Family history of malignant neoplasm of digestive organs: Secondary | ICD-10-CM | POA: Diagnosis not present

## 2017-11-19 DIAGNOSIS — K5902 Outlet dysfunction constipation: Secondary | ICD-10-CM | POA: Diagnosis not present

## 2017-11-19 DIAGNOSIS — K220 Achalasia of cardia: Secondary | ICD-10-CM | POA: Diagnosis not present

## 2017-11-19 DIAGNOSIS — K219 Gastro-esophageal reflux disease without esophagitis: Secondary | ICD-10-CM | POA: Diagnosis not present

## 2017-11-19 DIAGNOSIS — R198 Other specified symptoms and signs involving the digestive system and abdomen: Secondary | ICD-10-CM | POA: Diagnosis not present

## 2017-11-19 HISTORY — DX: Other specified postprocedural states: R11.2

## 2017-11-19 HISTORY — DX: Spondylosis, unspecified: M47.9

## 2017-11-19 HISTORY — DX: Nausea with vomiting, unspecified: R11.2

## 2017-11-19 HISTORY — DX: Family history of other specified conditions: Z84.89

## 2017-11-19 HISTORY — DX: Personal history of other diseases of the digestive system: Z87.19

## 2017-11-19 HISTORY — DX: Unspecified asthma, uncomplicated: J45.909

## 2017-11-19 HISTORY — DX: Dyskinesia of esophagus: K22.4

## 2017-11-19 HISTORY — DX: Other specified postprocedural states: Z98.890

## 2017-11-19 HISTORY — DX: Lesion of ulnar nerve, bilateral upper limbs: G56.23

## 2017-11-19 HISTORY — DX: Lactose intolerance, unspecified: E73.9

## 2017-11-19 HISTORY — DX: Achalasia of cardia: K22.0

## 2017-11-19 HISTORY — DX: Other chronic pain: G89.29

## 2017-11-19 HISTORY — DX: Unspecified osteoarthritis, unspecified site: M19.90

## 2017-11-19 HISTORY — DX: Essential (primary) hypertension: I10

## 2017-11-19 HISTORY — DX: Stress incontinence (female) (male): N39.3

## 2017-11-19 HISTORY — DX: Other constipation: K59.09

## 2017-11-19 HISTORY — DX: Other specified diseases of the digestive system: K92.89

## 2017-11-19 HISTORY — DX: Dorsalgia, unspecified: M54.9

## 2017-11-19 HISTORY — DX: Gastro-esophageal reflux disease without esophagitis: K21.9

## 2017-11-19 HISTORY — DX: Paresthesia of skin: R20.2

## 2017-11-19 LAB — SURGICAL PCR SCREEN
MRSA, PCR: INVALID — AB
Staphylococcus aureus: INVALID — AB

## 2017-11-19 LAB — COMPREHENSIVE METABOLIC PANEL
ALBUMIN: 4 g/dL (ref 3.5–5.0)
ALK PHOS: 64 U/L (ref 38–126)
ALT: 24 U/L (ref 14–54)
ANION GAP: 11 (ref 5–15)
AST: 21 U/L (ref 15–41)
BUN: 10 mg/dL (ref 6–20)
CALCIUM: 9.5 mg/dL (ref 8.9–10.3)
CO2: 26 mmol/L (ref 22–32)
Chloride: 100 mmol/L — ABNORMAL LOW (ref 101–111)
Creatinine, Ser: 0.84 mg/dL (ref 0.44–1.00)
GFR calc Af Amer: >60 mL/min (ref >60)
GFR calc non Af Amer: >60 mL/min (ref >60)
GLUCOSE: 104 mg/dL — AB (ref 65–99)
POTASSIUM: 4.6 mmol/L (ref 3.5–5.1)
Sodium: 137 mmol/L (ref 135–145)
Total Bilirubin: 0.5 mg/dL (ref 0.3–1.2)
Total Protein: 6.6 g/dL (ref 6.5–8.1)

## 2017-11-19 LAB — PROTIME-INR
INR: 0.97
Prothrombin Time: 12.8 seconds (ref 11.4–15.2)

## 2017-11-19 LAB — CBC
HCT: 43 % (ref 36.0–46.0)
Hemoglobin: 14.6 g/dL (ref 12.0–15.0)
MCH: 31.5 pg (ref 26.0–34.0)
MCHC: 34 g/dL (ref 30.0–36.0)
MCV: 92.9 fL (ref 78.0–100.0)
Platelets: 215 10*3/uL (ref 150–400)
RBC: 4.63 MIL/uL (ref 3.87–5.11)
RDW: 13 % (ref 11.5–15.5)
WBC: 7.9 10*3/uL (ref 4.0–10.5)

## 2017-11-19 LAB — APTT: APTT: 26 s (ref 24–36)

## 2017-11-19 NOTE — Progress Notes (Signed)
EKG dated 10-28-2017 in epic.  Called and spoke w/ pt via phone to inform her surgery start time has been changed to 1015 and to arrive at [place] at [place], pt verbalized understanding.

## 2017-11-19 NOTE — Telephone Encounter (Signed)
Sending to you guys to look into.

## 2017-11-20 ENCOUNTER — Encounter: Payer: Self-pay | Admitting: Obstetrics and Gynecology

## 2017-11-20 ENCOUNTER — Other Ambulatory Visit: Payer: Self-pay

## 2017-11-20 ENCOUNTER — Ambulatory Visit (INDEPENDENT_AMBULATORY_CARE_PROVIDER_SITE_OTHER): Payer: PPO | Admitting: Obstetrics and Gynecology

## 2017-11-20 ENCOUNTER — Encounter

## 2017-11-20 VITALS — BP 124/70 | HR 64 | Resp 14 | Ht 63.0 in | Wt 158.0 lb

## 2017-11-20 DIAGNOSIS — Z01419 Encounter for gynecological examination (general) (routine) without abnormal findings: Secondary | ICD-10-CM

## 2017-11-20 DIAGNOSIS — N816 Rectocele: Secondary | ICD-10-CM | POA: Diagnosis not present

## 2017-11-20 DIAGNOSIS — N951 Menopausal and female climacteric states: Secondary | ICD-10-CM

## 2017-11-20 DIAGNOSIS — Z124 Encounter for screening for malignant neoplasm of cervix: Secondary | ICD-10-CM

## 2017-11-20 DIAGNOSIS — Z803 Family history of malignant neoplasm of breast: Secondary | ICD-10-CM | POA: Diagnosis not present

## 2017-11-20 DIAGNOSIS — M8580 Other specified disorders of bone density and structure, unspecified site: Secondary | ICD-10-CM | POA: Insufficient documentation

## 2017-11-20 MED ORDER — GABAPENTIN 100 MG PO CAPS: ORAL_CAPSULE | ORAL | 1 refills | Status: DC

## 2017-11-20 NOTE — Progress Notes (Signed)
67 y.o. [id number] MarriedCaucasianF here for annual exam. No vaginal bleeding. Since she was 46 she has been having bad hot flashes and night sweats. She is up 4 or more times at night with sweats. Can soak through her clothes at times. Not sleeping well. She was HRT in the past, she went off in 2015, just wanted to be off. She has felt miserable since. She worked with a Tax adviser out of state. He did hormone testing, her levels were all very low. She was taking a compounded cream of estrogen, testosterone, progesterone and dhea. She had too much testosterone, started getting thick hair growth on her legs. She stopped the cream last spring.  Not sexually active for a while. Both okay with it.  She has a h/o a rectocele, h/o constipation. Sometimes needs to reduce the rectocele to have a BM. She has a BM every 3 days. No baseline symptoms from the rectocele.       Patient's last menstrual period was 09/23/2012.          Sexually active: Yes.    The current method of family planning is post menopausal status.    Exercising: Yes.    deep water aerobics/weights/ walking Smoker:  no  Health Maintenance: Pap:  unsure History of abnormal Pap:  no MMG:   2018 WNL per patient [place] Colonoscopy:  09/2015 Normal per patient [place] BMD:   08/2016 Osteopenia per patient [place] TDaP:  Unsure  Gardasil: N/A   reports that she has never smoked. She has never used smokeless tobacco. She reports that she drinks about 3.6 - 4.8 oz of alcohol per week. She reports that she does not use drugs. Retired. 2 kids, both here. No grand children yet.   Past Medical History:  Diagnosis Date  . Achalasia of esophagus    s/p  heller myotomy hiatal hernia repair 2006  and x3  dilatation's  . Anemia   . Asthma   . Chronic back pain    lumbar and thoracic  . Chronic constipation   . Cubital tunnel syndrome, bilateral   . Degenerative arthritis of spine   . Esophageal dysmotility    ultrasound 11-15-2011  . Family history of adverse  reaction to anesthesia    sister and mother-- ponv  . Gastrointestinal dysmotility   . GERD (gastroesophageal reflux disease)   . Hand tingling    fingers bilateral hand due to bilateral cubital tunnel syndrome  . History of esophageal spasm    s/p  esophageal dilation  . History of hiatal hernia    s/p  repair 2006  . Hypercholesterolemia 2009  . Hypertension   . Lactose intolerance   . 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 OA (osteoarthritis)   . Panic disorder   . Polyarthralgia   . PONV (postoperative nausea and vomiting)   . Raynauds syndrome   . Scoliosis of thoracic spine 8/01  . Seasonal affective disorder ([place])   . SUI (stress urinary incontinence, female)   . Superficial thrombophlebitis    x2  left lower leg superficial in  2006  prior to vein stripping     Past Surgical History:  Procedure Laterality Date  . ABDOMINAL SURGERY    . D & C HYSTEROSCOPY W/ RESECTION FIBROID  09-21-2002  dr Mohammed Kindle  [place] W/ RESECTION POLYP  12-24-2010  dr Joan Flores  [facility]  . DILATION AND CURETTAGE OF UTERUS    . ESOPHAGEAL DILATION  2013  ---  [facility]   Omega dilation for Achalasia  . ESOPHAGOGASTRODUODENOSCOPY N/A 09/20/2017   Procedure: ESOPHAGOGASTRODUODENOSCOPY (EGD);  Surgeon: Clarene Essex, MD;  Location: [facility];  Service: Endoscopy;  Laterality: N/A;  . HELLER MYOTOMY  5053   fundoplication repair hiatal hernia  . KNEE ARTHROSCOPY Left 2006 &  09-27-2003  . KNEE ARTHROSCOPY W/ SYNOVECTOMY Right 09-20-2009  dr Wynelle Link  [facility]  . KNEE SURGERY Right 1962  . ROTATOR CUFF REPAIR Right 09/2011  . TONSILLECTOMY AND ADENOIDECTOMY  1961  . TOTAL KNEE ARTHROPLASTY Bilateral left 02-05-2006 ;  right 08-14-2007   dr Wynelle Link  [facility]  . VARICOSE VEIN SURGERY  09/2005    Current Outpatient Medications  Medication Sig Dispense Refill  . buPROPion (WELLBUTRIN XL) 150 MG 24 hr tablet Take 1 tablet (150 mg total) by mouth daily. (Patient taking differently: Take 150 mg by mouth every  morning. ) 90 tablet 3  . CALCIUM PO Take 15 mLs by mouth daily.    . Cholecalciferol (VITAMIN D3) 1000 units CAPS Take 1,000 Units by mouth daily.    Marland Kitchen docusate sodium (COLACE) 100 MG capsule Take 100 mg by mouth 2 (two) times daily as needed for mild constipation.    Marland Kitchen esomeprazole (NEXIUM) 40 MG capsule Take 40 mg by mouth 2 (two) times daily. Take 40 mg by mouth daily. May take additional 40 mg if needed for acid reflux    . Fluticasone-Salmeterol (ADVAIR DISKUS) 250-50 MCG/DOSE AEPB Inhale 1 puff into the lungs 2 (two) times daily.     . hydrochlorothiazide (MICROZIDE) 12.5 MG capsule Take 1 capsule (12.5 mg total) by mouth daily. (Patient taking differently: Take 12.5 mg by mouth every morning. ) 30 capsule 3  . Polyethylene Glycol 3350 (MIRALAX PO) Take by mouth as needed.    . raloxifene (EVISTA) 60 MG tablet Take 1 tablet (60 mg total) by mouth daily. (Patient not taking: Reported on 11/20/2017) 90 tablet 0   No current facility-administered medications for this visit.     Family History  Problem Relation Age of Onset  . Hypertension Father   . Heart disease Father   . Stroke Father   . Cancer Father        Testicular, Bladder, Prostate, Lung  . Heart disease Mother   . Stroke Mother   . Osteoporosis Mother   . Uterine cancer Sister 68  . Breast cancer Sister   . Cancer Sister        rectal, liver,lymph nodes  . Diabetes Maternal Grandfather   . Heart disease Maternal Grandfather   . Diabetes Paternal Grandmother   . Breast cancer Maternal Aunt     Review of Systems  Constitutional: Negative.   HENT: Negative.   Eyes: Negative.   Respiratory: Negative.   Cardiovascular: Negative.   Gastrointestinal: Negative.   Endocrine: Negative.   Genitourinary:       Hot flashes  Night sweats  Musculoskeletal: Negative.   Skin: Negative.   Allergic/Immunologic: Negative.   Neurological: Negative.   Psychiatric/Behavioral: Negative.     Exam:   BP 124/70 (BP Location:  Right Arm, Patient Position: Sitting, Cuff Size: Normal)   Pulse 64   Resp 14   Ht 5\' 3"  (1.6 m)   Wt 158 lb (71.7 kg)   LMP 09/23/2012   BMI 27.99 kg/m   Weight change: @WEIGHTCHANGE @ Height:   Height: 5\' 3"  (160 cm)  Ht Readings from Last 3 Encounters:  11/20/17 5\' 3"  (1.6 m)  11/19/17 5\' 3"  (1.6  m)  10/29/17 5\' 3"  (1.6 m)    General appearance: alert, cooperative and appears stated age Head: Normocephalic, without obvious abnormality, atraumatic Neck: no adenopathy, supple, symmetrical, trachea midline and thyroid normal to inspection and palpation Lungs: clear to auscultation bilaterally Cardiovascular: regular rate and rhythm Breasts: normal appearance, no masses or tenderness Abdomen: soft, non-tender; non distended,  no masses,  no organomegaly Extremities: extremities normal, atraumatic, no cyanosis or edema Skin: Skin color, texture, turgor normal. No rashes or lesions Lymph nodes: Cervical, supraclavicular, and axillary nodes normal. No abnormal inguinal nodes palpated Neurologic: Grossly normal   Pelvic: External genitalia:  no lesions              Urethra:  normal appearing urethra with no masses, tenderness or lesions              Bartholins and Skenes: normal                 Vagina: atrophic appearing vagina with normal color and discharge, no lesions. Small grade 2 rectocele with valsalva              Cervix: no lesions               Bimanual Exam:  Uterus:  normal size, contour, position, consistency, mobility, non-tender              Adnexa: no mass, fullness, tenderness               Rectovaginal: Confirms               Anus:  normal sphincter tone, no lesions  Chaperone was present for exam.  A:  Well Woman with normal exam  FH of breast, uterine cancer  Severe vasomotor symptoms  Having a knee replacement  Osteopenia, has a script for Evista to start after her knee replacement  Rectocele, minimal symptoms. Long term constipation issues  P:   Pap  with hpv  3D Mammogram  Colonoscopy every 3 years, UTD  Discussed breast self exam  Discussed calcium and vit D intake  Set up a consultation with Genetics  Start Gabapentin for vasomotor symptoms, after her knee replacement and genetics consultation, we can further discuss HRT  Discussed that Evista can worsen vasomotor symptoms.   F/U in one month  CC: Dr [name]

## 2017-11-20 NOTE — Patient Instructions (Addendum)
EXERCISE AND DIET:  We recommended that you start or continue a regular exercise program for good health. Regular exercise means any activity that makes your heart beat faster and makes you sweat.  We recommend exercising at least 30 minutes per day at least 3 days a week, preferably 4 or 5.  We also recommend a diet low in fat and sugar.  Inactivity, poor dietary choices and obesity can cause diabetes, heart attack, stroke, and kidney damage, among others.    ALCOHOL AND SMOKING:  Women should limit their alcohol intake to no more than 7 drinks/beers/glasses of wine (combined, not each!) per week. Moderation of alcohol intake to this level decreases your risk of breast cancer and liver damage. And of course, no recreational drugs are part of a healthy lifestyle.  And absolutely no smoking or even second hand smoke. Most people know smoking can cause heart and lung diseases, but did you know it also contributes to weakening of your bones? Aging of your skin?  Yellowing of your teeth and nails?  CALCIUM AND VITAMIN D:  Adequate intake of calcium and Vitamin D are recommended.  The recommendations for exact amounts of these supplements seem to change often, but generally speaking 600 mg of calcium (either carbonate or citrate) and 800 units of Vitamin D per day seems prudent. Certain women may benefit from higher intake of Vitamin D.  If you are among these women, your doctor will have told you during your visit.    PAP SMEARS:  Pap smears, to check for cervical cancer or precancers,  have traditionally been done yearly, although recent scientific advances have shown that most women can have pap smears less often.  However, every woman still should have a physical exam from her gynecologist every year. It will include a breast check, inspection of the vulva and vagina to check for abnormal growths or skin changes, a visual exam of the cervix, and then an exam to evaluate the size and shape of the uterus and  ovaries.  And after 66 years of age, a rectal exam is indicated to check for rectal cancers. We will also provide age appropriate advice regarding health maintenance, like when you should have certain vaccines, screening for sexually transmitted diseases, bone density testing, colonoscopy, mammograms, etc.   MAMMOGRAMS:  All women over 40 years old should have a yearly mammogram. Many facilities now offer a "3D" mammogram, which may cost around $50 extra out of pocket. If possible,  we recommend you accept the option to have the 3D mammogram performed.  It both reduces the number of women who will be called back for extra views which then turn out to be normal, and it is better than the routine mammogram at detecting truly abnormal areas.    COLONOSCOPY:  Colonoscopy to screen for colon cancer is recommended for all women at age 50.  We know, you hate the idea of the prep.  We agree, BUT, having colon cancer and not knowing it is worse!!  Colon cancer so often starts as a polyp that can be seen and removed at colonscopy, which can quite literally save your life!  And if your first colonoscopy is normal and you have no family history of colon cancer, most women don't have to have it again for 10 years.  Once every ten years, you can do something that may end up saving your life, right?  We will be happy to help you get it scheduled when you are ready.    Be sure to check your insurance coverage so you understand how much it will cost.  It may be covered as a preventative service at no cost, but you should check your particular policy.      Breast Self-Awareness Breast self-awareness means being familiar with how your breasts look and feel. It involves checking your breasts regularly and reporting any changes to your health care provider. Practicing breast self-awareness is important. A change in your breasts can be a sign of a serious medical problem. Being familiar with how your breasts look and feel allows  you to find any problems early, when treatment is more likely to be successful. All women should practice breast self-awareness, including women who have had breast implants. How to do a breast self-exam One way to learn what is normal for your breasts and whether your breasts are changing is to do a breast self-exam. To do a breast self-exam: Look for Changes  1. Remove all the clothing above your waist. 2. Stand in front of a mirror in a room with good lighting. 3. Put your hands on your hips. 4. Push your hands firmly downward. 5. Compare your breasts in the mirror. Look for differences between them (asymmetry), such as: ? Differences in shape. ? Differences in size. ? Puckers, dips, and bumps in one breast and not the other. 6. Look at each breast for changes in your skin, such as: ? Redness. ? Scaly areas. 7. Look for changes in your nipples, such as: ? Discharge. ? Bleeding. ? Dimpling. ? Redness. ? A change in position. Feel for Changes  Carefully feel your breasts for lumps and changes. It is best to do this while lying on your back on the floor and again while sitting or standing in the shower or tub with soapy water on your skin. Feel each breast in the following way:  Place the arm on the side of the breast you are examining above your head.  Feel your breast with the other hand.  Start in the nipple area and make  inch (2 cm) overlapping circles to feel your breast. Use the pads of your three middle fingers to do this. Apply light pressure, then medium pressure, then firm pressure. The light pressure will allow you to feel the tissue closest to the skin. The medium pressure will allow you to feel the tissue that is a little deeper. The firm pressure will allow you to feel the tissue close to the ribs.  Continue the overlapping circles, moving downward over the breast until you feel your ribs below your breast.  Move one finger-width toward the center of the body.  Continue to use the  inch (2 cm) overlapping circles to feel your breast as you move slowly up toward your collarbone.  Continue the up and down exam using all three pressures until you reach your armpit.  Write Down What You Find  Write down what is normal for each breast and any changes that you find. Keep a written record with breast changes or normal findings for each breast. By writing this information down, you do not need to depend only on memory for size, tenderness, or location. Write down where you are in your menstrual cycle, if you are still menstruating. If you are having trouble noticing differences in your breasts, do not get discouraged. With time you will become more familiar with the variations in your breasts and more comfortable with the exam. How often should I examine my breasts? Examine   your breasts every month. If you are breastfeeding, the best time to examine your breasts is after a feeding or after using a breast pump. If you menstruate, the best time to examine your breasts is 5-7 days after your period is over. During your period, your breasts are lumpier, and it may be more difficult to notice changes. When should I see my health care provider? See your health care provider if you notice:  A change in shape or size of your breasts or nipples.  A change in the skin of your breast or nipples, such as a reddened or scaly area.  Unusual discharge from your nipples.  A lump or thick area that was not there before.  Pain in your breasts.  Anything that concerns you.  This information is not intended to replace advice given to you by your health care provider. Make sure you discuss any questions you have with your health care provider. Document Released: 07/01/2005 Document Revised: 12/07/2015 Document Reviewed: 05/21/2015 Elsevier Interactive Patient Education  2018 [employer]. About Rectocele  Overview  A rectocele is a type of hernia which causes  different degrees of bulging of the rectal tissues into the vaginal wall.  You may even notice that it presses against the vaginal wall so much that some vaginal tissues droop outside of the opening of your vagina.  Causes of Rectocele  The most common cause is childbirth.  The muscles and ligaments in the pelvis that hold up and support the female organs and vagina become stretched and weakened during labor and delivery.  The more babies you have, the more the support tissues are stretched and weakened.  Not everyone who has a baby will develop a rectocele.  Some women have stronger supporting tissue in the pelvis and may not have as much of a problem as others.  Women who have a Cesarean section usually do not get rectocele's unless they pushed a long time prior to the cesarean delivery.  Other conditions that can cause a rectocele include chronic constipation, a chronic cough, a lot of heavy lifting, and obesity.  Older women may have this problem because the loss of female hormones causes the vaginal tissue to become weaker.  Symptoms  There may not be any symptoms.  If you do have symptoms, they may include:  Pelvic pressure in the rectal area  Protrusion of the lower part of the vagina through the opening of the vagina  Constipation and trapping of the stool, making it difficult to have a bowel movement.  In severe cases, you may have to press on the lower part of your vagina to help push the stool out of you rectum.  This is called splinting to empty.  Diagnosing Rectocele  Your health care provider will ask about your symptoms and perform a pelvic exam.  S/he will ask you to bear down, pushing like you are having a bowel movement so as to see how far the lower part of the vagina protrudes into the vagina and possible outside of the vagina.  Your provider will also ask you to contract the muscles of your pelvis (like you are stopping the stream in the middle of urinating) to determine the  strength of your pelvic muscles.  Your provider may also do a rectal exam.  Treatment Options  If you do not have any symptoms, no treatment may be necessary.  Other treatment options include:  Pelvic floor exercises: Contracting the muscles in your genital area may help  strengthen your muscles and support the organs.  Be sure to get proper exercise instruction from you physical therapist.  A pessary (removealbe pelvic support device) sometimes helps rectocele symptoms.  Surgery: Surgical repair may be necessary. In some cases the uterus may need to be taken out ( a hysterectomy) as well.  There are many types of surgery for pelvic support problems.  Look for physicians who specialize in repair procedures.  You can take care of yourself by:  Treating and preventing constipation  Avoiding heavy lifting, and lifting correctly (with your legs, not with you waist or back)  Treating a chronic cough or bronchitis  Not smoking  avoiding too much weight gain  Doing pelvic floor exercises   2007, [employer] Doc.33

## 2017-11-20 NOTE — Telephone Encounter (Signed)
My chart message sent to patient to see if she still wants.

## 2017-11-21 ENCOUNTER — Other Ambulatory Visit (HOSPITAL_COMMUNITY)
Admission: RE | Admit: 2017-11-21 | Discharge: 2017-11-21 | Disposition: A | Discharge status: Home / Self Care | Payer: PPO | Source: Ambulatory Visit | Attending: Obstetrics and Gynecology | Admitting: Obstetrics and Gynecology

## 2017-11-21 DIAGNOSIS — Z01419 Encounter for gynecological examination (general) (routine) without abnormal findings: Secondary | ICD-10-CM | POA: Diagnosis not present

## 2017-11-21 LAB — MRSA CULTURE: CULTURE: NOT DETECTED

## 2017-11-21 NOTE — Addendum Note (Signed)
Addended by: Dorothy Spark on: 11/21/2017 04:44 PM   Modules accepted: Orders

## 2017-11-25 LAB — CYTOLOGY - PAP
DIAGNOSIS: NEGATIVE
HPV (WINDOPATH): NOT DETECTED

## 2017-11-25 MED ORDER — BUPIVACAINE LIPOSOME 1.3 % IJ SUSP
20.0000 mL | INTRAMUSCULAR | Status: DC
  Filled 2017-11-25: qty 20

## 2017-11-25 MED ORDER — TRANEXAMIC ACID 1000 MG/10ML IV SOLN
1000.0000 mg | INTRAVENOUS | Status: AC
  Administered 2017-11-26: 1000 mg via INTRAVENOUS
  Filled 2017-11-25: qty 1100

## 2017-11-25 NOTE — Anesthesia Preprocedure Evaluation (Addendum)
Anesthesia Evaluation  Patient identified by MRN, date of birth, ID band Patient awake    Reviewed: Allergy & Precautions, NPO status , Patient's Chart, lab work & pertinent test results  History of Anesthesia Complications (+) PONV, Emergence Delirium, Family history of anesthesia reaction and history of anesthetic complications  Airway Mallampati: II  TM Distance: >3 FB Neck ROM: Full    Dental  (+) Caps, Teeth Intact   Pulmonary asthma ,    Pulmonary exam normal breath sounds clear to auscultation       Cardiovascular hypertension, + Peripheral Vascular Disease  Normal cardiovascular exam Rhythm:Regular Rate:Normal     Neuro/Psych PSYCHIATRIC DISORDERS Anxiety Depression negative neurological ROS  negative psych ROS   GI/Hepatic Neg liver ROS, hiatal hernia, GERD  ,  Endo/Other  negative endocrine ROS  Renal/GU negative Renal ROS     Musculoskeletal negative musculoskeletal ROS (+) Arthritis ,   Abdominal   Peds  Hematology  (+) anemia ,   Anesthesia Other Findings   Reproductive/Obstetrics negative OB ROS                            Anesthesia Physical Anesthesia Plan  ASA: II  Anesthesia Plan: General   Post-op Pain Management: GA combined w/ Regional for post-op pain   Induction: Intravenous, Rapid sequence and Cricoid pressure planned  PONV Risk Score and Plan: 4 or greater and Ondansetron, Dexamethasone, Propofol infusion and Treatment may vary due to age or medical condition  Airway Management Planned: Oral ETT  Additional Equipment:   Intra-op Plan:   Post-operative Plan: Extubation in OR  Informed Consent: I have reviewed the patients History and Physical, chart, labs and discussed the procedure including the risks, benefits and alternatives for the proposed anesthesia with the patient or authorized representative who has indicated his/her understanding and  acceptance.   Dental advisory given  Plan Discussed with: CRNA  Anesthesia Plan Comments:       Anesthesia Quick Evaluation

## 2017-11-26 ENCOUNTER — Inpatient Hospital Stay (HOSPITAL_COMMUNITY): Payer: PPO | Admitting: Anesthesiology

## 2017-11-26 ENCOUNTER — Encounter (HOSPITAL_COMMUNITY)
Admission: RE | Disposition: A | Discharge status: Home / Self Care | Payer: Self-pay | Source: Ambulatory Visit | Attending: Orthopedic Surgery

## 2017-11-26 ENCOUNTER — Encounter (HOSPITAL_COMMUNITY): Payer: Self-pay

## 2017-11-26 ENCOUNTER — Encounter: Payer: Self-pay | Admitting: Genetic Counselor

## 2017-11-26 ENCOUNTER — Telehealth: Payer: Self-pay | Admitting: Genetic Counselor

## 2017-11-26 ENCOUNTER — Other Ambulatory Visit: Payer: Self-pay

## 2017-11-26 ENCOUNTER — Inpatient Hospital Stay (HOSPITAL_COMMUNITY)
Admission: RE | Admit: 2017-11-26 | Discharge: 2017-11-27 | DRG: 489 | Disposition: A | Discharge status: Home / Self Care | Payer: PPO | Source: Ambulatory Visit | Attending: Orthopedic Surgery | Admitting: Orthopedic Surgery

## 2017-11-26 DIAGNOSIS — Z780 Asymptomatic menopausal state: Secondary | ICD-10-CM | POA: Diagnosis not present

## 2017-11-26 DIAGNOSIS — Z809 Family history of malignant neoplasm, unspecified: Secondary | ICD-10-CM | POA: Diagnosis not present

## 2017-11-26 DIAGNOSIS — Z818 Family history of other mental and behavioral disorders: Secondary | ICD-10-CM | POA: Diagnosis not present

## 2017-11-26 DIAGNOSIS — F329 Major depressive disorder, single episode, unspecified: Secondary | ICD-10-CM | POA: Diagnosis present

## 2017-11-26 DIAGNOSIS — E785 Hyperlipidemia, unspecified: Secondary | ICD-10-CM | POA: Diagnosis not present

## 2017-11-26 DIAGNOSIS — I7300 Raynaud's syndrome without gangrene: Secondary | ICD-10-CM | POA: Diagnosis not present

## 2017-11-26 DIAGNOSIS — I10 Essential (primary) hypertension: Secondary | ICD-10-CM | POA: Diagnosis not present

## 2017-11-26 DIAGNOSIS — M479 Spondylosis, unspecified: Secondary | ICD-10-CM | POA: Diagnosis present

## 2017-11-26 DIAGNOSIS — T84018A Broken internal joint prosthesis, other site, initial encounter: Secondary | ICD-10-CM

## 2017-11-26 DIAGNOSIS — N393 Stress incontinence (female) (male): Secondary | ICD-10-CM | POA: Diagnosis present

## 2017-11-26 DIAGNOSIS — Z96651 Presence of right artificial knee joint: Secondary | ICD-10-CM | POA: Diagnosis not present

## 2017-11-26 DIAGNOSIS — K219 Gastro-esophageal reflux disease without esophagitis: Secondary | ICD-10-CM | POA: Diagnosis not present

## 2017-11-26 DIAGNOSIS — K449 Diaphragmatic hernia without obstruction or gangrene: Secondary | ICD-10-CM | POA: Diagnosis present

## 2017-11-26 DIAGNOSIS — Z96652 Presence of left artificial knee joint: Secondary | ICD-10-CM | POA: Diagnosis present

## 2017-11-26 DIAGNOSIS — Z8249 Family history of ischemic heart disease and other diseases of the circulatory system: Secondary | ICD-10-CM | POA: Diagnosis not present

## 2017-11-26 DIAGNOSIS — F419 Anxiety disorder, unspecified: Secondary | ICD-10-CM | POA: Diagnosis present

## 2017-11-26 DIAGNOSIS — M8580 Other specified disorders of bone density and structure, unspecified site: Secondary | ICD-10-CM | POA: Diagnosis present

## 2017-11-26 DIAGNOSIS — M419 Scoliosis, unspecified: Secondary | ICD-10-CM | POA: Diagnosis not present

## 2017-11-26 DIAGNOSIS — Z823 Family history of stroke: Secondary | ICD-10-CM

## 2017-11-26 DIAGNOSIS — Z91038 Other insect allergy status: Secondary | ICD-10-CM

## 2017-11-26 DIAGNOSIS — E739 Lactose intolerance, unspecified: Secondary | ICD-10-CM | POA: Diagnosis not present

## 2017-11-26 DIAGNOSIS — G8918 Other acute postprocedural pain: Secondary | ICD-10-CM | POA: Diagnosis not present

## 2017-11-26 DIAGNOSIS — E7800 Pure hypercholesterolemia, unspecified: Secondary | ICD-10-CM | POA: Diagnosis not present

## 2017-11-26 DIAGNOSIS — M25561 Pain in right knee: Secondary | ICD-10-CM | POA: Diagnosis present

## 2017-11-26 DIAGNOSIS — T84022A Instability of internal right knee prosthesis, initial encounter: Principal | ICD-10-CM | POA: Diagnosis present

## 2017-11-26 DIAGNOSIS — J45909 Unspecified asthma, uncomplicated: Secondary | ICD-10-CM | POA: Diagnosis not present

## 2017-11-26 DIAGNOSIS — Z885 Allergy status to narcotic agent status: Secondary | ICD-10-CM

## 2017-11-26 DIAGNOSIS — Z96659 Presence of unspecified artificial knee joint: Secondary | ICD-10-CM

## 2017-11-26 DIAGNOSIS — Z888 Allergy status to other drugs, medicaments and biological substances status: Secondary | ICD-10-CM

## 2017-11-26 HISTORY — PX: TOTAL KNEE REVISION: SHX996

## 2017-11-26 LAB — TYPE AND SCREEN
ABO/RH(D): AB POS
ANTIBODY SCREEN: NEGATIVE

## 2017-11-26 SURGERY — TOTAL KNEE REVISION
Anesthesia: General | Site: Knee | Laterality: Right

## 2017-11-26 MED ORDER — ROCURONIUM BROMIDE 10 MG/ML (PF) SYRINGE
PREFILLED_SYRINGE | INTRAVENOUS | Status: AC
  Filled 2017-11-26: qty 5

## 2017-11-26 MED ORDER — SCOPOLAMINE 1 MG/3DAYS TD PT72
MEDICATED_PATCH | TRANSDERMAL | Status: DC | PRN
  Administered 2017-11-26: 1 via TRANSDERMAL

## 2017-11-26 MED ORDER — BUPIVACAINE LIPOSOME 1.3 % IJ SUSP
INTRAMUSCULAR | Status: DC | PRN
  Administered 2017-11-26: 20 mL

## 2017-11-26 MED ORDER — ONDANSETRON HCL 4 MG/2ML IJ SOLN
INTRAMUSCULAR | Status: DC | PRN
  Administered 2017-11-26: 4 mg via INTRAVENOUS

## 2017-11-26 MED ORDER — PHENOL 1.4 % MT LIQD: 1.0000 | OROMUCOSAL | Status: DC | PRN

## 2017-11-26 MED ORDER — PROPOFOL 10 MG/ML IV BOLUS
INTRAVENOUS | Status: AC
  Filled 2017-11-26: qty 20

## 2017-11-26 MED ORDER — PANTOPRAZOLE SODIUM 40 MG PO TBEC
80.0000 mg | DELAYED_RELEASE_TABLET | Freq: Every day | ORAL | Status: DC
  Administered 2017-11-26 – 2017-11-27 (×2): 80 mg via ORAL
  Filled 2017-11-26 (×2): qty 2

## 2017-11-26 MED ORDER — SCOPOLAMINE 1 MG/3DAYS TD PT72
MEDICATED_PATCH | TRANSDERMAL | Status: AC
  Filled 2017-11-26: qty 1

## 2017-11-26 MED ORDER — SODIUM CHLORIDE 0.9 % IJ SOLN
INTRAMUSCULAR | Status: AC
  Filled 2017-11-26: qty 10

## 2017-11-26 MED ORDER — SODIUM CHLORIDE 0.9 % IV SOLN: INTRAVENOUS | Status: DC

## 2017-11-26 MED ORDER — DOCUSATE SODIUM 100 MG PO CAPS
100.0000 mg | ORAL_CAPSULE | Freq: Two times a day (BID) | ORAL | Status: DC
  Administered 2017-11-26 – 2017-11-27 (×2): 100 mg via ORAL
  Filled 2017-11-26: qty 1

## 2017-11-26 MED ORDER — METOCLOPRAMIDE HCL 5 MG/ML IJ SOLN: 5.0000 mg | Freq: Three times a day (TID) | INTRAMUSCULAR | Status: DC | PRN

## 2017-11-26 MED ORDER — ASPIRIN 325 MG PO TBEC: 325.0000 mg | DELAYED_RELEASE_TABLET | Freq: Every day | ORAL | 0 refills | Status: DC

## 2017-11-26 MED ORDER — OXYCODONE HCL 5 MG/5ML PO SOLN
5.0000 mg | ORAL | Status: DC | PRN
  Administered 2017-11-26: 10 mg via ORAL
  Administered 2017-11-26 (×2): 5 mg via ORAL
  Administered 2017-11-26 – 2017-11-27 (×4): 10 mg via ORAL
  Filled 2017-11-26 (×2): qty 10
  Filled 2017-11-26: qty 5
  Filled 2017-11-26 (×2): qty 10
  Filled 2017-11-26: qty 5
  Filled 2017-11-26: qty 10

## 2017-11-26 MED ORDER — GABAPENTIN 300 MG PO CAPS
ORAL_CAPSULE | ORAL | Status: AC
  Filled 2017-11-26: qty 1

## 2017-11-26 MED ORDER — HYDROCHLOROTHIAZIDE 12.5 MG PO CAPS
12.5000 mg | ORAL_CAPSULE | Freq: Every morning | ORAL | Status: DC
  Administered 2017-11-27: 12.5 mg via ORAL
  Filled 2017-11-26: qty 1

## 2017-11-26 MED ORDER — TRAMADOL HCL 50 MG PO TABS: 50.0000 mg | ORAL_TABLET | Freq: Four times a day (QID) | ORAL | Status: DC | PRN

## 2017-11-26 MED ORDER — ONDANSETRON HCL 4 MG PO TABS: 4.0000 mg | ORAL_TABLET | Freq: Four times a day (QID) | ORAL | Status: DC | PRN

## 2017-11-26 MED ORDER — MIDAZOLAM HCL 2 MG/2ML IJ SOLN
INTRAMUSCULAR | Status: AC
  Filled 2017-11-26: qty 2

## 2017-11-26 MED ORDER — MOMETASONE FURO-FORMOTEROL FUM 200-5 MCG/ACT IN AERO
2.0000 | INHALATION_SPRAY | Freq: Two times a day (BID) | RESPIRATORY_TRACT | Status: DC
  Administered 2017-11-26 – 2017-11-27 (×2): 2 via RESPIRATORY_TRACT
  Filled 2017-11-26: qty 8.8

## 2017-11-26 MED ORDER — MIDAZOLAM HCL 2 MG/2ML IJ SOLN
1.0000 mg | INTRAMUSCULAR | Status: DC
  Administered 2017-11-26 (×2): 1 mg via INTRAVENOUS

## 2017-11-26 MED ORDER — GABAPENTIN 100 MG PO CAPS
200.0000 mg | ORAL_CAPSULE | Freq: Every day | ORAL | Status: DC
  Administered 2017-11-26: 200 mg via ORAL
  Filled 2017-11-26: qty 2

## 2017-11-26 MED ORDER — FENTANYL CITRATE (PF) 100 MCG/2ML IJ SOLN
25.0000 ug | INTRAMUSCULAR | Status: DC | PRN
  Administered 2017-11-26 (×2): 50 ug via INTRAVENOUS

## 2017-11-26 MED ORDER — DIPHENHYDRAMINE HCL 12.5 MG/5ML PO ELIX: 12.5000 mg | ORAL_SOLUTION | ORAL | Status: DC | PRN

## 2017-11-26 MED ORDER — FENTANYL CITRATE (PF) 100 MCG/2ML IJ SOLN
INTRAMUSCULAR | Status: AC
  Filled 2017-11-26: qty 2

## 2017-11-26 MED ORDER — PROPOFOL 10 MG/ML IV BOLUS
INTRAVENOUS | Status: DC | PRN
  Administered 2017-11-26: 150 mg via INTRAVENOUS

## 2017-11-26 MED ORDER — LACTATED RINGERS IV SOLN: INTRAVENOUS | Status: DC

## 2017-11-26 MED ORDER — MEPERIDINE HCL 50 MG/ML IJ SOLN: 6.2500 mg | INTRAMUSCULAR | Status: DC | PRN

## 2017-11-26 MED ORDER — SODIUM CHLORIDE 0.9 % IJ SOLN
INTRAMUSCULAR | Status: AC
  Filled 2017-11-26: qty 50

## 2017-11-26 MED ORDER — DEXAMETHASONE SODIUM PHOSPHATE 10 MG/ML IJ SOLN
INTRAMUSCULAR | Status: DC | PRN
  Administered 2017-11-26: 10 mg via INTRAVENOUS

## 2017-11-26 MED ORDER — POLYETHYLENE GLYCOL 3350 17 G PO PACK
17.0000 g | PACK | Freq: Every day | ORAL | Status: DC | PRN
  Filled 2017-11-26: qty 1

## 2017-11-26 MED ORDER — ONDANSETRON HCL 4 MG/2ML IJ SOLN
INTRAMUSCULAR | Status: AC
  Filled 2017-11-26: qty 2

## 2017-11-26 MED ORDER — SUCCINYLCHOLINE CHLORIDE 200 MG/10ML IV SOSY
PREFILLED_SYRINGE | INTRAVENOUS | Status: DC | PRN
  Administered 2017-11-26: 100 mg via INTRAVENOUS

## 2017-11-26 MED ORDER — FENTANYL CITRATE (PF) 100 MCG/2ML IJ SOLN
50.0000 ug | INTRAMUSCULAR | Status: DC
  Administered 2017-11-26 (×2): 50 ug via INTRAVENOUS

## 2017-11-26 MED ORDER — BUPROPION HCL ER (XL) 150 MG PO TB24
150.0000 mg | ORAL_TABLET | Freq: Every morning | ORAL | Status: DC
  Administered 2017-11-27: 150 mg via ORAL
  Filled 2017-11-26: qty 1

## 2017-11-26 MED ORDER — ACETAMINOPHEN 10 MG/ML IV SOLN
1000.0000 mg | Freq: Once | INTRAVENOUS | Status: AC
  Administered 2017-11-26: 1000 mg via INTRAVENOUS

## 2017-11-26 MED ORDER — METHOCARBAMOL 1000 MG/10ML IJ SOLN
500.0000 mg | Freq: Four times a day (QID) | INTRAVENOUS | Status: DC | PRN
  Administered 2017-11-26: 500 mg via INTRAVENOUS
  Filled 2017-11-26: qty 550

## 2017-11-26 MED ORDER — PROMETHAZINE HCL 25 MG/ML IJ SOLN: 6.2500 mg | INTRAMUSCULAR | Status: DC | PRN

## 2017-11-26 MED ORDER — GABAPENTIN 100 MG PO CAPS: 200.0000 mg | ORAL_CAPSULE | Freq: Every day | ORAL | Status: DC

## 2017-11-26 MED ORDER — CEFAZOLIN SODIUM-DEXTROSE 2-4 GM/100ML-% IV SOLN
2.0000 g | INTRAVENOUS | Status: AC
  Administered 2017-11-26: 2 g via INTRAVENOUS
  Filled 2017-11-26: qty 100

## 2017-11-26 MED ORDER — CHLORHEXIDINE GLUCONATE 4 % EX LIQD: 60.0000 mL | Freq: Once | CUTANEOUS | Status: DC

## 2017-11-26 MED ORDER — SUCCINYLCHOLINE CHLORIDE 200 MG/10ML IV SOSY
PREFILLED_SYRINGE | INTRAVENOUS | Status: AC
  Filled 2017-11-26: qty 10

## 2017-11-26 MED ORDER — ROPIVACAINE HCL 7.5 MG/ML IJ SOLN
INTRAMUSCULAR | Status: DC | PRN
  Administered 2017-11-26: 30 mL via PERINEURAL

## 2017-11-26 MED ORDER — METHOCARBAMOL 500 MG PO TABS: 500.0000 mg | ORAL_TABLET | Freq: Four times a day (QID) | ORAL | 0 refills | Status: DC | PRN

## 2017-11-26 MED ORDER — DEXAMETHASONE SODIUM PHOSPHATE 10 MG/ML IJ SOLN
10.0000 mg | Freq: Once | INTRAMUSCULAR | Status: AC
  Administered 2017-11-27: 10 mg via INTRAVENOUS
  Filled 2017-11-26: qty 1

## 2017-11-26 MED ORDER — PROPOFOL 500 MG/50ML IV EMUL
INTRAVENOUS | Status: DC | PRN
  Administered 2017-11-26: 50 ug/kg/min via INTRAVENOUS

## 2017-11-26 MED ORDER — SODIUM CHLORIDE 0.9 % IV BOLUS
250.0000 mL | Freq: Once | INTRAVENOUS | Status: AC
  Administered 2017-11-26: 250 mL via INTRAVENOUS

## 2017-11-26 MED ORDER — PROPOFOL 10 MG/ML IV BOLUS
INTRAVENOUS | Status: AC
  Filled 2017-11-26: qty 40

## 2017-11-26 MED ORDER — ACETAMINOPHEN 500 MG PO TABS
1000.0000 mg | ORAL_TABLET | Freq: Four times a day (QID) | ORAL | Status: AC
  Administered 2017-11-26 – 2017-11-27 (×4): 1000 mg via ORAL
  Filled 2017-11-26 (×3): qty 2

## 2017-11-26 MED ORDER — ASPIRIN EC 325 MG PO TBEC
325.0000 mg | DELAYED_RELEASE_TABLET | Freq: Every day | ORAL | Status: DC
  Administered 2017-11-27: 325 mg via ORAL
  Filled 2017-11-26: qty 1

## 2017-11-26 MED ORDER — 0.9 % SODIUM CHLORIDE (POUR BTL) OPTIME
TOPICAL | Status: DC | PRN
  Administered 2017-11-26: 1000 mL

## 2017-11-26 MED ORDER — ONDANSETRON HCL 4 MG/2ML IJ SOLN: 4.0000 mg | Freq: Four times a day (QID) | INTRAMUSCULAR | Status: DC | PRN

## 2017-11-26 MED ORDER — BISACODYL 10 MG RE SUPP: 10.0000 mg | Freq: Every day | RECTAL | Status: DC | PRN

## 2017-11-26 MED ORDER — CLONIDINE HCL (ANALGESIA) 100 MCG/ML EP SOLN
EPIDURAL | Status: DC | PRN
  Administered 2017-11-26: 50 ug

## 2017-11-26 MED ORDER — ACETAMINOPHEN 10 MG/ML IV SOLN
INTRAVENOUS | Status: AC
  Filled 2017-11-26: qty 100

## 2017-11-26 MED ORDER — SODIUM CHLORIDE 0.9 % IV SOLN
INTRAVENOUS | Status: DC
  Administered 2017-11-27: 06:00:00 via INTRAVENOUS

## 2017-11-26 MED ORDER — LIDOCAINE 2% (20 MG/ML) 5 ML SYRINGE
INTRAMUSCULAR | Status: DC | PRN
  Administered 2017-11-26: 100 mg via INTRAVENOUS

## 2017-11-26 MED ORDER — PANTOPRAZOLE SODIUM 40 MG PO TBEC: 80.0000 mg | DELAYED_RELEASE_TABLET | Freq: Every day | ORAL | Status: DC

## 2017-11-26 MED ORDER — DEXAMETHASONE SODIUM PHOSPHATE 10 MG/ML IJ SOLN: 10.0000 mg | Freq: Once | INTRAMUSCULAR | Status: DC

## 2017-11-26 MED ORDER — MORPHINE SULFATE (PF) 2 MG/ML IV SOLN
INTRAVENOUS | Status: AC
  Filled 2017-11-26: qty 1

## 2017-11-26 MED ORDER — SODIUM CHLORIDE 0.9 % IR SOLN
Status: DC | PRN
  Administered 2017-11-26: 1000 mL

## 2017-11-26 MED ORDER — FLEET ENEMA 7-19 GM/118ML RE ENEM: 1.0000 | ENEMA | Freq: Once | RECTAL | Status: DC | PRN

## 2017-11-26 MED ORDER — GABAPENTIN 300 MG PO CAPS: 300.0000 mg | ORAL_CAPSULE | Freq: Once | ORAL | Status: DC

## 2017-11-26 MED ORDER — CEFAZOLIN SODIUM-DEXTROSE 1-4 GM/50ML-% IV SOLN
1.0000 g | Freq: Four times a day (QID) | INTRAVENOUS | Status: AC
  Administered 2017-11-26 (×2): 1 g via INTRAVENOUS
  Filled 2017-11-26 (×2): qty 50

## 2017-11-26 MED ORDER — SODIUM CHLORIDE 0.9 % IJ SOLN
INTRAMUSCULAR | Status: DC | PRN
  Administered 2017-11-26: 60 mL

## 2017-11-26 MED ORDER — METHOCARBAMOL 500 MG PO TABS
500.0000 mg | ORAL_TABLET | Freq: Four times a day (QID) | ORAL | Status: DC | PRN
  Administered 2017-11-26 – 2017-11-27 (×2): 500 mg via ORAL
  Filled 2017-11-26 (×2): qty 1

## 2017-11-26 MED ORDER — GABAPENTIN 300 MG PO CAPS: 300.0000 mg | ORAL_CAPSULE | Freq: Every day | ORAL | Status: DC

## 2017-11-26 MED ORDER — DEXAMETHASONE SODIUM PHOSPHATE 10 MG/ML IJ SOLN
INTRAMUSCULAR | Status: AC
  Filled 2017-11-26: qty 1

## 2017-11-26 MED ORDER — MENTHOL 3 MG MT LOZG: 1.0000 | LOZENGE | OROMUCOSAL | Status: DC | PRN

## 2017-11-26 MED ORDER — METOCLOPRAMIDE HCL 5 MG PO TABS: 5.0000 mg | ORAL_TABLET | Freq: Three times a day (TID) | ORAL | Status: DC | PRN

## 2017-11-26 MED ORDER — MORPHINE SULFATE (PF) 2 MG/ML IV SOLN
1.0000 mg | INTRAVENOUS | Status: DC | PRN
  Administered 2017-11-26 (×2): 1 mg via INTRAVENOUS
  Filled 2017-11-26: qty 1

## 2017-11-26 MED ORDER — LACTATED RINGERS IV SOLN
INTRAVENOUS | Status: DC
  Administered 2017-11-26 (×2): via INTRAVENOUS

## 2017-11-26 MED ORDER — LIDOCAINE 2% (20 MG/ML) 5 ML SYRINGE
INTRAMUSCULAR | Status: AC
  Filled 2017-11-26: qty 5

## 2017-11-26 SURGICAL SUPPLY — 54 items
BAG DECANTER FOR FLEXI CONT (MISCELLANEOUS) ×2 IMPLANT
BAG SPEC THK2 15X12 ZIP CLS (MISCELLANEOUS) ×1
BAG ZIPLOCK 12X15 (MISCELLANEOUS) ×1 IMPLANT
BANDAGE ACE 6X5 VEL STRL LF (GAUZE/BANDAGES/DRESSINGS) ×2 IMPLANT
BLADE SAG 18X100X1.27 (BLADE) ×2 IMPLANT
BLADE SAW SGTL 11.0X1.19X90.0M (BLADE) ×2 IMPLANT
CLOTH BEACON ORANGE TIMEOUT ST (SAFETY) ×2 IMPLANT
COVER SURGICAL LIGHT HANDLE (MISCELLANEOUS) ×2 IMPLANT
CUFF TOURN SGL QUICK 34 (TOURNIQUET CUFF) ×2
CUFF TRNQT CYL 34X4X40X1 (TOURNIQUET CUFF) ×1 IMPLANT
DECANTER SPIKE VIAL GLASS SM (MISCELLANEOUS) ×1 IMPLANT
DRAPE U-SHAPE 47X51 STRL (DRAPES) ×2 IMPLANT
DRSG ADAPTIC 3X8 NADH LF (GAUZE/BANDAGES/DRESSINGS) ×2 IMPLANT
DURAPREP 26ML APPLICATOR (WOUND CARE) ×2 IMPLANT
ELECT REM PT RETURN 15FT ADLT (MISCELLANEOUS) ×2 IMPLANT
EVACUATOR 1/8 PVC DRAIN (DRAIN) ×2 IMPLANT
GAUZE SPONGE 4X4 12PLY STRL (GAUZE/BANDAGES/DRESSINGS) ×2 IMPLANT
GLOVE BIO SURGEON STRL SZ8 (GLOVE) ×2 IMPLANT
GLOVE BIOGEL PI IND STRL 6.5 (GLOVE) IMPLANT
GLOVE BIOGEL PI IND STRL 7.5 (GLOVE) IMPLANT
GLOVE BIOGEL PI IND STRL 8 (GLOVE) ×1 IMPLANT
GLOVE BIOGEL PI INDICATOR 6.5 (GLOVE) ×1
GLOVE BIOGEL PI INDICATOR 7.5 (GLOVE) ×3
GLOVE BIOGEL PI INDICATOR 8 (GLOVE) ×2
GLOVE ECLIPSE 7.5 STRL STRAW (GLOVE) ×1 IMPLANT
GLOVE SURG SS PI 6.5 STRL IVOR (GLOVE) ×1 IMPLANT
GLOVE SURG SS PI 7.5 STRL IVOR (GLOVE) ×1 IMPLANT
GLOVE SURG SS PI 8.0 STRL IVOR (GLOVE) ×1 IMPLANT
GOWN STRL REUS W/ TWL XL LVL3 (GOWN DISPOSABLE) IMPLANT
GOWN STRL REUS W/TWL 2XL LVL3 (GOWN DISPOSABLE) ×2 IMPLANT
GOWN STRL REUS W/TWL LRG LVL3 (GOWN DISPOSABLE) ×3 IMPLANT
GOWN STRL REUS W/TWL XL LVL3 (GOWN DISPOSABLE) ×2
HANDPIECE INTERPULSE COAX TIP (DISPOSABLE) ×2
HOLDER FOLEY CATH W/STRAP (MISCELLANEOUS) ×1 IMPLANT
IMMOBILIZER KNEE 20 (SOFTGOODS) ×2
IMMOBILIZER KNEE 20 THIGH 36 (SOFTGOODS) ×1 IMPLANT
INSERT PFC SIG STB SZ 2.5 20.0 (Knees) ×1 IMPLANT
MANIFOLD NEPTUNE II (INSTRUMENTS) ×2 IMPLANT
PACK TOTAL KNEE CUSTOM (KITS) ×2 IMPLANT
PAD ABD 8X10 STRL (GAUZE/BANDAGES/DRESSINGS) ×1 IMPLANT
PADDING CAST COTTON 6X4 STRL (CAST SUPPLIES) ×5 IMPLANT
POSITIONER SURGICAL ARM (MISCELLANEOUS) ×2 IMPLANT
SET HNDPC FAN SPRY TIP SCT (DISPOSABLE) ×1 IMPLANT
STRIP CLOSURE SKIN 1/2X4 (GAUZE/BANDAGES/DRESSINGS) ×2 IMPLANT
SUT MNCRL AB 4-0 PS2 18 (SUTURE) ×1 IMPLANT
SUT STRATAFIX 0 PDS 27 VIOLET (SUTURE) ×2
SUT VIC AB 2-0 CT1 27 (SUTURE) ×4
SUT VIC AB 2-0 CT1 TAPERPNT 27 (SUTURE) ×3 IMPLANT
SUTURE STRATFX 0 PDS 27 VIOLET (SUTURE) ×1 IMPLANT
SYR 3ML LL SCALE MARK (SYRINGE) IMPLANT
SYR 50ML LL SCALE MARK (SYRINGE) ×4 IMPLANT
TOWER CARTRIDGE SMART MIX (DISPOSABLE) ×2 IMPLANT
TRAY FOLEY CATH 14FR (SET/KITS/TRAYS/PACK) ×1 IMPLANT
WRAP KNEE MAXI GEL POST OP (GAUZE/BANDAGES/DRESSINGS) ×2 IMPLANT

## 2017-11-26 NOTE — Op Note (Signed)
NAME: ATLEY, SCARBORO MEDICAL RECORD QP:[id number] ACCOUNT [id number] DATE OF BIRTH:Jul 08, 1951 FACILITY: [facility] LOCATION: [facility] PHYSICIAN:Nevea Spiewak Zella Ball, MD  OPERATIVE REPORT  DATE OF PROCEDURE:  11/26/2017  PREOPERATIVE DIAGNOSIS:  Unstable right total knee arthroplasty.  POSTOPERATIVE DIAGNOSIS:  Unstable right total knee arthroplasty.  PROCEDURE:  Right knee polyethylene revision.  SURGEON:  Gaynelle Arabian, MD  ASSISTANT:  Ardeen Jourdain PA-C.  ANESTHESIA:  Spinal.  ESTIMATED BLOOD LOSS:  Minimal.  DRAIN:  Hemovac x1.    TOURNIQUET TIME:  19 minutes at 300 mmHg.  COMPLICATIONS:  None.  CONDITION:  Stable to recovery.  BRIEF CLINICAL NOTE:  The patient is a 67 year old female who had a total knee arthroplasty done several years ago.  She did well and recently has noticed increased pain and instability in the knee.  On exam, she was noted to have a fair amount of varus  valgus as well as anterior, posterior laxity in the knee.  Her components all appear to be well fixed in good position.  She did not respond to physical therapy.  She presents now for a polyethylene versus total knee arthroplasty revision.  DESCRIPTION OF PROCEDURE:  After successful administration of spinal anesthetic.  A tourniquet was placed high on the right thigh and then exam under anesthesia performed.  She has a significant amount of varus valgus laxity in extension and actually  hyperextends about 5 degrees.  In 90 degrees of flexion, she had significant anterior, or posterior laxity.  The right lower extremity was then prepped and draped in the usual sterile fashion.  Extremity was wrapped in Esmarch and tourniquet inflated to  300 mmHg.  Previous incisions were utilized.  Skin cut with a 10 blade through the subcutaneous tissue to the level of the extensor mechanism.  A fresh blade was used to make a medial parapatellar arthrotomy.  We did not encounter fluid in the joint.   There is some wear  debris present.  The soft tissue of the proximal medial tibia subperiosteally elevate to the joint line with a knife and into the semimembranosus bursa with a Cobb elevator.  Soft tissue laterally was elevated with attention being paid  to avoid any patellar tendon on the tibial tubercle.  Patella was everted and the knee flexed 90 degrees.  I was able to sublux the tibia forward in front of the femur.  I removed the tibial polyethylene.  It was 12.5 mm thick.  The post had significant  wear from her hyperextension.  The remainder of the polyethylene did not have any significant wear.  We did a synovectomy removing the polyethylene debris.  I inspected the junction of the patellar component, femoral component and tibial component with  bone and there was no evidence of loosening of any of these components.  The patellar component showed no wear.  I felt that the components were stable, so we should be able to treat this with a polyethylene revision.  I first trialed a 17.5 mm thickness  polyethylene insert and with the 17.5, full extension was achieved with a tiny bit of varus valgus play and with a tiny bit of AP play at 90 degrees of flexion.  We went to a 20 mm trial, which allowed for full extension with excellent varus valgus and  anterior/posterior balance throughout full range of motion.  The trial was removed and the permanent 20 mm posterior stabilized rotating platform insert is placed into the tibial tray.  This was for a Sigma femur.  It  was a 2.5 mm size with 20 mm  thickness.  The polyethylene was placed and the knee reduced.  With outstanding stability throughout full range of motion.  The wound was copiously irrigated with saline solution and the synovectomy completed.  Twenty mL of Exparel mixed with 60 mL of  saline was injected into the extensor mechanism, posterior tissues and then subcutaneous tissues also.  The arthrotomy was then closed over a Hemovac drain with a running  #1 V-Loc  suture.  Flexion against gravity was 135 degrees.  Tourniquet was  released.  Total time of 19 minutes.  Subcutaneous was closed with interrupted 2-0 Vicryl and subcuticular running 4-0 Monocryl.  The incision was cleaned and dried and Steri-Strips and a bulky sterile dressing are applied.  She was then placed into a  knee immobilizer, awakened and transported to recovery in stable condition.  AN/NUANCE  D:11/26/2017 T:11/26/2017 JOB:[id number]

## 2017-11-26 NOTE — Progress Notes (Signed)
Assisted Dr. Germeroth with right, ultrasound guided, adductor canal block. Side rails up, monitors on throughout procedure. See vital signs in flow sheet. Tolerated Procedure well. 

## 2017-11-26 NOTE — Anesthesia Postprocedure Evaluation (Addendum)
Anesthesia Post Note  Patient: Beth Peters  Procedure(s) Performed: Right knee polyethylene revision (Right Knee)     Patient location during evaluation: PACU Anesthesia Type: General Level of consciousness: awake and alert Pain management: pain level controlled Vital Signs Assessment: post-procedure vital signs reviewed and stable Respiratory status: spontaneous breathing and respiratory function stable Cardiovascular status: blood pressure returned to baseline and stable Anesthetic complications: no    Last Vitals:  Vitals:   11/26/17 1619 11/26/17 1753  BP: (!) 148/84 (!) 149/79  Pulse: 62 64  Resp: 16 16  Temp: 36.4 C (!) 36.3 C  SpO2: 100% 98%    Last Pain:  Vitals:   11/26/17 1900  TempSrc:   PainSc: [place]

## 2017-11-26 NOTE — Brief Op Note (Signed)
11/26/2017  10:59 AM  PATIENT:  Beth Peters  67 y.o. female  PRE-OPERATIVE DIAGNOSIS:  Unstable Right total knee arthroplasty  POST-OPERATIVE DIAGNOSIS:  Unstable Right total knee arthroplasty  PROCEDURE:  Procedure(s) with comments: Right knee polyethylene revision (Right) - Adductor Block  SURGEON:  Surgeon(s) and Role:    Gaynelle Arabian, MD - Primary  PHYSICIAN ASSISTANT:   ASSISTANTS: Ardeen Jourdain, PA-C   ANESTHESIA:   spinal  EBL:  30 mL   BLOOD ADMINISTERED:none  DRAINS: (right knee) Hemovact drain(s) in the right knee with  Suction Open   LOCAL MEDICATIONS USED:  OTHER Exparel  COUNTS:  YES  TOURNIQUET:   Total Tourniquet Time Documented: Thigh (Right) - 19 minutes Total: Thigh (Right) - 19 minutes   DICTATION: .Other Dictation: Dictation Number [id number]  PLAN OF CARE: Admit to inpatient   PATIENT DISPOSITION:  PACU - hemodynamically stable.

## 2017-11-26 NOTE — Evaluation (Signed)
Physical Therapy Evaluation Patient Details Name: Beth Peters MRN: [id number] DOB: 08-07-1950 Today's Date: 11/26/2017   History of Present Illness  Pt s/p R TKR liner revision.  Pt with hx of Bil TKR  Clinical Impression  Pt s/p R TKR revision and presents with decreased R LE strength/ROM and post op pain limiting functional mobility.  Pt should progress to dc home with family assist.    Follow Up Recommendations Follow surgeon's recommendation for DC plan and follow-up therapies    Equipment Recommendations  None recommended by PT    Recommendations for Other Services       Precautions / Restrictions Precautions Precautions: Knee;Fall Required Braces or Orthoses: Knee Immobilizer - Right Knee Immobilizer - Right: Discontinue once straight leg raise with < 10 degree lag Restrictions Weight Bearing Restrictions: No Other Position/Activity Restrictions: WBAT      Mobility  Bed Mobility Overal bed mobility: Needs Assistance Bed Mobility: Supine to Sit;Sit to Supine     Supine to sit: Min assist Sit to supine: Min assist   General bed mobility comments: cues for sequence and use of L LE to self assist  Transfers Overall transfer level: Needs assistance Equipment used: Rolling walker (2 wheeled) Transfers: Sit to/from Stand Sit to Stand: Min assist         General transfer comment: cues fo LE management and use of UEs to self assist  Ambulation/Gait Ambulation/Gait assistance: Min assist Ambulation Distance (Feet): 3 Feet Assistive device: Rolling walker (2 wheeled) Gait Pattern/deviations: Step-to pattern;Decreased step length - right;Decreased step length - left;Shuffle Gait velocity: decr   General Gait Details: Pt side stepped up side of bed only - ltd by increasing dizziness  Stairs            Wheelchair Mobility    Modified Rankin (Stroke Patients Only)       Balance                                              Pertinent Vitals/Pain Pain Assessment: 0-10 Pain Score: 5  Pain Location: R knee Pain Descriptors / Indicators: Aching;Sore Pain Intervention(s): Limited activity within patient's tolerance;Monitored during session;Premedicated before session;Ice applied    Home Living Family/patient expects to be discharged to:: Private residence Living Arrangements: Spouse/significant other Available Help at Discharge: Family Type of Home: House Home Access: Stairs to enter Entrance Stairs-Rails: None Entrance Stairs-Number of Steps: 2 Home Layout: Able to live on main level with bedroom/bathroom Home Equipment: Walker - 2 wheels;Cane - single point;Bedside commode      Prior Function Level of Independence: Independent               Hand Dominance        Extremity/Trunk Assessment   Upper Extremity Assessment Upper Extremity Assessment: Overall WFL for tasks assessed    Lower Extremity Assessment Lower Extremity Assessment: RLE deficits/detail    Cervical / Trunk Assessment Cervical / Trunk Assessment: Normal  Communication   Communication: No difficulties  Cognition Arousal/Alertness: Awake/alert Behavior During Therapy: WFL for tasks assessed/performed Overall Cognitive Status: Within Functional Limits for tasks assessed                                        General Comments      Exercises  Assessment/Plan    PT Assessment Patient needs continued PT services  PT Problem List Decreased strength;Decreased range of motion;Decreased activity tolerance;Decreased mobility;Decreased knowledge of use of DME;Pain       PT Treatment Interventions DME instruction;Gait training;Stair training;Functional mobility training;Therapeutic activities;Therapeutic exercise;Patient/family education    PT Goals (Current goals can be found in the Care Plan section)  Acute Rehab PT Goals Patient Stated Goal: Regain IND  PT Goal Formulation: With  patient Time For Goal Achievement: 12/03/17 Potential to Achieve Goals: Good    Frequency 7X/week   Barriers to discharge        Co-evaluation               AM-PAC PT "6 Clicks" Daily Activity  Outcome Measure Difficulty turning over in bed (including adjusting bedclothes, sheets and blankets)?: Unable Difficulty moving from lying on back to sitting on the side of the bed? : Unable Difficulty sitting down on and standing up from a chair with arms (e.g., wheelchair, bedside commode, etc,.)?: Unable Help needed moving to and from a bed to chair (including a wheelchair)?: A Little Help needed walking in hospital room?: A Lot Help needed climbing 3-5 steps with a railing? : A Lot 6 Click Score: 10    End of Session Equipment Utilized During Treatment: Gait belt;Right knee immobilizer Activity Tolerance: Patient limited by fatigue;Other (comment)(dizziness) Patient left: in bed;with call bell/phone within reach;with family/visitor present Nurse Communication: Mobility status PT Visit Diagnosis: Difficulty in walking, not elsewhere classified (R26.2)    Time: 2130-8657 PT Time Calculation (min) (ACUTE ONLY): 24 min   Charges:   PT Evaluation $PT Eval Low Complexity: 1 Low PT Treatments $Therapeutic Activity: 8-22 mins   PT G Codes:        Pg [phone]   Baili Stang 11/26/2017, 5:31 PM

## 2017-11-26 NOTE — Anesthesia Procedure Notes (Signed)
Procedure Name: Intubation Date/Time: 11/26/2017 10:13 AM Performed by: Lind Covert, CRNA Pre-anesthesia Checklist: Patient identified, Suction available and Patient being monitored Patient Re-evaluated:Patient Re-evaluated prior to induction Oxygen Delivery Method: Circle system utilized Preoxygenation: Pre-oxygenation with 100% oxygen Induction Type: IV induction, Rapid sequence and Cricoid Pressure applied Laryngoscope Size: Mac and 3 Tube type: Oral Tube size: 7.0 mm Number of attempts: 2 (able to see base of cords 1st ETT bounced off into esophangus, left in place while DL through cords, 1st ETT removed) Airway Equipment and Method: Stylet Placement Confirmation: ETT inserted through vocal cords under direct vision and positive ETCO2 Secured at: 22 cm Tube secured with: Tape Dental Injury: Teeth and Oropharynx as per pre-operative assessment

## 2017-11-26 NOTE — Plan of Care (Signed)
  Problem: Education: Goal: Knowledge of General Education information will improve Outcome: Progressing   Problem: Activity: Goal: Risk for activity intolerance will decrease Outcome: Progressing   Problem: Pain Managment: Goal: General experience of comfort will improve Outcome: Progressing   

## 2017-11-26 NOTE — Interval H&P Note (Signed)
History and Physical Interval Note:  11/26/2017 8:18 AM  Beth Peters  has presented today for surgery, with the diagnosis of Unstable Right total knee arthroplasty  The various methods of treatment have been discussed with the patient and family. After consideration of risks, benefits and other options for treatment, the patient has consented to  Procedure(s): Right knee polyethylene versus total knee arthroplasty revision (Right) as a surgical intervention .  The patient's history has been reviewed, patient examined, no change in status, stable for surgery.  I have reviewed the patient's chart and labs.  Questions were answered to the patient's satisfaction.     Pilar Plate Kemyra August

## 2017-11-26 NOTE — Anesthesia Procedure Notes (Signed)
Anesthesia Regional Block: Adductor canal block   Pre-Anesthetic Checklist: ,, timeout performed, Correct Patient, Correct Site, Correct Laterality, Correct Procedure, Correct Position, site marked, Risks and benefits discussed,  Surgical consent,  Pre-op evaluation,  At surgeon's request and post-op pain management  Laterality: Right  Prep: chloraprep       Needles:  Injection technique: Single-shot  Needle Type: Stimiplex     Needle Length: 9cm  Needle Gauge: 21     Additional Needles:   Procedures:,,,, ultrasound used (permanent image in chart),,,,  Narrative:  Start time: 11/26/2017 9:32 AM End time: 11/26/2017 9:35 AM Injection made incrementally with aspirations every 5 mL.  Performed by: Personally  Anesthesiologist: Nolon Nations, MD  Additional Notes: BP cuff, EKG monitors applied. Sedation begun. Artery and nerve location verified with U/S and anesthetic injected incrementally, slowly, and after negative aspirations under direct u/s guidance. Good fascial /perineural spread. Tolerated well.

## 2017-11-26 NOTE — Transfer of Care (Signed)
Immediate Anesthesia Transfer of Care Note  Patient: Beth Peters  Procedure(s) Performed: Right knee polyethylene revision (Right Knee)  Patient Location: PACU  Anesthesia Type:GA combined with regional for post-op pain  Level of Consciousness: drowsy and patient cooperative  Airway & Oxygen Therapy: Patient Spontanous Breathing and Patient connected to face mask oxygen  Post-op Assessment: Report given to RN and Post -op Vital signs reviewed and stable  Post vital signs: Reviewed and stable  Last Vitals:  Vitals Value Taken Time  BP    Temp    Pulse    Resp    SpO2      Last Pain:  Vitals:   11/26/17 0938  TempSrc:   PainSc: 0-No pain      Patients Stated Pain Goal: 3 (13/64/38 3779)  Complications: No apparent anesthesia complications

## 2017-11-26 NOTE — Discharge Instructions (Addendum)
Dr. Gaynelle Arabian Total Joint Specialist [facility] [address] [place], [place] [address] [phone]  TOTAL KNEE REPLACEMENT POSTOPERATIVE DIRECTIONS  Knee Rehabilitation, Guidelines Following Surgery  Results after knee surgery are often greatly improved when you follow the exercise, range of motion and muscle strengthening exercises prescribed by your doctor. Safety measures are also important to protect the knee from further injury. Any time any of these exercises cause you to have increased pain or swelling in your knee joint, decrease the amount until you are comfortable again and slowly increase them. If you have problems or questions, call your caregiver or physical therapist for advice.   HOME CARE INSTRUCTIONS  Remove items at home which could result in a fall. This includes throw rugs or furniture in walking pathways.   ICE to the affected knee every three hours for 30 minutes at a time and then as needed for pain and swelling.  Continue to use ice on the knee for pain and swelling from surgery. You may notice swelling that will progress down to the foot and ankle.  This is normal after surgery.  Elevate the leg when you are not up walking on it.    Continue to use the breathing machine which will help keep your temperature down.  It is common for your temperature to cycle up and down following surgery, especially at night when you are not up moving around and exerting yourself.  The breathing machine keeps your lungs expanded and your temperature down.  Do not place pillow under knee, focus on keeping the knee straight while resting  DIET You may resume your previous home diet once your are discharged from the hospital.  DRESSING / WOUND CARE / SHOWERING You may change the dressing every day with sterile gauze.  Please use good hand washing techniques before changing the dressing.  Do not use any lotions or creams on the incision until instructed by your  surgeon. You may start showering once you are discharged home but do not submerge the incision under water. Just pat the incision dry and apply a dry gauze dressing on daily. Change the surgical dressing daily and reapply a dry dressing each time.  ACTIVITY Walk with your walker as instructed. Use walker as long as suggested by your caregivers. Avoid periods of inactivity such as sitting longer than an hour when not asleep. This helps prevent blood clots.  You may resume a sexual relationship in one month or when given the OK by your doctor.  You may return to work once you are cleared by your doctor.  Do not drive a car for 6 weeks or until released by you surgeon.  Do not drive while taking narcotics.  WEIGHT BEARING Weight bearing as tolerated with assist device (walker, cane, etc) as directed, use it as long as suggested by your surgeon or therapist, typically at least 4-6 weeks.  POSTOPERATIVE CONSTIPATION PROTOCOL Constipation - defined medically as fewer than three stools per week and severe constipation as less than one stool per week.  One of the most common issues patients have following surgery is constipation.  Even if you have a regular bowel pattern at home, your normal regimen is likely to be disrupted due to multiple reasons following surgery.  Combination of anesthesia, postoperative narcotics, change in appetite and fluid intake all can affect your bowels.  In order to avoid complications following surgery, here are some recommendations in order to help you during your recovery period.  Colace (docusate) - Pick up an over-the-counter form of Colace or another stool softener and take twice a day as long as you are requiring postoperative pain medications.  Take with a full glass of water daily.  If you experience loose stools or diarrhea, hold the colace until you stool forms back up.  If your symptoms do not get better within 1 week or if they get worse, check with your  doctor. ° °Dulcolax (bisacodyl) - Pick up over-the-counter and take as directed by the product packaging as needed to assist with the movement of your bowels.  Take with a full glass of water.  Use this product as needed if not relieved by Colace only.  ° °MiraLax (polyethylene glycol) - Pick up over-the-counter to have on hand.  MiraLax is a solution that will increase the amount of water in your bowels to assist with bowel movements.  Take as directed and can mix with a glass of water, juice, soda, coffee, or tea.  Take if you go more than two days without a movement. °Do not use MiraLax more than once per day. Call your doctor if you are still constipated or irregular after using this medication for 7 days in a row. ° °If you continue to have problems with postoperative constipation, please contact the office for further assistance and recommendations.  If you experience "the worst abdominal pain ever" or develop nausea or vomiting, please contact the office immediatly for further recommendations for treatment. ° °ITCHING ° If you experience itching with your medications, try taking only a single pain pill, or even half a pain pill at a time.  You can also use Benadryl over the counter for itching or also to help with sleep.  ° °TED HOSE STOCKINGS °Wear the elastic stockings on both legs for three weeks following surgery during the day but you may remove then at night for sleeping. ° °MEDICATIONS °See your medication summary on the “After Visit Summary” that the nursing staff will review with you prior to discharge.  You may have some home medications which will be placed on hold until you complete the course of blood thinner medication.  It is important for you to complete the blood thinner medication as prescribed by your surgeon.  Continue your approved medications as instructed at time of discharge. ° °PRECAUTIONS °If you experience chest pain or shortness of breath - call 911 immediately for transfer to the  hospital emergency department.  °If you develop a fever greater that 101 F, purulent drainage from wound, increased redness or drainage from wound, foul odor from the wound/dressing, or calf pain - CONTACT YOUR SURGEON.   °                                                °FOLLOW-UP APPOINTMENTS °Make sure you keep all of your appointments after your operation with your surgeon and caregivers. You should call the office at the above phone number and make an appointment for approximately two weeks after the date of your surgery or on the date instructed by your surgeon outlined in the "After Visit Summary". ° ° °RANGE OF MOTION AND STRENGTHENING EXERCISES  °Rehabilitation of the knee is important following a knee injury or an operation. After just a few days of immobilization, the muscles of the thigh which control the knee become weakened and   shrink (atrophy). Knee exercises are designed to build up the tone and strength of the thigh muscles and to improve knee motion. Often times heat used for twenty to thirty minutes before working out will loosen up your tissues and help with improving the range of motion but do not use heat for the first two weeks following surgery. These exercises can be done on a training (exercise) mat, on the floor, on a table or on a bed. Use what ever works the best and is most comfortable for you Knee exercises include:  °Leg Lifts - While your knee is still immobilized in a splint or cast, you can do straight leg raises. Lift the leg to 60 degrees, hold for 3 sec, and slowly lower the leg. Repeat 10-20 times 2-3 times daily. Perform this exercise against resistance later as your knee gets better.  °Quad and Hamstring Sets - Tighten up the muscle on the front of the thigh (Quad) and hold for 5-10 sec. Repeat this 10-20 times hourly. Hamstring sets are done by pushing the foot backward against an object and holding for 5-10 sec. Repeat as with quad sets.  °· Leg Slides: Lying on your back,  slowly slide your foot toward your buttocks, bending your knee up off the floor (only go as far as is comfortable). Then slowly slide your foot back down until your leg is flat on the floor again. °· Angel Wings: Lying on your back spread your legs to the side as far apart as you can without causing discomfort.  °A rehabilitation program following serious knee injuries can speed recovery and prevent re-injury in the future due to weakened muscles. Contact your doctor or a physical therapist for more information on knee rehabilitation.  ° °IF YOU ARE TRANSFERRED TO A SKILLED REHAB FACILITY °If the patient is transferred to a skilled rehab facility following release from the hospital, a list of the current medications will be sent to the facility for the patient to continue.  When discharged from the skilled rehab facility, please have the facility set up the patient's Home Health Physical Therapy prior to being released. Also, the skilled facility will be responsible for providing the patient with their medications at time of release from the facility to include their pain medication, the muscle relaxants, and their blood thinner medication. If the patient is still at the rehab facility at time of the two week follow up appointment, the skilled rehab facility will also need to assist the patient in arranging follow up appointment in our office and any transportation needs. ° °MAKE SURE YOU:  °Understand these instructions.  °Get help right away if you are not doing well or get worse.  ° ° °Pick up stool softner and laxative for home use following surgery while on pain medications. °Do not submerge incision under water. °Please use good hand washing techniques while changing dressing each day. °May shower starting three days after surgery. °Please use a clean towel to pat the incision dry following showers. °Continue to use ice for pain and swelling after surgery. °Do not use any lotions or creams on the incision  until instructed by your surgeon. °

## 2017-11-26 NOTE — Telephone Encounter (Signed)
Genetic counseling referral received from Dr. Talbert Nan.  Pt has been scheduled to see Beth Peters on 6/24 at 1pm. Msg sent to the referring office to notify the pt. Letter mailed.

## 2017-11-27 LAB — CBC
HCT: 39.3 % (ref 36.0–46.0)
Hemoglobin: 13 g/dL (ref 12.0–15.0)
MCH: 30.9 pg (ref 26.0–34.0)
MCHC: 33.1 g/dL (ref 30.0–36.0)
MCV: 93.3 fL (ref 78.0–100.0)
Platelets: 184 10*3/uL (ref 150–400)
RBC: 4.21 MIL/uL (ref 3.87–5.11)
RDW: 13.1 % (ref 11.5–15.5)
WBC: 10.5 10*3/uL (ref 4.0–10.5)

## 2017-11-27 LAB — BASIC METABOLIC PANEL
Anion gap: 11 (ref 5–15)
BUN: 7 mg/dL (ref 6–20)
CALCIUM: 9.2 mg/dL (ref 8.9–10.3)
CO2: 24 mmol/L (ref 22–32)
Chloride: 105 mmol/L (ref 101–111)
Creatinine, Ser: 0.77 mg/dL (ref 0.44–1.00)
GFR calc Af Amer: >60 mL/min (ref >60)
GLUCOSE: 124 mg/dL — AB (ref 65–99)
Potassium: 3.4 mmol/L — ABNORMAL LOW (ref 3.5–5.1)
SODIUM: 140 mmol/L (ref 135–145)

## 2017-11-27 MED ORDER — OXYCODONE HCL 5 MG/5ML PO SOLN: 5.0000 mg | ORAL | 0 refills | Status: DC | PRN

## 2017-11-27 MED FILL — METHOCARBAMOL 500 MG TABLET: 500 | 10 days supply | Qty: 40 | Fill #0

## 2017-11-27 MED FILL — oxyCODONE HCL 5 MG/5ML SOLN: 5 | 7 days supply | Qty: 240 | Fill #0

## 2017-11-27 NOTE — Progress Notes (Signed)
Discharge planning, no HH needs identified. Plan for OP PT, has DME. [phone] 

## 2017-11-27 NOTE — Addendum Note (Signed)
Addendum  created 11/27/17 0603 by Victoriano Lain, CRNA   Charge Capture section accepted, Visit diagnoses modified

## 2017-11-27 NOTE — Progress Notes (Addendum)
Subjective: 1 Day Post-Op Procedure(s) (LRB): Right knee polyethylene revision (Right) Patient reports pain as moderate.   Patient seen in rounds with Dr. Wynelle Link. Patient is well, and has had no acute complaints or problems. Voiding and positive flatus. Denies nausea/vomiting. Continue working with therapy today. Plan is to go Home after hospital stay.  Objective: Vital signs in last 24 hours: Temp:  [97.4 F (36.3 C)-98.2 F (36.8 C)] 97.8 F (36.6 C) (05/16 0522) Pulse Rate:  [56-78] 62 (05/16 0522) Resp:  [7-20] 20 (05/16 0522) BP: (121-171)/(67-118) 147/78 (05/16 0522) SpO2:  [95 %-100 %] 99 % (05/16 0522) Weight:  [70.5 kg (155 lb 6 oz)] 70.5 kg (155 lb 6 oz) (05/15 0759)  Intake/Output from previous day:  Intake/Output Summary (Last 24 hours) at 11/27/2017 0737 Last data filed at 11/27/2017 0530 Gross per 24 hour  Intake 3286.32 ml  Output 5895 ml  Net -2608.68 ml    Labs: Recent Labs    11/27/17 0552  HGB 13.0   Recent Labs    11/27/17 0552  WBC 10.5  RBC 4.21  HCT 39.3  PLT 184   Recent Labs    11/27/17 0552  NA 140  K 3.4*  CL 105  CO2 24  BUN 7  CREATININE 0.77  GLUCOSE 124*  CALCIUM 9.2   EXAM General - Patient is Alert, Appropriate and Oriented Extremity - Neurovascular intact Sensation intact distally Intact pulses distally Dorsiflexion/Plantar flexion intact Dressing - dressing C/D/I Motor Function - intact, moving foot and toes well on exam.  Hemovac pulled without difficulty.  Past Medical History:  Diagnosis Date  . Achalasia of esophagus    s/p  heller myotomy hiatal hernia repair 2006  and x3  dilatation's  . Anemia   . Asthma   . Chronic back pain    lumbar and thoracic  . Chronic constipation   . Cubital tunnel syndrome, bilateral   . Degenerative arthritis of spine   . Esophageal dysmotility    ultrasound 11-15-2011  . Family history of adverse reaction to anesthesia    sister and mother-- ponv  .  Gastrointestinal dysmotility   . GERD (gastroesophageal reflux disease)   . Hand tingling    fingers bilateral hand due to bilateral cubital tunnel syndrome  . History of esophageal spasm    s/p  esophageal dilation  . History of hiatal hernia    s/p  repair 2006  . Hypercholesterolemia 2009  . Hypertension   . Lactose intolerance   . 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 OA (osteoarthritis)   . Osteopenia   . Panic disorder   . Polyarthralgia   . PONV (postoperative nausea and vomiting)   . Raynauds syndrome   . Scoliosis of thoracic spine 8/01  . Seasonal affective disorder ([place])   . SUI (stress urinary incontinence, female)   . Superficial thrombophlebitis    x2  left lower leg superficial in  2006  prior to vein stripping     Assessment/Plan: 1 Day Post-Op Procedure(s) (LRB): Right knee polyethylene revision (Right) Principal Problem:   Failed total knee arthroplasty ([place])  Estimated body mass index is 27.52 kg/m as calculated from the following:   Height as of this encounter: 5\' 3"  (1.6 m).   Weight as of this encounter: 70.5 kg (155 lb 6 oz). Advance diet Up with therapy  DVT Prophylaxis - Aspirin Weight-Bearing as tolerated D/C O2 and Pulse OX and try on Room Air   Patient's anticipated LOS is  less than 2 midnights, meeting these requirements: - Younger than 78 - Lives within 1 hour of care - Has a competent adult at home to recover with post-op recover - NO history of  - Chronic pain requiring opiods  - Diabetes  - Coronary Artery Disease  - Heart failure  - Heart attack  - Stroke  - DVT/VTE  - Cardiac arrhythmia  - Respiratory Failure/COPD  - Renal failure  - Anemia  - Advanced Liver disease      Continue working with therapy today, possible discharge this afternoon to home with outpatient therapy at [facility] if meeting goals.  Ardeen Jourdain, PA-C Orthopaedic Surgery 11/27/2017, 7:37 AM

## 2017-11-27 NOTE — Progress Notes (Signed)
Physical Therapy Treatment Patient Details Name: Beth Peters MRN: [id number] DOB: 07/14/1951 Today's Date: 11/27/2017    History of Present Illness Pt s/p R TKR liner revision.  Pt with hx of Bil TKR    PT Comments    Pt progressing with mobility and with no c/o dizziness this am.     Follow Up Recommendations  Follow surgeon's recommendation for DC plan and follow-up therapies     Equipment Recommendations  None recommended by PT    Recommendations for Other Services       Precautions / Restrictions Precautions Precautions: Knee;Fall Required Braces or Orthoses: Knee Immobilizer - Right Knee Immobilizer - Right: Discontinue once straight leg raise with < 10 degree lag(Pt performed IND SLR this am) Restrictions Weight Bearing Restrictions: No Other Position/Activity Restrictions: WBAT    Mobility  Bed Mobility Overal bed mobility: Needs Assistance Bed Mobility: Supine to Sit     Supine to sit: Min assist     General bed mobility comments: cues for sequence and use of L LE to self assist  Transfers Overall transfer level: Needs assistance Equipment used: Rolling walker (2 wheeled) Transfers: Sit to/from Stand Sit to Stand: Min assist         General transfer comment: cues fo LE management and use of UEs to self assist  Ambulation/Gait Ambulation/Gait assistance: Min assist;Min guard Ambulation Distance (Feet): 46 Feet Assistive device: Rolling walker (2 wheeled) Gait Pattern/deviations: Step-to pattern;Decreased step length - right;Decreased step length - left;Shuffle Gait velocity: decr   General Gait Details: cues for posture, sequence and position from [employer]             Wheelchair Mobility    Modified Rankin (Stroke Patients Only)       Balance                                            Cognition Arousal/Alertness: Awake/alert Behavior During Therapy: WFL for tasks assessed/performed Overall  Cognitive Status: Within Functional Limits for tasks assessed                                        Exercises Total Joint Exercises Ankle Circles/Pumps: AROM;Both;15 reps;Supine Quad Sets: AROM;Both;10 reps;Supine Heel Slides: AAROM;Right;15 reps;Supine Straight Leg Raises: AAROM;AROM;Right;15 reps;Supine    General Comments        Pertinent Vitals/Pain Pain Assessment: 0-10 Pain Score: 6  Pain Location: R knee Pain Descriptors / Indicators: Aching;Sore Pain Intervention(s): Limited activity within patient's tolerance;Monitored during session;Premedicated before session;Ice applied    Home Living                      Prior Function            PT Goals (current goals can now be found in the care plan section) Acute Rehab PT Goals Patient Stated Goal: Regain IND  PT Goal Formulation: With patient Time For Goal Achievement: 12/03/17 Potential to Achieve Goals: Good Progress towards PT goals: Progressing toward goals    Frequency    7X/week      PT Plan Current plan remains appropriate    Co-evaluation              AM-PAC PT "6 Clicks" Daily Activity  Outcome Measure  Difficulty turning over in bed (including adjusting bedclothes, sheets and blankets)?: Unable Difficulty moving from lying on back to sitting on the side of the bed? : Unable Difficulty sitting down on and standing up from a chair with arms (e.g., wheelchair, bedside commode, etc,.)?: A Lot Help needed moving to and from a bed to chair (including a wheelchair)?: A Little Help needed walking in hospital room?: A Little Help needed climbing 3-5 steps with a railing? : A Little 6 Click Score: 13    End of Session Equipment Utilized During Treatment: Gait belt;Right knee immobilizer Activity Tolerance: Patient tolerated treatment well Patient left: in chair;with call bell/phone within reach Nurse Communication: Mobility status PT Visit Diagnosis: Difficulty in  walking, not elsewhere classified (R26.2)     Time: 7482-7078 PT Time Calculation (min) (ACUTE ONLY): 36 min  Charges:  $Gait Training: 8-22 mins $Therapeutic Exercise: 8-22 mins                    G Codes:       Pg [phone]    Donna Snooks 11/27/2017, 12:53 PM

## 2017-11-27 NOTE — Progress Notes (Signed)
Physical Therapy Treatment Patient Details Name: Beth Peters MRN: [id number] DOB: [birth year] Today's Date: 11/27/2017    History of Present Illness Pt s/p R TKR liner revision.  Pt with hx of Bil TKR    PT Comments     Pt progressing steadily with mobility and eager for return home.  Spouse present and reviewed stairs and home therex program with written instruction provided.  Follow Up Recommendations  Follow surgeon's recommendation for DC plan and follow-up therapies     Equipment Recommendations  None recommended by PT    Recommendations for Other Services       Precautions / Restrictions Precautions Precautions: Knee;Fall Required Braces or Orthoses: Knee Immobilizer - Right Knee Immobilizer - Right: Discontinue once straight leg raise with < 10 degree lag Restrictions Weight Bearing Restrictions: No Other Position/Activity Restrictions: WBAT    Mobility  Bed Mobility Overal bed mobility: Needs Assistance Bed Mobility: Sit to Supine     Supine to sit: Min assist Sit to supine: Min guard   General bed mobility comments: cues for sequence and use of L LE to self assist  Transfers Overall transfer level: Needs assistance Equipment used: Rolling walker (2 wheeled) Transfers: Sit to/from Stand Sit to Stand: Min guard;Supervision         General transfer comment: cues fo LE management and use of UEs to self assist  Ambulation/Gait Ambulation/Gait assistance: Min guard;Supervision Ambulation Distance (Feet): 75 Feet Assistive device: Rolling walker (2 wheeled) Gait Pattern/deviations: Step-to pattern;Decreased step length - right;Decreased step length - left;Shuffle Gait velocity: decr   General Gait Details: cues for posture, sequence and position from RW   Stairs Stairs: Yes Stairs assistance: Min assist Stair Management: No rails;Backwards;With walker;Step to pattern Number of Stairs: 4 General stair comments: 2 stairs twice with RW bkwd,  spouse assisting on second attempt.  Cues for sequence and foot/RW placement   Wheelchair Mobility    Modified Rankin (Stroke Patients Only)       Balance                                            Cognition Arousal/Alertness: Awake/alert Behavior During Therapy: WFL for tasks assessed/performed Overall Cognitive Status: Within Functional Limits for tasks assessed                                        Exercises Total Joint Exercises Ankle Circles/Pumps: AROM;Both;15 reps;Supine Quad Sets: AROM;Both;10 reps;Supine Short Arc Quad: AAROM;AROM;Right;10 reps;Supine Heel Slides: AAROM;Right;15 reps;Supine Straight Leg Raises: AAROM;AROM;Right;15 reps;Supine    General Comments        Pertinent Vitals/Pain Pain Assessment: 0-10 Pain Score: 5  Pain Location: R knee Pain Descriptors / Indicators: Aching;Sore Pain Intervention(s): Limited activity within patient's tolerance;Monitored during session;Premedicated before session;Ice applied    Home Living                      Prior Function            PT Goals (current goals can now be found in the care plan section) Acute Rehab PT Goals Patient Stated Goal: Regain IND  PT Goal Formulation: With patient Time For Goal Achievement: 12/03/17 Potential to Achieve Goals: Good Progress towards PT goals: Progressing toward goals    Frequency  7X/week      PT Plan Current plan remains appropriate    Co-evaluation              AM-PAC PT "6 Clicks" Daily Activity  Outcome Measure  Difficulty turning over in bed (including adjusting bedclothes, sheets and blankets)?: A Lot Difficulty moving from lying on back to sitting on the side of the bed? : A Lot Difficulty sitting down on and standing up from a chair with arms (e.g., wheelchair, bedside commode, etc,.)?: A Lot Help needed moving to and from a bed to chair (including a wheelchair)?: A Little Help needed walking  in hospital room?: A Little Help needed climbing 3-5 steps with a railing? : A Little 6 Click Score: 15    End of Session Equipment Utilized During Treatment: Gait belt;Right knee immobilizer Activity Tolerance: Patient tolerated treatment well Patient left: with call bell/phone within reach;in bed;with family/visitor present Nurse Communication: Mobility status PT Visit Diagnosis: Difficulty in walking, not elsewhere classified (R26.2)     Time: 1432-1510 PT Time Calculation (min) (ACUTE ONLY): 38 min  Charges:  $Gait Training: 8-22 mins $Therapeutic Exercise: 8-22 mins $Therapeutic Activity: 8-22 mins                    G Codes:       Pg [phone]    Sharniece Gibbon 11/27/2017, 3:22 PM

## 2017-12-01 ENCOUNTER — Telehealth: Payer: Self-pay | Admitting: Genetics

## 2017-12-01 DIAGNOSIS — R531 Weakness: Secondary | ICD-10-CM | POA: Diagnosis not present

## 2017-12-01 DIAGNOSIS — Z96651 Presence of right artificial knee joint: Secondary | ICD-10-CM | POA: Diagnosis not present

## 2017-12-01 DIAGNOSIS — M25561 Pain in right knee: Secondary | ICD-10-CM | POA: Diagnosis not present

## 2017-12-01 DIAGNOSIS — M25661 Stiffness of right knee, not elsewhere classified: Secondary | ICD-10-CM | POA: Diagnosis not present

## 2017-12-01 NOTE — Discharge Summary (Signed)
Physician Discharge Summary   Patient ID: Beth Peters MRN: [id number] DOB/AGE: October 06, 1950 67 y.o.  Admit date: 11/26/2017 Discharge date: 11/27/2017  Primary Diagnosis: Unstable right total knee  Admission Diagnoses:  Past Medical History:  Diagnosis Date  . Achalasia of esophagus    s/p  heller myotomy hiatal hernia repair 2006  and x3  dilatation's  . Anemia   . Asthma   . Chronic back pain    lumbar and thoracic  . Chronic constipation   . Cubital tunnel syndrome, bilateral   . Degenerative arthritis of spine   . Esophageal dysmotility    ultrasound 11-15-2011  . Family history of adverse reaction to anesthesia    sister and mother-- ponv  . Gastrointestinal dysmotility   . GERD (gastroesophageal reflux disease)   . Hand tingling    fingers bilateral hand due to bilateral cubital tunnel syndrome  . History of esophageal spasm    s/p  esophageal dilation  . History of hiatal hernia    s/p  repair 2006  . Hypercholesterolemia 2009  . Hypertension   . Lactose intolerance   . 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 OA (osteoarthritis)   . Osteopenia   . Panic disorder   . Polyarthralgia   . PONV (postoperative nausea and vomiting)   . Raynauds syndrome   . Scoliosis of thoracic spine 8/01  . Seasonal affective disorder ([place])   . SUI (stress urinary incontinence, female)   . Superficial thrombophlebitis    x2  left lower leg superficial in  2006  prior to vein stripping    Discharge Diagnoses:   Principal Problem:   Failed total knee arthroplasty ([place])  Estimated body mass index is 27.52 kg/m as calculated from the following:   Height as of this encounter: '5\' 3"'$  (1.6 m).   Weight as of this encounter: 70.5 kg (155 lb 6 oz).  Procedure:  Procedure(s) (LRB): Right knee polyethylene revision (Right)   Consults: None  HPI: The patient is a 67 year old female who is over 9 years out from right total knee arthroplasty (11 years out from left total knee). The  patient states that she is not doing well at this time. The pain is under fair control at this time and describe their pain as mild to moderate. She states that she feels some instability in both knees, and has difficulty, especially with right knee, going down steps. [name] just recently moved back to [place] from [place]. She has been having some trouble with the RIGHT knee. She mainly has pain when she is going downstairs. The knee gives out on her at times. She does not have swelling. There is some soreness in the LEFT knee also with the RIGHT one is most problematic. She saw an orthopedist in [place] who said the knee was unstable and that it was loose. He recommended revision. Radiographs-AP and lateral of both knees show both prostheses in excellent position with no periprosthetic abnormalities. There is no evidence of any wear or lysis. Her RIGHT knee is very problematic. She does have some laxity that was not present previously. She may have had a little laxity before but has gotten worse. It has no signs of any periprosthetic loosening. There is a chance this may can be fixed with a thicker polyethylene. We discussed revision surgery and she definitely would like to do that. I told her more than likely she would be able to just have a polyethylene revision but there is a  very small chance we have to revise the entire knee. We discussed all that in detail and she would like to go ahead and proceed.    Laboratory Data: Admission on 11/26/2017, Discharged on 11/27/2017  Component Date Value Ref Range Status  . WBC 11/27/2017 10.5  4.0 - 10.5 K/uL Final  . RBC 11/27/2017 4.21  3.87 - 5.11 MIL/uL Final  . Hemoglobin 11/27/2017 13.0  12.0 - 15.0 g/dL Final  . HCT 11/27/2017 39.3  36.0 - 46.0 % Final  . MCV 11/27/2017 93.3  78.0 - 100.0 fL Final  . MCH 11/27/2017 30.9  26.0 - 34.0 pg Final  . MCHC 11/27/2017 33.1  30.0 - 36.0 g/dL Final  . RDW 11/27/2017 13.1  11.5 - 15.5 % Final  .  Platelets 11/27/2017 184  150 - 400 K/uL Final   Performed at [facility], [place] [address] [place], [place] [address]  . Sodium 11/27/2017 140  135 - 145 mmol/L Final  . Potassium 11/27/2017 3.4* 3.5 - 5.1 mmol/L Final  . Chloride 11/27/2017 105  101 - 111 mmol/L Final  . CO2 11/27/2017 24  22 - 32 mmol/L Final  . Glucose, Bld 11/27/2017 124* 65 - 99 mg/dL Final  . BUN 11/27/2017 7  6 - 20 mg/dL Final  . Creatinine, Ser 11/27/2017 0.77  0.44 - 1.00 mg/dL Final  . Calcium 11/27/2017 9.2  8.9 - 10.3 mg/dL Final  . GFR calc non Af Amer 11/27/2017 >60  >60 mL/min Final  . GFR calc Af Amer 11/27/2017 >60  >60 mL/min Final   Comment: (NOTE) The eGFR has been calculated using the CKD EPI equation. This calculation has not been validated in all clinical situations. eGFR's persistently <60 mL/min signify possible Chronic Kidney Disease.   Georgiann Hahn gap 11/27/2017 11  5 - 15 Final   Performed at [facility], [place] [address] [place], [place] [address]  Office Visit on 11/20/2017  Component Date Value Ref Range Status  . Adequacy 11/21/2017 Satisfactory for evaluation. The presence or absence of an endocervical / transformation zone component cannot be determined because of atrophy.   Final  . Diagnosis 11/21/2017 NEGATIVE FOR INTRAEPITHELIAL LESIONS OR MALIGNANCY.   Final  . HPV 11/21/2017 NOT DETECTED   Final   Normal Reference Range - NOT Detected  . Material Submitted 11/21/2017 CervicoVaginal Pap [ThinPrep Imaged]   Final  Hospital Outpatient Visit on 11/19/2017  Component Date Value Ref Range Status  . aPTT 11/19/2017 26  24 - 36 seconds Final   Performed at [facility], [place] [address], [place], [place] [address]  . WBC 11/19/2017 7.9  4.0 - 10.5 K/uL Final  . RBC 11/19/2017 4.63  3.87 - 5.11 MIL/uL Final  . Hemoglobin 11/19/2017 14.6  12.0 - 15.0 g/dL Final  . HCT 11/19/2017 43.0  36.0 - 46.0 % Final  . MCV 11/19/2017 92.9  78.0  - 100.0 fL Final  . MCH 11/19/2017 31.5  26.0 - 34.0 pg Final  . MCHC 11/19/2017 34.0  30.0 - 36.0 g/dL Final  . RDW 11/19/2017 13.0  11.5 - 15.5 % Final  . Platelets 11/19/2017 215  150 - 400 K/uL Final   Performed at [facility], [place] [address] [place], [place] [address]  . Sodium 11/19/2017 137  135 - 145 mmol/L Final  . Potassium 11/19/2017 4.6  3.5 - 5.1 mmol/L Final  . Chloride 11/19/2017 100* 101 - 111 mmol/L Final  .  CO2 11/19/2017 26  22 - 32 mmol/L Final  . Glucose, Bld 11/19/2017 104* 65 - 99 mg/dL Final  . BUN 11/19/2017 10  6 - 20 mg/dL Final  . Creatinine, Ser 11/19/2017 0.84  0.44 - 1.00 mg/dL Final  . Calcium 11/19/2017 9.5  8.9 - 10.3 mg/dL Final  . Total Protein 11/19/2017 6.6  6.5 - 8.1 g/dL Final  . Albumin 11/19/2017 4.0  3.5 - 5.0 g/dL Final  . AST 11/19/2017 21  15 - 41 U/L Final  . ALT 11/19/2017 24  14 - 54 U/L Final  . Alkaline Phosphatase 11/19/2017 64  38 - 126 U/L Final  . Total Bilirubin 11/19/2017 0.5  0.3 - 1.2 mg/dL Final  . GFR calc non Af Amer 11/19/2017 >60  >60 mL/min Final  . GFR calc Af Amer 11/19/2017 >60  >60 mL/min Final   Comment: (NOTE) The eGFR has been calculated using the CKD EPI equation. This calculation has not been validated in all clinical situations. eGFR's persistently <60 mL/min signify possible Chronic Kidney Disease.   Georgiann Hahn gap 11/19/2017 11  5 - 15 Final   Performed at [facility], [place] [address] [place], [place] [address]  . Prothrombin Time 11/19/2017 12.8  11.4 - 15.2 seconds Final  . INR 11/19/2017 0.97   Final   Performed at [facility], [place] [address], [place], [place] [address]  . ABO/RH(D) 11/19/2017 AB POS   Final  . Antibody Screen 11/19/2017 NEG   Final  . Sample Expiration 11/19/2017 11/29/2017   Final  . Extend sample reason 11/19/2017    Final                   Value:NO TRANSFUSIONS OR PREGNANCY IN THE PAST 3 MONTHS Performed at [facility], [place] [address], [place], [place] [address]   . MRSA, PCR 11/19/2017 INVALID RESULTS, SPECIMEN SENT FOR CULTURE* NEGATIVE Final  . Staphylococcus aureus 11/19/2017 INVALID RESULTS, SPECIMEN SENT FOR CULTURE* NEGATIVE Final   Comment: (NOTE) The Xpert SA Assay (FDA approved for NASAL specimens in patients 21 years of age and older), is one component of a comprehensive surveillance program. It is not intended to diagnose infection nor to guide or monitor treatment. Performed at [facility], [place] [address], [place], [place] [address]   . Specimen Description 11/19/2017    Final                   Value:NOSE Performed at [facility], [place] [address], [place], [place] [address]   . Special Requests 11/19/2017    Final                   Value:NONE Performed at [facility], [place] [address], [place], [place] [address]   . Culture 11/19/2017    Final                   Value:NO MRSA DETECTED Performed at [facility], [place] [address] [place],  [address]   . Report Status 11/19/2017 11/21/2017 FINAL   Final  Office Visit on 10/29/2017  Component Date Value Ref Range Status  . Sodium 10/29/2017 139  135 - 145 mEq/L Final  . Potassium 10/29/2017 4.2  3.5 - 5.1 mEq/L Final  . Chloride 10/29/2017 102  96 - 112 mEq/L Final  . CO2 10/29/2017 30  19 - 32 mEq/L Final  . Glucose,  Bld 10/29/2017 101* 70 - 99 mg/dL Final  . BUN 10/29/2017 12  6 - 23 mg/dL Final  . Creatinine, Ser 10/29/2017 0.87  0.40 - 1.20 mg/dL Final  . Total Bilirubin 10/29/2017 0.5  0.2 - 1.2 mg/dL Final  . Alkaline Phosphatase 10/29/2017 60  39 - 117 U/L Final  . AST 10/29/2017 15  0 - 37 U/L Final  . ALT 10/29/2017 20  0 - 35 U/L Final  . Total Protein 10/29/2017 6.5  6.0 - 8.3 g/dL Final  . Albumin 10/29/2017 4.2  3.5 - 5.2 g/dL Final  . Calcium 10/29/2017 9.7  8.4 - 10.5 mg/dL Final  . GFR 10/29/2017 69.34  >60.00 mL/min Final  .  WBC 10/29/2017 7.1  4.0 - 10.5 K/uL Final  . RBC 10/29/2017 4.59  3.87 - 5.11 Mil/uL Final  . Hemoglobin 10/29/2017 14.5  12.0 - 15.0 g/dL Final  . HCT 10/29/2017 42.6  36.0 - 46.0 % Final  . MCV 10/29/2017 93.0  78.0 - 100.0 fl Final  . MCHC 10/29/2017 34.0  30.0 - 36.0 g/dL Final  . RDW 10/29/2017 13.4  11.5 - 15.5 % Final  . Platelets 10/29/2017 184.0  150.0 - 400.0 K/uL Final  . Neutrophils Relative % 10/29/2017 60.7  43.0 - 77.0 % Final  . Lymphocytes Relative 10/29/2017 30.6  12.0 - 46.0 % Final  . Monocytes Relative 10/29/2017 5.1  3.0 - 12.0 % Final  . Eosinophils Relative 10/29/2017 3.0  0.0 - 5.0 % Final  . Basophils Relative 10/29/2017 0.6  0.0 - 3.0 % Final  . Neutro Abs 10/29/2017 4.3  1.4 - 7.7 K/uL Final  . Lymphs Abs 10/29/2017 2.2  0.7 - 4.0 K/uL Final  . Monocytes Absolute 10/29/2017 0.4  0.1 - 1.0 K/uL Final  . Eosinophils Absolute 10/29/2017 0.2  0.0 - 0.7 K/uL Final  . Basophils Absolute 10/29/2017 0.0  0.0 - 0.1 K/uL Final  . Color, Urine 10/29/2017 YELLOW  Yellow;Lt. Yellow Final  . APPearance 10/29/2017 CLEAR  Clear Final  . Specific Gravity, Urine 10/29/2017 <=1.005* 1.000 - 1.030 Final  . pH 10/29/2017 6.0  5.0 - 8.0 Final  . Total Protein, Urine 10/29/2017 NEGATIVE  Negative Final  . Urine Glucose 10/29/2017 NEGATIVE  Negative Final  . Ketones, ur 10/29/2017 NEGATIVE  Negative Final  . Bilirubin Urine 10/29/2017 NEGATIVE  Negative Final  . Hgb urine dipstick 10/29/2017 NEGATIVE  Negative Final  . Urobilinogen, UA 10/29/2017 0.2  0.0 - 1.0 Final  . Leukocytes, UA 10/29/2017 NEGATIVE  Negative Final  . Nitrite 10/29/2017 NEGATIVE  Negative Final  . WBC, UA 10/29/2017 none seen  0-2/hpf Final  . RBC / HPF 10/29/2017 none seen  0-2/hpf Final      Hospital Course: BRINLY MAIETTA is a 67 y.o. who was admitted to [facility]. They were brought to the operating room on 11/26/2017 and underwent Procedure(s): Right knee polyethylene revision.  Patient  tolerated the procedure well and was later transferred to the recovery room and then to the orthopaedic floor for postoperative care.  They were given PO and IV analgesics for pain control following their surgery.  They were given 24 hours of postoperative antibiotics of  Anti-infectives (From admission, onward)   Start     Dose/Rate Route Frequency Ordered Stop   11/26/17 1630  ceFAZolin (ANCEF) IVPB 1 g/50 mL premix     1 g 100 mL/hr over 30 Minutes Intravenous Every 6 hours 11/26/17 1247 11/26/17 2229  11/26/17 0800  ceFAZolin (ANCEF) IVPB 2g/100 mL premix     2 g 200 mL/hr over 30 Minutes Intravenous On call to O.R. 11/26/17 0981 11/26/17 1015     and started on DVT prophylaxis in the form of Aspirin.   PT and OT were ordered for total joint protocol.  Discharge planning consulted to help with postop disposition and equipment needs.  Patient had a good night on the evening of surgery.  They started to get up OOB with therapy on day one. Hemovac drain was pulled without difficulty.  The patient had progressed with therapy and meeting their goals.  Incision was healing well.  Patient was seen in rounds and was ready to go home.   Diet: Cardiac diet Activity:WBAT Follow-up:in 2 weeks Disposition - Home Discharged Condition: stable   Discharge Instructions    Call MD / Call 911   Complete by:  As directed    If you experience chest pain or shortness of breath, CALL 911 and be transported to the hospital emergency room.  If you develope a fever above 101 F, pus (white drainage) or increased drainage or redness at the wound, or calf pain, call your surgeon's office.   Constipation Prevention   Complete by:  As directed    Drink plenty of fluids.  Prune juice may be helpful.  You may use a stool softener, such as Colace (over the counter) 100 mg twice a day.  Use MiraLax (over the counter) for constipation as needed.   Diet - low sodium heart healthy   Complete by:  As directed     Discharge instructions   Complete by:  As directed    Dr. Gaynelle Arabian Total Joint Specialist [facility] [address] [place], [place] [address] [phone]) [place] items at home which could result in a fall. This includes throw rugs or furniture in walking pathways.  ICE to the affected hip every three hours for 30 minutes at a time and then as needed for pain and swelling.  Continue to use ice on the hip for pain and swelling from surgery. You may notice swelling that will progress down to the foot and ankle.  This is normal after surgery.  Elevate the leg when you are not up walking on it.   Continue to use the breathing machine which will help keep your temperature down.  It is common for your temperature to cycle up and down following surgery, especially at night when you are not up moving around and exerting yourself.  The breathing machine keeps your lungs expanded and your temperature down.   DIET You may resume your previous home diet once your are discharged from the hospital.  DRESSING / WOUND CARE / SHOWERING You may change your dressing every day with sterile gauze.  Please use good hand washing techniques before changing the dressing.  Do not use any lotions or creams on the incision until instructed by your surgeon. You may start showering once you are discharged home but do not submerge the incision under water. Just pat the incision dry and apply a dry gauze dressing on daily. Change the surgical dressing daily and reapply a dry dressing each time.  ACTIVITY Walk with your walker as instructed. Use walker as long as suggested by your caregivers. Avoid periods of inactivity such as sitting longer than an hour when not asleep. This helps prevent blood clots.  You may resume a sexual relationship  in one month or when given the OK by your doctor.  You may return to work once you are cleared by your doctor.  Do not drive a car for 6  weeks or until released by you surgeon.  Do not drive while taking narcotics.  WEIGHT BEARING Weight bearing as tolerated with assist device (walker, cane, etc) as directed, use it as long as suggested by your surgeon or therapist, typically at least 4-6 weeks.  POSTOPERATIVE CONSTIPATION PROTOCOL Constipation - defined medically as fewer than three stools per week and severe constipation as less than one stool per week.  One of the most common issues patients have following surgery is constipation.  Even if you have a regular bowel pattern at home, your normal regimen is likely to be disrupted due to multiple reasons following surgery.  Combination of anesthesia, postoperative narcotics, change in appetite and fluid intake all can affect your bowels.  In order to avoid complications following surgery, here are some recommendations in order to help you during your recovery period.  Colace (docusate) - Pick up an over-the-counter form of Colace or another stool softener and take twice a day as long as you are requiring postoperative pain medications.  Take with a full glass of water daily.  If you experience loose stools or diarrhea, hold the colace until you stool forms back up.  If your symptoms do not get better within 1 week or if they get worse, check with your doctor.  Dulcolax (bisacodyl) - Pick up over-the-counter and take as directed by the product packaging as needed to assist with the movement of your bowels.  Take with a full glass of water.  Use this product as needed if not relieved by Colace only.   MiraLax (polyethylene glycol) - Pick up over-the-counter to have on hand.  MiraLax is a solution that will increase the amount of water in your bowels to assist with bowel movements.  Take as directed and can mix with a glass of water, juice, soda, coffee, or tea.  Take if you go more than two days without a movement. Do not use MiraLax more than once per day. Call your doctor if you are  still constipated or irregular after using this medication for 7 days in a row.  If you continue to have problems with postoperative constipation, please contact the office for further assistance and recommendations.  If you experience "the worst abdominal pain ever" or develop nausea or vomiting, please contact the office immediatly for further recommendations for treatment.  ITCHING  If you experience itching with your medications, try taking only a single pain pill, or even half a pain pill at a time.  You can also use Benadryl over the counter for itching or also to help with sleep.   TED HOSE STOCKINGS Wear the elastic stockings on both legs for three weeks following surgery during the day but you may remove then at night for sleeping.  MEDICATIONS See your medication summary on the "After Visit Summary" that the nursing staff will review with you prior to discharge.  You may have some home medications which will be placed on hold until you complete the course of blood thinner medication.  It is important for you to complete the blood thinner medication as prescribed by your surgeon.  Continue your approved medications as instructed at time of discharge.  PRECAUTIONS If you experience chest pain or shortness of breath - call 911 immediately for transfer to the hospital emergency department.  If you develop a fever greater that 101 F, purulent drainage from wound, increased redness or drainage from wound, foul odor from the wound/dressing, or calf pain - CONTACT YOUR SURGEON.                                                   FOLLOW-UP APPOINTMENTS Make sure you keep all of your appointments after your operation with your surgeon and caregivers. You should call the office at the above phone number and make an appointment for approximately two weeks after the date of your surgery or on the date instructed by your surgeon outlined in the "After Visit Summary".   IF YOU ARE TRANSFERRED TO A  SKILLED REHAB FACILITY If the patient is transferred to a skilled rehab facility following release from the hospital, a list of the current medications will be sent to the facility for the patient to continue.  When discharged from the skilled rehab facility, please have the facility set up the patient's [place] prior to being released. Also, the skilled facility will be responsible for providing the patient with their medications at time of release from the facility to include their pain medication, the muscle relaxants, and their blood thinner medication. If the patient is still at the rehab facility at time of the two week follow up appointment, the skilled rehab facility will also need to assist the patient in arranging follow up appointment in our office and any transportation needs.  MAKE SURE YOU:  Understand these instructions.  Get help right away if you are not doing well or get worse.    Pick up stool softner and laxative for home use following surgery while on pain medications. Do not submerge incision under water. Please use good hand washing techniques while changing dressing each day. May shower starting three days after surgery. Please use a clean towel to pat the incision dry following showers. Continue to use ice for pain and swelling after surgery. Do not use any lotions or creams on the incision until instructed by your surgeon.   Increase activity slowly as tolerated   Complete by:  As directed      Allergies as of 11/27/2017      Reactions   Dilaudid [hydromorphone Hcl] Hives, Shortness Of Breath, Itching   Glucagon Anaphylaxis   Lactose Intolerance (gi)    Wasp Venom Itching, Swelling      Medication List    TAKE these medications   ADVAIR DISKUS 250-50 MCG/DOSE Aepb Generic drug:  Fluticasone-Salmeterol Inhale 1 puff into the lungs 2 (two) times daily.   aspirin 325 MG EC tablet Take 1 tablet (325 mg total) by mouth daily with  breakfast.   buPROPion 150 MG 24 hr tablet Commonly known as:  WELLBUTRIN XL Take 1 tablet (150 mg total) by mouth daily. What changed:  when to take this   CALCIUM PO Take 15 mLs by mouth daily.   denosumab 60 MG/ML Sosy injection Commonly known as:  PROLIA Inject 60 mg into the skin every 6 (six) months.   docusate sodium 100 MG capsule Commonly known as:  COLACE Take 100 mg by mouth 2 (two) times daily as needed for mild constipation.   esomeprazole 40 MG capsule Commonly known as:  NEXIUM Take 40 mg by mouth 2 (two) times daily. Take 40 mg  by mouth daily. May take additional 40 mg if needed for acid reflux   gabapentin 100 MG capsule Commonly known as:  NEURONTIN One tablet po q hs, may increase to 2 tablets po q hs after 3 days and to 3 tablets q hs after another 3 days if tolerated.   hydrochlorothiazide 12.5 MG capsule Commonly known as:  MICROZIDE Take 1 capsule (12.5 mg total) by mouth daily. What changed:  when to take this   methocarbamol 500 MG tablet Commonly known as:  ROBAXIN Take 1 tablet (500 mg total) by mouth every 6 (six) hours as needed for muscle spasms.   MIRALAX PO Take by mouth as needed.   oxyCODONE 5 MG/5ML solution Commonly known as:  ROXICODONE Take 5-10 mLs (5-10 mg total) by mouth every 4 (four) hours as needed for moderate pain.   raloxifene 60 MG tablet Commonly known as:  EVISTA Take 1 tablet (60 mg total) by mouth daily.   Vitamin D3 1000 units Caps Take 1,000 Units by mouth daily.      Follow-up Information    Gaynelle Arabian, MD. Schedule an appointment as soon as possible for a visit on 12/09/2017.   Specialty:  Orthopedic Surgery Contact information: [address] [place] [address] [phone]           Signed: Ardeen Jourdain, PA-C Orthopaedic Surgery 12/01/2017, 7:21 AM

## 2017-12-01 NOTE — Telephone Encounter (Signed)
Pt cld to reschedule her genetic counseling appt. Appt has been rescheduled for the pt to see Ferol Luz on 7/2 at 11am. Pt agreed to the appt date and time.

## 2017-12-04 DIAGNOSIS — Z96651 Presence of right artificial knee joint: Secondary | ICD-10-CM | POA: Diagnosis not present

## 2017-12-04 DIAGNOSIS — M25661 Stiffness of right knee, not elsewhere classified: Secondary | ICD-10-CM | POA: Diagnosis not present

## 2017-12-04 DIAGNOSIS — M25561 Pain in right knee: Secondary | ICD-10-CM | POA: Diagnosis not present

## 2017-12-04 DIAGNOSIS — R531 Weakness: Secondary | ICD-10-CM | POA: Diagnosis not present

## 2017-12-11 DIAGNOSIS — M25661 Stiffness of right knee, not elsewhere classified: Secondary | ICD-10-CM | POA: Diagnosis not present

## 2017-12-11 DIAGNOSIS — M25561 Pain in right knee: Secondary | ICD-10-CM | POA: Diagnosis not present

## 2017-12-11 DIAGNOSIS — Z96651 Presence of right artificial knee joint: Secondary | ICD-10-CM | POA: Diagnosis not present

## 2017-12-11 DIAGNOSIS — R531 Weakness: Secondary | ICD-10-CM | POA: Diagnosis not present

## 2017-12-13 DIAGNOSIS — M25561 Pain in right knee: Secondary | ICD-10-CM | POA: Diagnosis not present

## 2017-12-13 DIAGNOSIS — M25661 Stiffness of right knee, not elsewhere classified: Secondary | ICD-10-CM | POA: Diagnosis not present

## 2017-12-13 DIAGNOSIS — R531 Weakness: Secondary | ICD-10-CM | POA: Diagnosis not present

## 2017-12-13 DIAGNOSIS — Z96651 Presence of right artificial knee joint: Secondary | ICD-10-CM | POA: Diagnosis not present

## 2017-12-15 DIAGNOSIS — R531 Weakness: Secondary | ICD-10-CM | POA: Diagnosis not present

## 2017-12-15 DIAGNOSIS — Z96651 Presence of right artificial knee joint: Secondary | ICD-10-CM | POA: Diagnosis not present

## 2017-12-15 DIAGNOSIS — M25661 Stiffness of right knee, not elsewhere classified: Secondary | ICD-10-CM | POA: Diagnosis not present

## 2017-12-15 DIAGNOSIS — M25561 Pain in right knee: Secondary | ICD-10-CM | POA: Diagnosis not present

## 2017-12-17 DIAGNOSIS — R531 Weakness: Secondary | ICD-10-CM | POA: Diagnosis not present

## 2017-12-17 DIAGNOSIS — Z96651 Presence of right artificial knee joint: Secondary | ICD-10-CM | POA: Diagnosis not present

## 2017-12-17 DIAGNOSIS — M25661 Stiffness of right knee, not elsewhere classified: Secondary | ICD-10-CM | POA: Diagnosis not present

## 2017-12-17 DIAGNOSIS — M25561 Pain in right knee: Secondary | ICD-10-CM | POA: Diagnosis not present

## 2017-12-22 DIAGNOSIS — M25561 Pain in right knee: Secondary | ICD-10-CM | POA: Diagnosis not present

## 2017-12-22 DIAGNOSIS — M25661 Stiffness of right knee, not elsewhere classified: Secondary | ICD-10-CM | POA: Diagnosis not present

## 2017-12-22 DIAGNOSIS — Z96651 Presence of right artificial knee joint: Secondary | ICD-10-CM | POA: Diagnosis not present

## 2017-12-22 DIAGNOSIS — R531 Weakness: Secondary | ICD-10-CM | POA: Diagnosis not present

## 2017-12-24 DIAGNOSIS — M25561 Pain in right knee: Secondary | ICD-10-CM | POA: Diagnosis not present

## 2017-12-24 DIAGNOSIS — M25661 Stiffness of right knee, not elsewhere classified: Secondary | ICD-10-CM | POA: Diagnosis not present

## 2017-12-24 DIAGNOSIS — Z96651 Presence of right artificial knee joint: Secondary | ICD-10-CM | POA: Diagnosis not present

## 2017-12-24 DIAGNOSIS — R531 Weakness: Secondary | ICD-10-CM | POA: Diagnosis not present

## 2017-12-29 DIAGNOSIS — M25561 Pain in right knee: Secondary | ICD-10-CM | POA: Diagnosis not present

## 2017-12-29 DIAGNOSIS — R531 Weakness: Secondary | ICD-10-CM | POA: Diagnosis not present

## 2017-12-29 DIAGNOSIS — M25661 Stiffness of right knee, not elsewhere classified: Secondary | ICD-10-CM | POA: Diagnosis not present

## 2017-12-29 DIAGNOSIS — Z96651 Presence of right artificial knee joint: Secondary | ICD-10-CM | POA: Diagnosis not present

## 2017-12-30 DIAGNOSIS — Z96651 Presence of right artificial knee joint: Secondary | ICD-10-CM | POA: Diagnosis not present

## 2017-12-30 DIAGNOSIS — Z471 Aftercare following joint replacement surgery: Secondary | ICD-10-CM | POA: Diagnosis not present

## 2017-12-31 DIAGNOSIS — Z96651 Presence of right artificial knee joint: Secondary | ICD-10-CM | POA: Diagnosis not present

## 2017-12-31 DIAGNOSIS — M25661 Stiffness of right knee, not elsewhere classified: Secondary | ICD-10-CM | POA: Diagnosis not present

## 2017-12-31 DIAGNOSIS — R531 Weakness: Secondary | ICD-10-CM | POA: Diagnosis not present

## 2017-12-31 DIAGNOSIS — M25561 Pain in right knee: Secondary | ICD-10-CM | POA: Diagnosis not present

## 2018-01-05 ENCOUNTER — Other Ambulatory Visit: Payer: Self-pay

## 2018-01-05 ENCOUNTER — Encounter: Payer: Self-pay | Admitting: Genetic Counselor

## 2018-01-13 ENCOUNTER — Inpatient Hospital Stay: Payer: PPO

## 2018-01-13 ENCOUNTER — Inpatient Hospital Stay: Payer: PPO | Attending: Genetic Counselor | Admitting: Genetics

## 2018-01-13 ENCOUNTER — Encounter: Payer: Self-pay | Admitting: Genetics

## 2018-01-13 DIAGNOSIS — Z808 Family history of malignant neoplasm of other organs or systems: Secondary | ICD-10-CM

## 2018-01-13 DIAGNOSIS — Z7183 Encounter for nonprocreative genetic counseling: Secondary | ICD-10-CM | POA: Diagnosis not present

## 2018-01-13 DIAGNOSIS — Z8052 Family history of malignant neoplasm of bladder: Secondary | ICD-10-CM | POA: Diagnosis not present

## 2018-01-13 DIAGNOSIS — Z800 Family history of malignant neoplasm of digestive organs: Secondary | ICD-10-CM

## 2018-01-13 DIAGNOSIS — Z803 Family history of malignant neoplasm of breast: Secondary | ICD-10-CM | POA: Insufficient documentation

## 2018-01-13 DIAGNOSIS — Z8049 Family history of malignant neoplasm of other genital organs: Secondary | ICD-10-CM

## 2018-01-13 DIAGNOSIS — Z8042 Family history of malignant neoplasm of prostate: Secondary | ICD-10-CM | POA: Diagnosis not present

## 2018-01-13 DIAGNOSIS — Z801 Family history of malignant neoplasm of trachea, bronchus and lung: Secondary | ICD-10-CM

## 2018-01-13 DIAGNOSIS — Z8043 Family history of malignant neoplasm of testis: Secondary | ICD-10-CM

## 2018-01-13 NOTE — Progress Notes (Signed)
REFERRING PROVIDER: Salvadore Dom, MD [address], [place] [address]  PRIMARY PROVIDER:  Briscoe Deutscher, DO  PRIMARY REASON FOR VISIT:  1. Family history of uterine cancer   2. Family history of breast cancer   3. Family history of prostate cancer   4. Family history of testicular cancer   5. Family history of rectal cancer   6. Family history of bladder cancer   7. Family history of brain cancer   8. Family history of lung cancer     HISTORY OF PRESENT ILLNESS:   Beth Peters, a 67 y.o. female, was seen for a [place] cancer genetics consultation at the request of Dr. Talbert Nan due to a family history of cancer.  Beth Peters presents to clinic today to discuss the possibility of a hereditary predisposition to cancer, genetic testing, and to further clarify her future cancer risks, as well as potential cancer risks for family members.   Beth Peters is a 67 y.o. female with no personal history of cancer.    HORMONAL RISK FACTORS:  Menarche was at age 27.  First live birth at age 50.  OCP use for approximately 15-20 years years.  Ovaries intact: yes.  Hysterectomy: no.  Menopausal status: postmenopausal.  HRT use: cream  Estr, test, progest Colonoscopy: yes; abnormal. Mammogram within the last year: yes. Number of breast biopsies: 0.  Past Medical History:  Diagnosis Date  . Achalasia of esophagus    s/p  heller myotomy hiatal hernia repair 2006  and x3  dilatation's  . Anemia   . Asthma   . Chronic back pain    lumbar and thoracic  . Chronic constipation   . Cubital tunnel syndrome, bilateral   . Degenerative arthritis of spine   . Esophageal dysmotility    ultrasound 11-15-2011  . Family history of adverse reaction to anesthesia    sister and mother-- ponv  . Family history of bladder cancer   . Family history of brain cancer   . Family history of breast cancer   . Family history of lung cancer   . Family history of prostate cancer     . Family history of rectal cancer   . Family history of testicular cancer   . Family history of uterine cancer   . Gastrointestinal dysmotility   . GERD (gastroesophageal reflux disease)   . Hand tingling    fingers bilateral hand due to bilateral cubital tunnel syndrome  . History of esophageal spasm    s/p  esophageal dilation  . History of hiatal hernia    s/p  repair 2006  . Hypercholesterolemia 2009  . Hypertension   . Lactose intolerance   . 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 OA (osteoarthritis)   . Osteopenia   . Panic disorder   . Polyarthralgia   . PONV (postoperative nausea and vomiting)   . Raynauds syndrome   . Scoliosis of thoracic spine 8/01  . Seasonal affective disorder ([place])   . SUI (stress urinary incontinence, female)   . Superficial thrombophlebitis    x2  left lower leg superficial in  2006  prior to vein stripping     Past Surgical History:  Procedure Laterality Date  . ABDOMINAL SURGERY    . D & C HYSTEROSCOPY W/ RESECTION FIBROID  09-21-2002  dr Mohammed Kindle  [facility] W/ RESECTION POLYP  12-24-2010  dr Joan Flores  [facility]  . DILATION AND CURETTAGE OF  UTERUS    . ESOPHAGEAL DILATION  2013  --- [facility]   Omega dilation for Achalasia  . ESOPHAGOGASTRODUODENOSCOPY N/A 09/20/2017   Procedure: ESOPHAGOGASTRODUODENOSCOPY (EGD);  Surgeon: Clarene Essex, MD;  Location: [facility];  Service: Endoscopy;  Laterality: N/A;  . HELLER MYOTOMY  4656   fundoplication repair hiatal hernia  . KNEE ARTHROSCOPY Left 2006 &  09-27-2003  . KNEE ARTHROSCOPY W/ SYNOVECTOMY Right 09-20-2009  dr Wynelle Link  [facility]  . KNEE SURGERY Right 1962  . ROTATOR CUFF REPAIR Right 09/2011  . TONSILLECTOMY AND ADENOIDECTOMY  1961  . TOTAL KNEE ARTHROPLASTY Bilateral left 02-05-2006 ;  right 08-14-2007   dr Wynelle Link  [facility]  . TOTAL KNEE REVISION Right 11/26/2017   Procedure: Right knee polyethylene revision;  Surgeon: Gaynelle Arabian, MD;  Location: [facility];  Service: Orthopedics;  Laterality:  Right;  Adductor Block  . VARICOSE VEIN SURGERY  09/2005    Social History   Socioeconomic History  . Marital status: Married    Spouse name: Not on file  . Number of children: Not on file  . Years of education: Not on file  . Highest education level: Not on file  Occupational History  . Not on file  Social Needs  . Financial resource strain: Not on file  . Food insecurity:    Worry: Not on file    Inability: Not on file  . Transportation needs:    Medical: Not on file    Non-medical: Not on file  Tobacco Use  . Smoking status: Never Smoker  . Smokeless tobacco: Never Used  Substance and Sexual Activity  . Alcohol use: Yes    Alcohol/week: 3.6 - 4.8 oz    Types: 6 - 8 Glasses of wine per week  . Drug use: No  . Sexual activity: Yes    Partners: Male    Birth control/protection: Post-menopausal    Comment: vasectomy  Lifestyle  . Physical activity:    Days per week: Not on file    Minutes per session: Not on file  . Stress: Not on file  Relationships  . Social connections:    Talks on phone: Not on file    Gets together: Not on file    Attends religious service: Not on file    Active member of club or organization: Not on file    Attends meetings of clubs or organizations: Not on file    Relationship status: Not on file  Other Topics Concern  . Not on file  Social History Narrative  . Not on file     FAMILY HISTORY:  We obtained a detailed, 4-generation family history.  Significant diagnoses are listed below: Family History  Problem Relation Age of Onset  . Hypertension Father   . Heart disease Father   . Stroke Father   . Cancer Father        Testicular, Bladder, Prostate, Lung  . Heart disease Mother   . Stroke Mother   . Osteoporosis Mother   . Uterine cancer Sister 65  . Breast cancer Sister   . Cancer Sister        rectal, liver,lymph nodes  . Diabetes Maternal Grandfather   . Heart disease Maternal Grandfather   . Diabetes Paternal  Grandmother   . Breast cancer Maternal Aunt    Beth Peters has a 39 year-old son and a 54 year-old daughter with no history of cancer.  Beth Peters has 2 sisters and 1 brother: -1 sister was dx with  uterine cancer at 60, she later developed breast cancer, and then died of rectal cancer at the age of 106.  Her sister never had any genetic testing. She has 1 daughter who is 38 with no history of cancer.  -1 sister is 72 with no history of cancer, she had a hysterectomy in her 32's. She has no children -1 brother is 63 with no history of cancer.  He has 2 daughters.    Beth Peters's father: was dx with testicular cancer in his 105's, later had lung cancer, then dx with bladder cancer that recurred, and then had prostate cancer dx in his 24's.  He died in his 74's due to a stroke- his prostate cancer was never treated due to his comorbidities. These were all separate primary cancers (except for the bladder cancer recurrence). Paternal Aunts/Uncles: 1 paternal uncle 76 with no history of cancer, 1 paternal aunt died in her 52's with no history of cancer.  Paternal cousins: unk Paternal grandfather: hx of cancer, unk type Paternal grandmother:died of heart disease  Beth Peters. Billig's mother: died at 50, she had breast calcifications and large benign tumors in her uterus.  She had her uterus and ovaries removed in her 40's.  Maternal Aunts/Uncles: 3 maternal aunts, 1 maternal uncle: -1 maternal aunt died of breast cancer at 89 that was dx at 40.   -1 maternal uncle was dx with prostate cancer in his 64's,  -1 maternal aunt died of heart disease -1 maternal aunt is in her 29's with no history of cancer.  Maternal cousins: 1 maternal cousin died of brain cancer in her 37's.  Maternal grandfather: died at 31 due to heart attack Maternal grandmother:died in her 77's due to heart attack  Beth Peters. Bogen is unaware of previous family history of genetic testing for hereditary cancer risks. Patient's maternal  ancestors are of German/English descent, and paternal ancestors are of Swedish/Dutch descent. There is possibly some Ashkenazi Jewish ancestry 4-5 generations back. There is no known consanguinity.  GENETIC COUNSELING ASSESSMENT: DEICY RUSK is a 67 y.o. female with a family history which is somewhat suggestive of a Hereditary Cancer Predisposition Syndrome. We, therefore, discussed and recommended the following at today's visit.   DISCUSSION: We reviewed the characteristics, features and inheritance patterns of hereditary cancer syndromes. We also discussed genetic testing, including the appropriate family members to test, the process of testing, insurance coverage and turn-around-time for results. We discussed the implications of a negative, positive and/or variant of uncertain significant result. We recommended Beth Peters pursue genetic testing for the Common Hereditary Cancers gene panel.   The Common Hereditary Cancer Panel offered by [facility] includes sequencing and/or deletion duplication testing of the following 47 genes: APC, ATM, AXIN2, BARD1, BMPR1A, BRCA1, BRCA2, BRIP1, CDH1, CDKN2A (p14ARF), CDKN2A (p16INK4a), CKD4, CHEK2, CTNNA1, DICER1, EPCAM (Deletion/duplication testing only), GREM1 (promoter region deletion/duplication testing only), KIT, MEN1, MLH1, MSH2, MSH3, MSH6, MUTYH, NBN, NF1, NHTL1, PALB2, PDGFRA, PMS2, POLD1, POLE, PTEN, RAD50, RAD51C, RAD51D, SDHB, SDHC, SDHD, SMAD4, SMARCA4. STK11, TP53, TSC1, TSC2, and VHL.  The following genes were evaluated for sequence changes only: SDHA and HOXB13 c.251G>A variant only.  We discussed that only 5-10% of cancers are associated with a Hereditary cancer predisposition syndrome.    The most common hereditary cancer syndrome associated with uterine cancer is Lynch Syndrome.  Lynch Syndrome is caused by mutations in the genes: MLH1, MSH2, MSH6, PMS2 and EPCAM.  This syndrome increases the risk for colon, uterine, ovarian and stomach  cancers,  as well as others.  Families with Lynch Syndrome tend to have multiple family members with these cancers, typically diagnosed under age 31, and diagnoses in multiple generations.  One of the most common hereditary cancer syndromes that increases breast/prostate cancer risk is called Hereditary Breast and Ovarian Cancer (HBOC) syndrome.  This syndrome is caused by mutations in the BRCA1 and BRCA2 genes.  This syndrome increases an individual's lifetime risk to develop breast, ovarian, pancreatic, and other types of cancer.  There are also many other cancer predisposition syndromes caused by mutations in several other genes.  We discussed that if she is found to have a mutation in one of these genes, it may impact future medical management recommendations such as increased cancer screenings and consideration of risk reducing surgeries.  A positive result could also have implications for the patient's family members.  A Negative result would mean we were unable to identify a hereditary predisposition to cancer in her cancer, but does not rule out the possibility of a hereditary predisposition cancer.  There could be mutations that are undetectable by current technology, or in genes not yet tested or identified to increase cancer risk.    We discussed the potential to find a Variant of Uncertain Significance or VUS.  These are variants that have not yet been identified as pathogenic or benign, and it is unknown if this variant is associated with increased cancer risk or if this is a normal finding.  Most VUS's are reclassified to benign or likely benign.   It should not be used to make medical management decisions. With time, we suspect the lab will determine the significance of any VUS's identified if any.   Based on Beth Peters's family history of cancer, she meets NCCN medical criteria for genetic testing. Despite that she meets criteria, insurance may or may not cover testing, and she may still  have an out of pocket cost. The laboratory can provide her with an estimate of her OOP cost.    Based on the patient's personal and family history, the statistical model (Tyrer Cusik)   Was used to estimate her risk of developing breast cancer. This estimates her lifetime risk of developing breast cancer to be approximately 23.4%. This estimation is in the setting of negative test results and may change if she has a positive genetic test result.  The patient's lifetime breast cancer risk is a preliminary estimate based on available information using one of several models endorsed by the [place] (ACS). The ACS recommends consideration of breast MRI screening as an adjunct to mammography for patients at high risk (defined as 20% or greater lifetime risk). A more detailed breast cancer risk assessment can be considered, if clinically indicated.     Beth Peters has been determined to be at high risk for breast cancer.  Therefore, we recommend that annual screening with mammography and breast MRI begin at age 63, or 10 years prior to the age of breast cancer diagnosis in a relative (whichever is earlier).  We discussed that Beth Peters should discuss her individual situation with her referring physician and determine a breast cancer screening plan with which they are both comfortable.    We discussed that some people do not want to undergo genetic testing due to fear of genetic discrimination.  A federal law called the Genetic Information Non-Discrimination Act (GINA) of 2008 helps protect individuals against genetic discrimination based on their genetic test results.  It impacts both health insurance and employment.  For health insurance, it protects against increased premiums, being kicked off insurance or being forced to take a test in order to be insured.  For employment it protects against hiring, firing and promoting decisions based on genetic test results.  Health status due to a cancer  diagnosis is not protected under GINA.  This law does not protect life insurance, disability insurance, or other types of insurance.   PLAN: After considering the risks, benefits, and limitations, Beth Peters  provided informed consent to pursue genetic testing and the blood sample was sent to [facility] for analysis of the Common Hereditary Cancers Panel. Results should be available within approximately 2-3 weeks' time, at which point they will be disclosed by telephone to Beth Peters, as will any additional recommendations warranted by these results. Beth Peters will receive a summary of her genetic counseling visit and a copy of her results once available. This information will also be available in Epic. We encouraged Beth Peters to remain in contact with cancer genetics annually so that we can continuously update the family history and inform her of any changes in cancer genetics and testing that may be of benefit for her family. Beth Peters's questions were answered to her satisfaction today. Our contact information was provided should additional questions or concerns arise.  Based on Beth Peters's family history, we recommended her siblings, nieces/nephew, and other relatives also, have genetic counseling and testing. Beth Peters will let us know if we can be of any assistance in coordinating genetic counseling and/or testing for this family member.   Lastly, we encouraged Beth Peters. Krahenbuhl to remain in contact with cancer genetics annually so that we can continuously update the family history and inform her of any changes in cancer genetics and testing that may be of benefit for this family.   Beth Peters.  Peters's questions were answered to her satisfaction today. Our contact information was provided should additional questions or concerns arise. Thank you for the referral and allowing [place] to share in the care of your patient.   Tana Felts, Beth Peters, [facility] Certified Genetic  Counselor [email or web address] phone: [phone]  The patient was seen for a total of 40 minutes in face-to-face genetic counseling.  This patient was discussed with Drs. Magrinat, Lindi Adie and/or Burr Medico who agrees with the above.

## 2018-01-14 DIAGNOSIS — M25661 Stiffness of right knee, not elsewhere classified: Secondary | ICD-10-CM | POA: Diagnosis not present

## 2018-01-14 DIAGNOSIS — M25561 Pain in right knee: Secondary | ICD-10-CM | POA: Diagnosis not present

## 2018-01-14 DIAGNOSIS — Z96651 Presence of right artificial knee joint: Secondary | ICD-10-CM | POA: Diagnosis not present

## 2018-01-14 DIAGNOSIS — R531 Weakness: Secondary | ICD-10-CM | POA: Diagnosis not present

## 2018-01-19 DIAGNOSIS — M25661 Stiffness of right knee, not elsewhere classified: Secondary | ICD-10-CM | POA: Diagnosis not present

## 2018-01-19 DIAGNOSIS — M25561 Pain in right knee: Secondary | ICD-10-CM | POA: Diagnosis not present

## 2018-01-19 DIAGNOSIS — Z96651 Presence of right artificial knee joint: Secondary | ICD-10-CM | POA: Diagnosis not present

## 2018-01-19 DIAGNOSIS — R531 Weakness: Secondary | ICD-10-CM | POA: Diagnosis not present

## 2018-01-21 ENCOUNTER — Other Ambulatory Visit: Payer: Self-pay | Admitting: Obstetrics and Gynecology

## 2018-01-21 MED ORDER — GABAPENTIN 300 MG PO CAPS: 300.0000 mg | ORAL_CAPSULE | Freq: Every day | ORAL | 0 refills | Status: DC

## 2018-01-21 NOTE — Telephone Encounter (Signed)
The patient is overdue for a f/u visit. Please schedule this. Please find out how many of the gabapentin she is taking at night and call in a one month supply. Will further make plans at her f/u visit.

## 2018-01-21 NOTE — Telephone Encounter (Signed)
Medication refill request: Gabapentin Last AEX:  11/20/17 with JJ Next AEX: none scheduled at this time.  Last MMG (if hormonal medication request): 05/05/13 Refill authorized:  Please advise.

## 2018-01-21 NOTE — Telephone Encounter (Signed)
Spoke with patient. Patient is taking Gabapentin 300 mg qhs. Advised she will need to return to the office for a recheck appointment. Patient is agreeable. Gabapentin 300 mg take 1 qhs #30 0RF sent to pharmacy on file. Patient will return call to schedule appointment tomorrow.

## 2018-01-25 ENCOUNTER — Other Ambulatory Visit: Payer: Self-pay | Admitting: Family Medicine

## 2018-01-25 DIAGNOSIS — R600 Localized edema: Secondary | ICD-10-CM

## 2018-01-27 DIAGNOSIS — M25661 Stiffness of right knee, not elsewhere classified: Secondary | ICD-10-CM | POA: Diagnosis not present

## 2018-01-27 DIAGNOSIS — Z96651 Presence of right artificial knee joint: Secondary | ICD-10-CM | POA: Diagnosis not present

## 2018-01-27 DIAGNOSIS — M25561 Pain in right knee: Secondary | ICD-10-CM | POA: Diagnosis not present

## 2018-01-27 DIAGNOSIS — R531 Weakness: Secondary | ICD-10-CM | POA: Diagnosis not present

## 2018-01-28 ENCOUNTER — Telehealth: Payer: Self-pay | Admitting: Genetics

## 2018-01-28 NOTE — Telephone Encounter (Signed)
Left message asking pt to call back to touch base about her genetic testing.

## 2018-01-28 NOTE — Telephone Encounter (Signed)
Informed patient that the laboratory is working on her genetic testing, and was able to complete part of the testing. However, they have been unable to extract a good enough sample that meets quality control for the 2nd portion of the test (del/dup).  They have requested a new sample be sent in so they can try again and repeat the del/dup analysis portion of the test.  She is scheduled to come in tomorrow morning 01/29/18 at 10:15 am for another blood draw.

## 2018-01-29 ENCOUNTER — Inpatient Hospital Stay: Payer: PPO

## 2018-02-03 ENCOUNTER — Encounter: Payer: Self-pay | Admitting: Family Medicine

## 2018-02-03 ENCOUNTER — Ambulatory Visit (INDEPENDENT_AMBULATORY_CARE_PROVIDER_SITE_OTHER): Payer: PPO | Admitting: Family Medicine

## 2018-02-03 VITALS — BP 136/84 | HR 77 | Temp 97.7°F | Ht 63.0 in | Wt 156.8 lb

## 2018-02-03 DIAGNOSIS — H6983 Other specified disorders of Eustachian tube, bilateral: Secondary | ICD-10-CM

## 2018-02-03 MED ORDER — AZITHROMYCIN 250 MG PO TABS: ORAL_TABLET | ORAL | 0 refills | Status: DC

## 2018-02-03 MED ORDER — FLUTICASONE PROPIONATE 50 MCG/ACT NA SUSP: 2.0000 | Freq: Every day | NASAL | 6 refills | Status: DC

## 2018-02-03 MED ORDER — FLUTICASONE-SALMETEROL 250-50 MCG/DOSE IN AEPB: 1.0000 | INHALATION_SPRAY | Freq: Two times a day (BID) | RESPIRATORY_TRACT | 2 refills | Status: DC

## 2018-02-03 NOTE — Progress Notes (Signed)
Beth Peters is a 67 y.o. female here for an acute visit.  History of Present Illness:   Shaune Pascal CMA acting as scribe for Dr. [name].  HPI: Patient comes in for an acute issue today. She has a scratchy throat and feels like her ears are stopped up. She noticed a couple weeks ago that when she hollered for the dog her vocal cords "done something weird". Denies any fever today. She was prescribed Zpak and Flonase today.   PMHx, SurgHx, SocialHx, Medications, and Allergies were reviewed in the Visit Navigator and updated as appropriate.  Current Medications:   Current Outpatient Medications:  .  aspirin EC 325 MG EC tablet, Take 1 tablet (325 mg total) by mouth daily with breakfast., Disp: 30 tablet, Rfl: 0 .  buPROPion (WELLBUTRIN XL) 150 MG 24 hr tablet, Take 1 tablet (150 mg total) by mouth daily. (Patient taking differently: Take 150 mg by mouth every morning. ), Disp: 90 tablet, Rfl: 3 .  CALCIUM PO, Take 15 mLs by mouth daily., Disp: , Rfl:  .  Cholecalciferol (VITAMIN D3) 1000 units CAPS, Take 1,000 Units by mouth daily., Disp: , Rfl:  .  denosumab (PROLIA) 60 MG/ML SOSY injection, Inject 60 mg into the skin every 6 (six) months., Disp: , Rfl:  .  docusate sodium (COLACE) 100 MG capsule, Take 100 mg by mouth 2 (two) times daily as needed for mild constipation., Disp: , Rfl:  .  esomeprazole (NEXIUM) 40 MG capsule, Take 40 mg by mouth 2 (two) times daily. Take 40 mg by mouth daily. May take additional 40 mg if needed for acid reflux, Disp: , Rfl:  .  Fluticasone-Salmeterol (ADVAIR DISKUS) 250-50 MCG/DOSE AEPB, Inhale 1 puff into the lungs 2 (two) times daily. , Disp: , Rfl:  .  gabapentin (NEURONTIN) 100 MG capsule, One tablet po q hs, may increase to 2 tablets po q hs after 3 days and to 3 tablets q hs after another 3 days if tolerated., Disp: 90 capsule, Rfl: 1 .  gabapentin (NEURONTIN) 300 MG capsule, Take 1 capsule (300 mg total) by mouth at bedtime., Disp: 30 capsule,  Rfl: 0 .  hydrochlorothiazide (MICROZIDE) 12.5 MG capsule, Take 1 capsule (12.5 mg total) by mouth every morning., Disp: 90 capsule, Rfl: 1 .  methocarbamol (ROBAXIN) 500 MG tablet, Take 1 tablet (500 mg total) by mouth every 6 (six) hours as needed for muscle spasms., Disp: 40 tablet, Rfl: 0 .  oxyCODONE (ROXICODONE) 5 MG/5ML solution, Take 5-10 mLs (5-10 mg total) by mouth every 4 (four) hours as needed for moderate pain., Disp: 240 mL, Rfl: 0 .  Polyethylene Glycol 3350 (MIRALAX PO), Take by mouth as needed., Disp: , Rfl:  .  raloxifene (EVISTA) 60 MG tablet, Take 1 tablet (60 mg total) by mouth daily. (Patient not taking: Reported on 11/20/2017), Disp: 90 tablet, Rfl: 0   Allergies  Allergen Reactions  . Dilaudid [Hydromorphone Hcl] Hives, Shortness Of Breath and Itching  . Glucagon Anaphylaxis  . Lactose Intolerance (Gi)   . Wasp Venom Itching and Swelling   Review of Systems:   Pertinent items are noted in the HPI. Otherwise, ROS is negative.  Vitals:   Vitals:   02/03/18 1028  BP: 136/84  Pulse: 77  Temp: 97.7 F (36.5 C)  TempSrc: Oral  SpO2: 99%  Weight: 156 lb 12.8 oz (71.1 kg)  Height: 5\' 3"  (1.6 m)     Body mass index is 27.78 kg/m.  Physical Exam:  Physical Exam  Constitutional: She appears well-nourished.  HENT:  Head: Normocephalic and atraumatic.  Nose: Right sinus exhibits maxillary sinus tenderness and frontal sinus tenderness. Left sinus exhibits maxillary sinus tenderness and frontal sinus tenderness.  Eyes: Pupils are equal, round, and reactive to light. EOM are normal.  Neck: Normal range of motion. Neck supple.  Cardiovascular: Normal rate, regular rhythm, normal heart sounds and intact distal pulses.  Pulmonary/Chest: Effort normal.  Abdominal: Soft.  Skin: Skin is warm.  Psychiatric: She has a normal mood and affect. Her behavior is normal.  Nursing note and vitals reviewed.    Assessment and Plan:   Beth Peters was seen today for sore  throat.  Diagnoses and all orders for this visit:  ETD (Eustachian tube dysfunction), bilateral, with sinusitis -     azithromycin (ZITHROMAX) 250 MG tablet; Take two tablets on first day then one until finished. -     fluticasone (FLONASE) 50 MCG/ACT nasal spray; Place 2 sprays into both nostrils daily. -     Fluticasone-Salmeterol (ADVAIR DISKUS) 250-50 MCG/DOSE AEPB; Inhale 1 puff into the lungs 2 (two) times daily.    . Reviewed expectations re: course of current medical issues. . Discussed self-management of symptoms. . Outlined signs and symptoms indicating need for more acute intervention. . Patient verbalized understanding and all questions were answered. Marland Kitchen Health Maintenance issues including appropriate healthy diet, exercise, and smoking avoidance were discussed with patient. . See orders for this visit as documented in the electronic medical record. . Patient received an After Visit Summary.  CMA served as Education administrator during this visit. History, Physical, and Plan performed by medical provider. The above documentation has been reviewed and is accurate and complete. Briscoe Deutscher, D.O.   Briscoe Deutscher, DO [place], [facility] 02/05/2018

## 2018-02-04 ENCOUNTER — Telehealth: Payer: Self-pay | Admitting: Genetics

## 2018-02-05 ENCOUNTER — Encounter: Payer: Self-pay | Admitting: Family Medicine

## 2018-02-06 DIAGNOSIS — Z96651 Presence of right artificial knee joint: Secondary | ICD-10-CM | POA: Diagnosis not present

## 2018-02-06 DIAGNOSIS — M25661 Stiffness of right knee, not elsewhere classified: Secondary | ICD-10-CM | POA: Diagnosis not present

## 2018-02-06 DIAGNOSIS — M25561 Pain in right knee: Secondary | ICD-10-CM | POA: Diagnosis not present

## 2018-02-06 DIAGNOSIS — R531 Weakness: Secondary | ICD-10-CM | POA: Diagnosis not present

## 2018-02-11 NOTE — Telephone Encounter (Signed)
Revealed negaitve genetic test results. Revealed VUS in PMS2.    This was the sequencing portion of the test, the lab required an additional sample to do the del/dup analysis and that is still pending.  I will call when this final result is available.

## 2018-02-13 ENCOUNTER — Other Ambulatory Visit: Payer: Self-pay | Admitting: Obstetrics and Gynecology

## 2018-02-13 NOTE — Telephone Encounter (Signed)
Medication refill request:Gabapentin 300mg  capsule Last AEX: 11/20/17  Next AEX: none schedule yet Last MMG (if hormonal medication request):  Refill authorized: Last refill 01/21/18, #30, refill--0. Please approve if more refills are appropriate

## 2018-02-16 ENCOUNTER — Telehealth: Payer: Self-pay | Admitting: Genetics

## 2018-02-17 DIAGNOSIS — Z96651 Presence of right artificial knee joint: Secondary | ICD-10-CM | POA: Diagnosis not present

## 2018-02-17 DIAGNOSIS — M25661 Stiffness of right knee, not elsewhere classified: Secondary | ICD-10-CM | POA: Diagnosis not present

## 2018-02-17 DIAGNOSIS — M25561 Pain in right knee: Secondary | ICD-10-CM | POA: Diagnosis not present

## 2018-02-17 DIAGNOSIS — R531 Weakness: Secondary | ICD-10-CM | POA: Diagnosis not present

## 2018-03-19 ENCOUNTER — Telehealth: Payer: Self-pay | Admitting: Genetics

## 2018-03-19 ENCOUNTER — Ambulatory Visit: Payer: Self-pay | Admitting: Genetics

## 2018-03-19 DIAGNOSIS — Z801 Family history of malignant neoplasm of trachea, bronchus and lung: Secondary | ICD-10-CM

## 2018-03-19 DIAGNOSIS — Z808 Family history of malignant neoplasm of other organs or systems: Secondary | ICD-10-CM

## 2018-03-19 DIAGNOSIS — Z1379 Encounter for other screening for genetic and chromosomal anomalies: Secondary | ICD-10-CM | POA: Insufficient documentation

## 2018-03-19 DIAGNOSIS — Z8042 Family history of malignant neoplasm of prostate: Secondary | ICD-10-CM

## 2018-03-19 DIAGNOSIS — Z8043 Family history of malignant neoplasm of testis: Secondary | ICD-10-CM

## 2018-03-19 DIAGNOSIS — Z8049 Family history of malignant neoplasm of other genital organs: Secondary | ICD-10-CM

## 2018-03-19 DIAGNOSIS — Z800 Family history of malignant neoplasm of digestive organs: Secondary | ICD-10-CM

## 2018-03-19 DIAGNOSIS — Z803 Family history of malignant neoplasm of breast: Secondary | ICD-10-CM

## 2018-03-19 DIAGNOSIS — Z8052 Family history of malignant neoplasm of bladder: Secondary | ICD-10-CM

## 2018-03-19 NOTE — Telephone Encounter (Signed)
asked pt to call back to discuss final genetic test result and our final recommendations.

## 2018-03-19 NOTE — Progress Notes (Signed)
HPI:  Ms. Cunanan was previously seen in the [facility] on 01/13/2018 due to a family history of cancer and concerns regarding a hereditary predisposition to cancer. Please refer to our prior cancer genetics clinic note for more information regarding Ms. Ralston's medical, social and family histories, and our assessment and recommendations, at the time. Ms. Saldierna's recent genetic test results were disclosed to her, as well as recommendations warranted by these results. These results and recommendations are discussed in more detail below.  CANCER HISTORY:   No history exists.     FAMILY HISTORY:  We obtained a detailed, 4-generation family history.  Significant diagnoses are listed below: Family History  Problem Relation Age of Onset  . Hypertension Father   . Heart disease Father   . Stroke Father   . Cancer Father        Testicular, Bladder, Prostate, Lung  . Heart disease Mother   . Stroke Mother   . Osteoporosis Mother   . Uterine cancer Sister 48  . Breast cancer Sister   . Cancer Sister        rectal, liver,lymph nodes  . Diabetes Maternal Grandfather   . Heart disease Maternal Grandfather   . Diabetes Paternal Grandmother   . Breast cancer Maternal Aunt     Ms. Dolby has a 53 year-old son and a 73 year-old daughter with no history of cancer.  Ms. Ozella Almond has 2 sisters and 1 brother: -1 sister was dx with uterine cancer at 65, she later developed breast cancer, and then died of rectal cancer at the age of 53.  Her sister never had any genetic testing. She has 1 daughter who is 13 with no history of cancer.  -1 sister is 15 with no history of cancer, she had a hysterectomy in her 70's. She has no children -1 brother is 41 with no history of cancer.  He has 2 daughters.    Ms. Prime's father: was dx with testicular cancer in his 12's, later had lung cancer, then dx with bladder cancer that recurred, and then had prostate cancer dx in his 70's.   He died in his 102's due to a stroke- his prostate cancer was never treated due to his comorbidities. These were all separate primary cancers (except for the bladder cancer recurrence). Paternal Aunts/Uncles: 1 paternal uncle 11 with no history of cancer, 1 paternal aunt died in her 66's with no history of cancer.  Paternal cousins: unk Paternal grandfather: hx of cancer, unk type Paternal grandmother:died of heart disease  Ms. Jardin's mother: died at 36, she had breast calcifications and large benign tumors in her uterus.  She had her uterus and ovaries removed in her 40's.  Maternal Aunts/Uncles: 3 maternal aunts, 1 maternal uncle: -1 maternal aunt died of breast cancer at 52 that was dx at 54.   -1 maternal uncle was dx with prostate cancer in his 54's,  -1 maternal aunt died of heart disease -1 maternal aunt is in her 18's with no history of cancer.  Maternal cousins: 1 maternal cousin died of brain cancer in her 18's.  Maternal grandfather: died at 76 due to heart attack Maternal grandmother:died in her 33's due to heart attack  Ms. Fikes is unaware of previous family history of genetic testing for hereditary cancer risks. Patient's maternal ancestors are of German/English descent, and paternal ancestors are of Swedish/Dutch descent. There is possibly some Ashkenazi Jewish ancestry 4-5 generations back. There is no known consanguinity.  GENETIC TEST RESULTS: Genetic testing performed through [facility]'s Common Hereditary Cancers Panel reported out on 02/08/2018 showed no pathogenic mutations. The Common Hereditary Cancer Panel offered by [facility] includes sequencing and/or deletion duplication testing of the following 47 genes: APC, ATM, AXIN2, BARD1, BMPR1A, BRCA1, BRCA2, BRIP1, CDH1, CDKN2A (p14ARF), CDKN2A (p16INK4a), CKD4, CHEK2, CTNNA1, DICER1, EPCAM (Deletion/duplication testing only), GREM1 (promoter region deletion/duplication testing only), KIT, MEN1, MLH1, MSH2, MSH3, MSH6, MUTYH,  NBN, NF1, NHTL1, PALB2, PDGFRA, PMS2, POLD1, POLE, PTEN, RAD50, RAD51C, RAD51D, SDHB, SDHC, SDHD, SMAD4, SMARCA4. STK11, TP53, TSC1, TSC2, and VHL.  The following genes were evaluated for sequence changes only: SDHA and HOXB13 c.251G>A variant only.   A variant of uncertain significance (VUS) in a gene called PMS2 was also noted. c.1438G>C (p.Gly480Arg)  The test report will be scanned into EPIC and will be located under the Molecular Pathology section of the Results Review tab. A portion of the result report is included below for reference.     We discussed with Ms. Morera that because current genetic testing is not perfect, it is possible there may be a gene mutation in one of these genes that current testing cannot detect, but that chance is small.  We also discussed, that there could be another gene that has not yet been discovered, or that we have not yet tested, that is responsible for the cancer diagnoses in the family. It is also possible there is a hereditary cause for the cancer in the family that Ms. Mccalip did not inherit and therefore was not identified in her testing.  Therefore, it is important to remain in touch with cancer genetics in the future so that we can continue to offer Ms. Monger the most up to date genetic testing.   Regarding the VUS in PMS2: At this time, it is unknown if this variant is associated with increased cancer risk or if this is a normal finding, but most variants such as this get reclassified to being inconsequential. It should not be used to make medical management decisions. With time, we suspect the lab will determine the significance of this variant, if any. If we do learn more about it, we will try to contact Ms. Hilbun to discuss it further. However, it is important to stay in touch with [place] periodically and keep the address and phone number up to date.  ADDITIONAL GENETIC TESTING: We discussed with Ms. Linders that there are other genes that are  associated with increased cancer risk that can be analyzed. The laboratories that offer this testing look at these additional genes via a hereditary cancer gene panel. Should Ms. Rotunno wish to pursue additional genetic testing, we are happy to discuss and coordinate this testing, at any time.    CANCER SCREENING RECOMMENDATIONS: Ms. Sison's test result is considered negative (normal).  This means that we have not identified a hereditary predisposition to cancer in her at this time.   While reassuring, this does not definitively rule out a hereditary predisposition to cancer. It is still possible that there could be genetic mutations that are undetectable by current technology, or genetic mutations in genes that have not been tested or identified to increase cancer risk.  Therefore, it is recommended she continue to follow the cancer management and screening guidelines provided by her oncology and primary healthcare provider. An individual's cancer risk is not determined by genetic test results alone.  Overall cancer risk assessment includes additional factors such as personal medical history, family history, etc.  These should  be used to make a personalized plan for cancer prevention and surveillance.    Based on the patient's personal and family history, the statistical model (Tyrer Cusik)   Was used to estimate her risk of developing breast cancer. This estimates her lifetime risk of developing breast cancer to be approximately 23.4%. The patient's lifetime breast cancer risk is a preliminary estimate based on available information using one of several models endorsed by the [place] (ACS). The ACS recommends consideration of breast MRI screening as an adjunct to mammography for patients at high risk (defined as 20% or greater lifetime risk). A more detailed breast cancer risk assessment can be considered, if clinically indicated.     Ms. Bohannon has been determined to be at high  risk for breast cancer. Therefore, we recommend that annual screening with mammography and breast MRI begin at age 79, or 10 years prior to the age of breast cancer diagnosis in a relative (whichever is earlier). We discussed that Ms. Janssen should discuss her individual situation with her referring physician and determine a breast cancer screening plan with which they are both comfortable.   RECOMMENDATIONS FOR FAMILY MEMBERS:  Relatives in this family might be at some increased risk of developing cancer, over the general population risk, simply due to the family history of cancer.  We recommended women in this family have a yearly mammogram beginning at age 24, or 49 years younger than the earliest onset of cancer, an annual clinical breast exam, and perform monthly breast self-exams. Women in this family should also have a gynecological exam as recommended by their primary provider. All family members should have a colonoscopy by age 68 (or as directed by their doctors).  All family members should inform their physicians about the family history of cancer so their doctors can make the most appropriate screening recommendations for them.   It is also possible there is a hereditary cause for the cancer in Ms. Cashion's family that she did not inherit and therefore was not identified in her.   Therefore, we recommended her siblings other relatives discuss if genetic counseling/ testing is appropriate with their doctors. Ms. Carley will let us know if we can be of any assistance in coordinating genetic counseling and/or testing for these family members.   FOLLOW-UP: Lastly, we discussed with Ms. Lowman that cancer genetics is a rapidly advancing field and it is possible that new genetic tests will be appropriate for her and/or her family members in the future. We encouraged her to remain in contact with cancer genetics on an annual basis so we can update her personal and family histories and let her  know of advances in cancer genetics that may benefit this family.   Our contact number was provided. Ms. Ellett's questions were answered to her satisfaction, and she knows she is welcome to call us at anytime with additional questions or concerns.   Ferol Luz, MS, [facility] Certified Genetic Counselor [email or web address]

## 2018-03-24 ENCOUNTER — Other Ambulatory Visit: Payer: Self-pay | Admitting: Obstetrics and Gynecology

## 2018-03-24 NOTE — Telephone Encounter (Signed)
Message left to return call to Emily at [phone].    

## 2018-03-25 ENCOUNTER — Encounter: Payer: Self-pay | Admitting: Genetics

## 2018-03-25 NOTE — Telephone Encounter (Signed)
Call to patient. Advised patient needed appointment to f/u on gabapentin. See refill encounter from 01-21-18. Patient agreeable to schedule. Patient scheduled for Tuesday 03-31-18 at 1415. Patient agreeable to date and time of appointment. Patient states she does not need a refill on gabapentin prior to appointment with Dr. Talbert Nan. Advised would refuse refill from pharmacy. Patient agreeable.   Routing to provider for final review. Patient agreeable to disposition. Will close encounter.

## 2018-03-27 ENCOUNTER — Other Ambulatory Visit: Payer: Self-pay | Admitting: Obstetrics and Gynecology

## 2018-03-27 NOTE — Telephone Encounter (Signed)
Medication refill request: Gabapentin Last AEX:  11/20/2017 Next AEX: not scheduled Last MMG (if hormonal medication request): 2014 BI-RADS CATEGORY 1:  Negative. Refill authorized: #90, 0 refills please advise.

## 2018-03-30 NOTE — Progress Notes (Addendum)
GYNECOLOGY  VISIT   HPI: 67 y.o.   Married White or Caucasian Not Hispanic or Latino  female   [phone] with Patient's last menstrual period was 09/23/2012.   here for medication management.  The patient was started on gabapentin for vasomotor symptoms at the time of her annual exam in 5/19. The gabapentin isn't working very well for her. Initially she thought it was working, but she was post op and on narcotics and muscle relaxants.  She is having terrible hot flashes during the day, 1 x every 1-2 hours. Up 4 x a night with night sweats. Many triggers. Not tolerable.  She was recently seen for a genetic consult, negative genetic testing, but elevated risk of breast cancer of 23.4%.  She has since had revision of her total knee in 5/19. Recovered, she is now 5 mm taller on the right.  She has osteopenia, has a script for Evista. Will start it soon, was waiting for the risk of blood clot from her knee surgery goes done. She tried Effexor previously for depression and ? Vasomotor symptoms. Doesn't remember how she did on it.   Her daughter is having a leep and she has questions about that.    GYNECOLOGIC HISTORY: Patient's last menstrual period was 09/23/2012. Contraception:post menopausal Menopausal hormone therapy: none        OB History    Gravida  2   Para  2   Term  2   Preterm      AB      Living  2     SAB      TAB      Ectopic      Multiple      Live Births  2              Patient Active Problem List   Diagnosis Date Noted  . Genetic testing 03/19/2018  . Family history of uterine cancer   . Family history of breast cancer   . Family history of prostate cancer   . Family history of testicular cancer   . Family history of rectal cancer   . Family history of bladder cancer   . Family history of brain cancer   . Family history of lung cancer   . 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 Osteopenia   . Achalasia of esophagus 10/28/2017  .  Thoracic spondylosis 10/14/2017  . Chronic thoracic back pain 08/12/2017  . Chronic neck pain 08/12/2017  . 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  . DDD (degenerative disc disease) 10/16/2012  . Scoliosis 10/16/2012  . Rectocele without mention of uterine prolapse 10/14/2012  . Dysphagia, unspecified(787.20) 10/14/2012  . Rheumatoid arthritis ([place]) 10/07/2012  . Arthralgia 10/07/2012  . OA (osteoarthritis) 10/07/2012  . Eczema 08/08/2011  . Hyperlipidemia 07/24/2011  . Esophageal reflux 05/24/2009  . Allergic rhinitis 05/24/2009  . Lesion of ulnar nerve 05/24/2009  . Essential hypertension 02/27/2009  . Asthma 02/27/2009    Past Medical History:  Diagnosis Date  . Achalasia of esophagus    s/p  heller myotomy hiatal hernia repair 2006  and x3  dilatation's  . Anemia   . Asthma   . Chronic back pain    lumbar and thoracic  . Chronic constipation   . Cubital tunnel syndrome, bilateral   . Degenerative arthritis of spine   . Esophageal dysmotility    ultrasound 11-15-2011  . Family history of adverse reaction to anesthesia    sister and mother--  ponv  . Family history of bladder cancer   . Family history of brain cancer   . Family history of breast cancer   . Family history of lung cancer   . Family history of prostate cancer   . Family history of rectal cancer   . Family history of testicular cancer   . Family history of uterine cancer   . Gastrointestinal dysmotility   . GERD (gastroesophageal reflux disease)   . Hand tingling    fingers bilateral hand due to bilateral cubital tunnel syndrome  . History of esophageal spasm    s/p  esophageal dilation  . History of hiatal hernia    s/p  repair 2006  . Hypercholesterolemia 2009  . Hypertension   . Lactose intolerance   . 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 OA (osteoarthritis)   . Osteopenia   . Panic disorder   . Polyarthralgia   . PONV (postoperative nausea and vomiting)   . Raynauds syndrome    . Scoliosis of thoracic spine 8/01  . Seasonal affective disorder ([place])   . SUI (stress urinary incontinence, female)   . Superficial thrombophlebitis    x2  left lower leg superficial in  2006  prior to vein stripping     Past Surgical History:  Procedure Laterality Date  . ABDOMINAL SURGERY    . D & C HYSTEROSCOPY W/ RESECTION FIBROID  09-21-2002  dr Mohammed Kindle  [place] W/ RESECTION POLYP  12-24-2010  dr Joan Flores  [facility]  . DILATION AND CURETTAGE OF UTERUS    . ESOPHAGEAL DILATION  2013  --- [facility]   Omega dilation for Achalasia  . ESOPHAGOGASTRODUODENOSCOPY N/A 09/20/2017   Procedure: ESOPHAGOGASTRODUODENOSCOPY (EGD);  Surgeon: Clarene Essex, MD;  Location: [facility];  Service: Endoscopy;  Laterality: N/A;  . HELLER MYOTOMY  0623   fundoplication repair hiatal hernia  . KNEE ARTHROSCOPY Left 2006 &  09-27-2003  . KNEE ARTHROSCOPY W/ SYNOVECTOMY Right 09-20-2009  dr Wynelle Link  [facility]  . KNEE SURGERY Right 1962  . ROTATOR CUFF REPAIR Right 09/2011  . TONSILLECTOMY AND ADENOIDECTOMY  1961  . TOTAL KNEE ARTHROPLASTY Bilateral left 02-05-2006 ;  right 08-14-2007   dr Wynelle Link  [facility]  . TOTAL KNEE REVISION Right 11/26/2017   Procedure: Right knee polyethylene revision;  Surgeon: Gaynelle Arabian, MD;  Location: [facility];  Service: Orthopedics;  Laterality: Right;  Adductor Block  . VARICOSE VEIN SURGERY  09/2005    Current Outpatient Medications  Medication Sig Dispense Refill  . buPROPion (WELLBUTRIN XL) 150 MG 24 hr tablet Take 1 tablet (150 mg total) by mouth daily. (Patient taking differently: Take 150 mg by mouth every morning. ) 90 tablet 3  . CALCIUM PO Take 15 mLs by mouth daily.    . Cholecalciferol (VITAMIN D3) 1000 units CAPS Take 1,000 Units by mouth daily.    Marland Kitchen docusate sodium (COLACE) 100 MG capsule Take 100 mg by mouth 2 (two) times daily as needed for mild constipation.    Marland Kitchen esomeprazole (NEXIUM) 40 MG capsule Take 40 mg by mouth 2 (two) times daily. Take 40 mg by mouth  daily. May take additional 40 mg if needed for acid reflux    . fluticasone (FLONASE) 50 MCG/ACT nasal spray Place 2 sprays into both nostrils daily. 16 g 6  . Fluticasone-Salmeterol (ADVAIR DISKUS) 250-50 MCG/DOSE AEPB Inhale 1 puff into the lungs 2 (two) times daily. 60 each 2  . gabapentin (NEURONTIN) 300 MG  capsule TAKE 1 CAPSULE BY MOUTH EVERYDAY AT BEDTIME 30 capsule 0  . hydrochlorothiazide (MICROZIDE) 12.5 MG capsule Take 1 capsule (12.5 mg total) by mouth every morning. 90 capsule 1  . Polyethylene Glycol 3350 (MIRALAX PO) Take by mouth as needed.    . raloxifene (EVISTA) 60 MG tablet Take 1 tablet (60 mg total) by mouth daily. 90 tablet 0   No current facility-administered medications for this visit.      ALLERGIES: Dilaudid [hydromorphone hcl]; Glucagon; Lactose intolerance (gi); and Wasp venom  Family History  Problem Relation Age of Onset  . Hypertension Father   . Heart disease Father   . Stroke Father   . Cancer Father        Testicular, Bladder, Prostate, Lung  . Heart disease Mother   . Stroke Mother   . Osteoporosis Mother   . Uterine cancer Sister 41  . Breast cancer Sister   . Cancer Sister        rectal, liver,lymph nodes  . Diabetes Maternal Grandfather   . Heart disease Maternal Grandfather   . Diabetes Paternal Grandmother   . Breast cancer Maternal Aunt     Social History   Socioeconomic History  . Marital status: Married    Spouse name: Not on file  . Number of children: Not on file  . Years of education: Not on file  . Highest education level: Not on file  Occupational History  . Not on file  Social Needs  . Financial resource strain: Not on file  . Food insecurity:    Worry: Not on file    Inability: Not on file  . Transportation needs:    Medical: Not on file    Non-medical: Not on file  Tobacco Use  . Smoking status: Never Smoker  . Smokeless tobacco: Never Used  Substance and Sexual Activity  . Alcohol use: Yes    Alcohol/week:  6.0 - 8.0 standard drinks    Types: 6 - 8 Glasses of wine per week  . Drug use: No  . Sexual activity: Yes    Partners: Male    Birth control/protection: Post-menopausal    Comment: vasectomy  Lifestyle  . Physical activity:    Days per week: Not on file    Minutes per session: Not on file  . Stress: Not on file  Relationships  . Social connections:    Talks on phone: Not on file    Gets together: Not on file    Attends religious service: Not on file    Active member of club or organization: Not on file    Attends meetings of clubs or organizations: Not on file    Relationship status: Not on file  . Intimate partner violence:    Fear of current or ex partner: Not on file    Emotionally abused: Not on file    Physically abused: Not on file    Forced sexual activity: Not on file  Other Topics Concern  . Not on file  Social History Narrative  . Not on file    Review of Systems  Constitutional: Negative.   HENT:       Sinusitis  Eyes: Negative.   Respiratory: Negative.   Cardiovascular: Negative.   Gastrointestinal: Positive for constipation.       Bloating  Endocrine: Negative.   Genitourinary:       Nocturia  Musculoskeletal: Positive for arthralgias, joint swelling and myalgias.  Skin:       Hair  loss  Allergic/Immunologic: Negative.   Neurological: Negative.   Hematological: Negative.   Psychiatric/Behavioral: Negative.   All other systems reviewed and are negative.   PHYSICAL EXAMINATION:    LMP 09/23/2012     General appearance: alert, cooperative and appears stated age  ASSESSMENT Vasomotor symptoms, severe, not helped with 300 mg of nightly gabapentin Elevated risk of breast cancer (23.4%)    PLAN Discussed options for treating her vasomotor symptoms. I'm hesitant to prescribe HRT given her elevated risk of breast cancer. We discussed the option of increasing the dose of gabapentin or trying Effexor Will increase the dose of Effexor by 100 mg at  night and then increase every 3 days until she is taking the 100 mg TID and the 300 just at night F/U in one month She will schedule her 3D mammogram now, will order breast MRI in 6 months   An After Visit Summary was printed and given to the patient.  Over 25 minutes face to face time of which over 50% was spent in counseling.   Addendum: In the plan the Effexor is actually Gabapentin

## 2018-03-31 ENCOUNTER — Ambulatory Visit (INDEPENDENT_AMBULATORY_CARE_PROVIDER_SITE_OTHER): Payer: PPO | Admitting: Obstetrics and Gynecology

## 2018-03-31 ENCOUNTER — Encounter: Payer: Self-pay | Admitting: Obstetrics and Gynecology

## 2018-03-31 ENCOUNTER — Other Ambulatory Visit: Payer: Self-pay

## 2018-03-31 VITALS — BP 152/90 | HR 71 | Ht 63.0 in | Wt 161.0 lb

## 2018-03-31 DIAGNOSIS — N951 Menopausal and female climacteric states: Secondary | ICD-10-CM | POA: Diagnosis not present

## 2018-03-31 DIAGNOSIS — Z9189 Other specified personal risk factors, not elsewhere classified: Secondary | ICD-10-CM | POA: Diagnosis not present

## 2018-03-31 MED ORDER — GABAPENTIN 100 MG PO CAPS: ORAL_CAPSULE | ORAL | 1 refills | Status: DC

## 2018-03-31 MED ORDER — GABAPENTIN 300 MG PO CAPS: ORAL_CAPSULE | ORAL | 0 refills | Status: DC

## 2018-03-31 NOTE — Patient Instructions (Signed)
Mammogram Facilities  Yearly screening mammograms are recommended for women beginning at age 66. For a routine screening mammogram, you may schedule the appointment and have it done at the location of your choice.  Please ask the facility to send the results to our office. (fax [phone]) Location options include:  * [facility] [address], [place], [place] [address] [phone]  [facility] [address], [place], [place] [address] [phone]

## 2018-04-03 ENCOUNTER — Other Ambulatory Visit: Payer: Self-pay | Admitting: *Deleted

## 2018-04-03 DIAGNOSIS — Z1231 Encounter for screening mammogram for malignant neoplasm of breast: Secondary | ICD-10-CM

## 2018-04-23 ENCOUNTER — Other Ambulatory Visit: Payer: Self-pay | Admitting: Obstetrics and Gynecology

## 2018-04-23 DIAGNOSIS — Z1231 Encounter for screening mammogram for malignant neoplasm of breast: Secondary | ICD-10-CM

## 2018-04-26 ENCOUNTER — Other Ambulatory Visit: Payer: Self-pay | Admitting: Family Medicine

## 2018-04-26 DIAGNOSIS — H6983 Other specified disorders of Eustachian tube, bilateral: Secondary | ICD-10-CM

## 2018-04-27 NOTE — Telephone Encounter (Signed)
Asked pt to call back to go over final results of genetics.

## 2018-05-03 ENCOUNTER — Other Ambulatory Visit: Payer: Self-pay | Admitting: Family Medicine

## 2018-05-06 ENCOUNTER — Ambulatory Visit (INDEPENDENT_AMBULATORY_CARE_PROVIDER_SITE_OTHER): Payer: PPO

## 2018-05-06 ENCOUNTER — Ambulatory Visit (INDEPENDENT_AMBULATORY_CARE_PROVIDER_SITE_OTHER): Payer: PPO | Admitting: Family Medicine

## 2018-05-06 ENCOUNTER — Encounter: Payer: Self-pay | Admitting: Family Medicine

## 2018-05-06 VITALS — BP 132/84 | HR 66 | Temp 98.1°F | Ht 63.0 in | Wt 166.6 lb

## 2018-05-06 DIAGNOSIS — K219 Gastro-esophageal reflux disease without esophagitis: Secondary | ICD-10-CM

## 2018-05-06 DIAGNOSIS — Z23 Encounter for immunization: Secondary | ICD-10-CM | POA: Diagnosis not present

## 2018-05-06 DIAGNOSIS — Z9189 Other specified personal risk factors, not elsewhere classified: Secondary | ICD-10-CM | POA: Diagnosis not present

## 2018-05-06 DIAGNOSIS — R140 Abdominal distension (gaseous): Secondary | ICD-10-CM | POA: Diagnosis not present

## 2018-05-06 DIAGNOSIS — M069 Rheumatoid arthritis, unspecified: Secondary | ICD-10-CM | POA: Diagnosis not present

## 2018-05-06 DIAGNOSIS — N951 Menopausal and female climacteric states: Secondary | ICD-10-CM

## 2018-05-06 DIAGNOSIS — R1031 Right lower quadrant pain: Secondary | ICD-10-CM

## 2018-05-06 DIAGNOSIS — K220 Achalasia of cardia: Secondary | ICD-10-CM

## 2018-05-06 LAB — COMPREHENSIVE METABOLIC PANEL
ALT: 20 U/L (ref 0–35)
AST: 17 U/L (ref 0–37)
Albumin: 4.3 g/dL (ref 3.5–5.2)
Alkaline Phosphatase: 72 U/L (ref 39–117)
BUN: 14 mg/dL (ref 6–23)
CO2: 32 mEq/L (ref 19–32)
Calcium: 9.7 mg/dL (ref 8.4–10.5)
Chloride: 101 mEq/L (ref 96–112)
Creatinine, Ser: 0.86 mg/dL (ref 0.40–1.20)
GFR: 70.16 mL/min (ref >60.00)
Glucose, Bld: 90 mg/dL (ref 70–99)
Potassium: 4 mEq/L (ref 3.5–5.1)
Sodium: 140 mEq/L (ref 135–145)
Total Bilirubin: 0.4 mg/dL (ref 0.2–1.2)
Total Protein: 6.8 g/dL (ref 6.0–8.3)

## 2018-05-06 LAB — CBC WITH DIFFERENTIAL/PLATELET
Basophils Absolute: 0.1 10*3/uL (ref 0.0–0.1)
Basophils Relative: 1 % (ref 0.0–3.0)
Eosinophils Absolute: 0.2 10*3/uL (ref 0.0–0.7)
Eosinophils Relative: 3.3 % (ref 0.0–5.0)
HCT: 42.3 % (ref 36.0–46.0)
Hemoglobin: 14.4 g/dL (ref 12.0–15.0)
Lymphocytes Relative: 36.7 % (ref 12.0–46.0)
Lymphs Abs: 2.3 10*3/uL (ref 0.7–4.0)
MCHC: 34.1 g/dL (ref 30.0–36.0)
MCV: 91.8 fl (ref 78.0–100.0)
Monocytes Absolute: 0.4 10*3/uL (ref 0.1–1.0)
Monocytes Relative: 5.8 % (ref 3.0–12.0)
Neutro Abs: 3.4 10*3/uL (ref 1.4–7.7)
Neutrophils Relative %: 53.2 % (ref 43.0–77.0)
Platelets: 205 10*3/uL (ref 150.0–400.0)
RBC: 4.61 Mil/uL (ref 3.87–5.11)
RDW: 13.7 % (ref 11.5–15.5)
WBC: 6.3 10*3/uL (ref 4.0–10.5)

## 2018-05-06 LAB — LIPASE: Lipase: 53 U/L (ref 11.0–59.0)

## 2018-05-06 MED ORDER — PNEUMOCOCCAL 13-VAL CONJ VACC IM SUSP: 0.5000 mL | INTRAMUSCULAR | Status: DC

## 2018-05-06 MED ORDER — VENLAFAXINE HCL ER 37.5 MG PO CP24: 37.5000 mg | ORAL_CAPSULE | Freq: Every day | ORAL | 0 refills | Status: DC

## 2018-05-06 NOTE — Progress Notes (Signed)
Beth Peters is a 67 y.o. female is here for follow up.  History of Present Illness:   HPI: Weight up 5 pounds. 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 Genetics - no Lynch Syndrome. 23% risk of breast cancer. 3D mammo alternating with MRI q 6 months. Cough still an issue. She wonders if it due to allergies or GERD. Hx of achalaisa. Currently taking Nexium 40 mg daily but has needed it BID in the past. No fever, chills, CP, SOB, edema.   Health Maintenance Due  Topic Date Due  . Hepatitis C Screening  [birth year]  . COLONOSCOPY  08/15/2013  . TETANUS/TDAP  07/16/2015   Depression screen PHQ 2/9 06/02/2017  Decreased Interest 0  Down, Depressed, Hopeless 0  PHQ - 2 Score 0   PMHx, SurgHx, SocialHx, FamHx, Medications, and Allergies were reviewed in the Visit Navigator and updated as appropriate.   Patient Active Problem List   Diagnosis Date Noted  . Genetic testing 03/19/2018  . Family history of uterine cancer   . Family history of breast cancer   . Family history of prostate cancer   . Family history of testicular cancer   . Family history of rectal cancer   . Family history of bladder cancer   . Family history of brain cancer   . Family history of lung cancer   . 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 Osteopenia   . Achalasia of esophagus 10/28/2017  . Thoracic spondylosis 10/14/2017  . Chronic thoracic back pain 08/12/2017  . Chronic neck pain 08/12/2017  . 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  . DDD (degenerative disc disease) 10/16/2012  . Scoliosis 10/16/2012  . Rectocele without mention of uterine prolapse 10/14/2012  . Dysphagia, unspecified(787.20) 10/14/2012  . Rheumatoid arthritis ([place]) 10/07/2012  . Arthralgia 10/07/2012  . OA (osteoarthritis) 10/07/2012  . Eczema 08/08/2011  . Hyperlipidemia 07/24/2011  . Esophageal reflux 05/24/2009  . Allergic rhinitis 05/24/2009  . Lesion of ulnar nerve  05/24/2009  . Essential hypertension 02/27/2009  . Asthma 02/27/2009   Social History   Tobacco Use  . Smoking status: Never Smoker  . Smokeless tobacco: Never Used  Substance Use Topics  . Alcohol use: Yes    Alcohol/week: 6.0 - 8.0 standard drinks    Types: 6 - 8 Glasses of wine per week  . Drug use: No   Current Medications and Allergies:   Current Outpatient Medications:  .  ADVAIR DISKUS 250-50 MCG/DOSE AEPB, TAKE 1 PUFF BY MOUTH TWICE A DAY, Disp: 180 each, Rfl: 1 .  buPROPion (WELLBUTRIN XL) 150 MG 24 hr tablet, TAKE 1 TABLET BY MOUTH EVERY DAY, Disp: 90 tablet, Rfl: 0 .  CALCIUM PO, Take 15 mLs by mouth daily., Disp: , Rfl:  .  Cholecalciferol (VITAMIN D3) 1000 units CAPS, Take 1,000 Units by mouth daily., Disp: , Rfl:  .  docusate sodium (COLACE) 100 MG capsule, Take 100 mg by mouth 2 (two) times daily as needed for mild constipation., Disp: , Rfl:  .  esomeprazole (NEXIUM) 40 MG capsule, Take 40 mg by mouth 2 (two) times daily. Take 40 mg by mouth daily. May take additional 40 mg if needed for acid reflux, Disp: , Rfl:  .  fluticasone (FLONASE) 50 MCG/ACT nasal spray, Place 2 sprays into both nostrils daily., Disp: 16 g, Rfl: 6 .  gabapentin (NEURONTIN) 100 MG capsule, Take one tablet q hs( with the 300 mg tablet), if tolerating in  3 days increase to 100 mg bid, if tolerating in 3 days increase to 100 mg TID, Disp: 90 capsule, Rfl: 1 .  gabapentin (NEURONTIN) 300 MG capsule, TAKE 1 CAPSULE BY MOUTH EVERYDAY AT BEDTIME, Disp: 90 capsule, Rfl: 0 .  hydrochlorothiazide (MICROZIDE) 12.5 MG capsule, Take 1 capsule (12.5 mg total) by mouth every morning., Disp: 90 capsule, Rfl: 1 .  Polyethylene Glycol 3350 (MIRALAX PO), Take by mouth as needed., Disp: , Rfl:  .  raloxifene (EVISTA) 60 MG tablet, Take 1 tablet (60 mg total) by mouth daily., Disp: 90 tablet, Rfl: 0   Allergies  Allergen Reactions  . Dilaudid [Hydromorphone Hcl] Hives, Shortness Of Breath and Itching  . Glucagon  Anaphylaxis  . Lactose Intolerance (Gi)   . Wasp Venom Itching and Swelling   Review of Systems   Pertinent items are noted in the HPI. Otherwise, ROS is negative.  Vitals:   Vitals:   05/06/18 1324  BP: 132/84  Pulse: 66  Temp: 98.1 F (36.7 C)  TempSrc: Oral  SpO2: 99%  Weight: 166 lb 9.6 oz (75.6 kg)  Height: 5\' 3"  (1.6 m)     Body mass index is 29.51 kg/m.  Physical Exam:   Physical Exam  Constitutional: She appears well-nourished.  HENT:  Head: Normocephalic and atraumatic.  Eyes: Pupils are equal, round, and reactive to light. EOM are normal.  Neck: Normal range of motion. Neck supple.  Cardiovascular: Normal rate, regular rhythm, normal heart sounds and intact distal pulses.  Pulmonary/Chest: Effort normal.  Abdominal: Soft. There is tenderness in the right lower quadrant and epigastric area.  Skin: Skin is warm.  Psychiatric: She has a normal mood and affect. Her behavior is normal.  Nursing note and vitals reviewed.  Results for orders placed or performed in visit on 05/06/18  CBC with Differential/Platelet  Result Value Ref Range   WBC 6.3 4.0 - 10.5 K/uL   RBC 4.61 3.87 - 5.11 Mil/uL   Hemoglobin 14.4 12.0 - 15.0 g/dL   HCT 42.3 36.0 - 46.0 %   MCV 91.8 78.0 - 100.0 fl   MCHC 34.1 30.0 - 36.0 g/dL   RDW 13.7 11.5 - 15.5 %   Platelets 205.0 150.0 - 400.0 K/uL   Neutrophils Relative % 53.2 43.0 - 77.0 %   Lymphocytes Relative 36.7 12.0 - 46.0 %   Monocytes Relative 5.8 3.0 - 12.0 %   Eosinophils Relative 3.3 0.0 - 5.0 %   Basophils Relative 1.0 0.0 - 3.0 %   Neutro Abs 3.4 1.4 - 7.7 K/uL   Lymphs Abs 2.3 0.7 - 4.0 K/uL   Monocytes Absolute 0.4 0.1 - 1.0 K/uL   Eosinophils Absolute 0.2 0.0 - 0.7 K/uL   Basophils Absolute 0.1 0.0 - 0.1 K/uL  Comprehensive metabolic panel  Result Value Ref Range   Sodium 140 135 - 145 mEq/L   Potassium 4.0 3.5 - 5.1 mEq/L   Chloride 101 96 - 112 mEq/L   CO2 32 19 - 32 mEq/L   Glucose, Bld 90 70 - 99 mg/dL   BUN  14 6 - 23 mg/dL   Creatinine, Ser 0.86 0.40 - 1.20 mg/dL   Total Bilirubin 0.4 0.2 - 1.2 mg/dL   Alkaline Phosphatase 72 39 - 117 U/L   AST 17 0 - 37 U/L   ALT 20 0 - 35 U/L   Total Protein 6.8 6.0 - 8.3 g/dL   Albumin 4.3 3.5 - 5.2 g/dL   Calcium 9.7 8.4 -  10.5 mg/dL   GFR 70.16 >60.00 mL/min  Lipase  Result Value Ref Range   Lipase 53.0 11.0 - 59.0 U/L   Assessment and Plan:   Beth Peters was seen today for follow-up.  Diagnoses and all orders for this visit:  Abdominal bloating -     CBC with Differential/Platelet -     Comprehensive metabolic panel -     Lipase -     CT Abdomen Pelvis W Contrast  Abdominal distension (gaseous) -     CBC with Differential/Platelet -     Comprehensive metabolic panel -     Lipase -     CT Abdomen Pelvis W Contrast  Achalasia of esophagus  Gastroesophageal reflux disease, esophagitis presence not specified  Menopausal vasomotor syndrome -     venlafaxine XR (EFFEXOR XR) 37.5 MG 24 hr capsule; Take 1 capsule (37.5 mg total) by mouth daily with breakfast.  Rheumatoid arthritis, involving unspecified site, unspecified rheumatoid factor presence (HCC)  Increased risk of breast cancer, 23.4%  Right lower quadrant abdominal pain -     CBC with Differential/Platelet -     Comprehensive metabolic panel -     Lipase -     CT Abdomen Pelvis W Contrast   . Reviewed expectations re: course of current medical issues. . Discussed self-management of symptoms. . Outlined signs and symptoms indicating need for more acute intervention. . Patient verbalized understanding and all questions were answered. Marland Kitchen Health Maintenance issues including appropriate healthy diet, exercise, and smoking avoidance were discussed with patient. . See orders for this visit as documented in the electronic medical record. . Patient received an After Visit Summary.  Briscoe Deutscher, DO [place], [facility] 05/10/2018

## 2018-05-10 ENCOUNTER — Encounter: Payer: Self-pay | Admitting: Family Medicine

## 2018-05-10 DIAGNOSIS — R140 Abdominal distension (gaseous): Secondary | ICD-10-CM | POA: Insufficient documentation

## 2018-05-10 DIAGNOSIS — N951 Menopausal and female climacteric states: Secondary | ICD-10-CM | POA: Insufficient documentation

## 2018-05-10 DIAGNOSIS — Z9189 Other specified personal risk factors, not elsewhere classified: Secondary | ICD-10-CM | POA: Insufficient documentation

## 2018-05-14 DIAGNOSIS — M7061 Trochanteric bursitis, right hip: Secondary | ICD-10-CM | POA: Diagnosis not present

## 2018-05-21 ENCOUNTER — Other Ambulatory Visit: Payer: Self-pay | Admitting: Obstetrics and Gynecology

## 2018-05-21 ENCOUNTER — Ambulatory Visit (INDEPENDENT_AMBULATORY_CARE_PROVIDER_SITE_OTHER)
Admission: RE | Admit: 2018-05-21 | Discharge: 2018-05-21 | Disposition: A | Discharge status: Home / Self Care | Payer: PPO | Source: Ambulatory Visit | Attending: Family Medicine | Admitting: Family Medicine

## 2018-05-21 DIAGNOSIS — R1031 Right lower quadrant pain: Secondary | ICD-10-CM

## 2018-05-21 DIAGNOSIS — R140 Abdominal distension (gaseous): Secondary | ICD-10-CM | POA: Diagnosis not present

## 2018-05-21 MED ORDER — IOPAMIDOL (ISOVUE-300) INJECTION 61%
100.0000 mL | Freq: Once | INTRAVENOUS | Status: AC | PRN
  Administered 2018-05-21: 100 mL via INTRAVENOUS

## 2018-05-22 ENCOUNTER — Telehealth: Payer: Self-pay | Admitting: Emergency Medicine

## 2018-05-22 ENCOUNTER — Telehealth: Payer: Self-pay | Admitting: Obstetrics and Gynecology

## 2018-05-22 DIAGNOSIS — N949 Unspecified condition associated with female genital organs and menstrual cycle: Secondary | ICD-10-CM

## 2018-05-22 DIAGNOSIS — M25561 Pain in right knee: Secondary | ICD-10-CM | POA: Diagnosis not present

## 2018-05-22 NOTE — Telephone Encounter (Signed)
Patient returned call to [facility].

## 2018-05-22 NOTE — Telephone Encounter (Signed)
Call placed to convey benefits. 

## 2018-05-22 NOTE — Telephone Encounter (Signed)
Message from Dr. Alcario Drought office.  Patient with recent CT/ABD RLQ pain and bloating. .L Ovary enlarged from priors.    Result:  Enlarging 2.5 cm low-density lesion in the left ovary. Pelvic ultrasound recommended for further characterization.  Ultrasound ordered and consult for follow up scheduled for 05/26/18 at 1300 with 1330 consult.   Will be notified by Dr. Alcario Drought office of appointments.  Reviewed notes and patient aware of appointments.   Encounter to Dr. Talbert Nan and will close.

## 2018-05-25 DIAGNOSIS — M25561 Pain in right knee: Secondary | ICD-10-CM | POA: Diagnosis not present

## 2018-05-25 NOTE — Progress Notes (Signed)
GYNECOLOGY  VISIT   HPI: 67 y.o.   Married White or Caucasian Not Hispanic or Latino  female   [phone] with Patient's last menstrual period was 09/23/2012.   here for consult following PUS.   The patient was noted to have a 2.5 cm low density area in the left ovary at the time of CT exam last week done for bloating/distention. The lesion was 2 cm in 2014 (not mentioned in the report, but seen on imaging).  No enlarged pelvic nodes or free fluid on recent CT.   Recently started on Effexor, that plus the Gabapentin have made a huge difference in her vasomotor symptoms.    GYNECOLOGIC HISTORY: Patient's last menstrual period was 09/23/2012. Contraception: Spouse has a vasectomy, postmenoapausal Menopausal hormone therapy: None        OB History    Gravida  2   Para  2   Term  2   Preterm      AB      Living  2     SAB      TAB      Ectopic      Multiple      Live Births  2              Patient Active Problem List   Diagnosis Date Noted  . Increased risk of breast cancer, 23.4% 05/10/2018  . Menopausal vasomotor syndrome 05/10/2018  . Abdominal bloating 05/10/2018  . Genetic testing 03/19/2018  . Family history of uterine cancer   . Family history of breast cancer   . Family history of prostate cancer   . Family history of testicular cancer   . Family history of rectal cancer   . Family history of bladder cancer   . Family history of brain cancer   . Family history of lung cancer   . 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 Osteopenia   . Achalasia of esophagus 10/28/2017  . Thoracic spondylosis 10/14/2017  . Chronic thoracic back pain 08/12/2017  . Chronic neck pain 08/12/2017  . 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  . DDD (degenerative disc disease) 10/16/2012  . Scoliosis 10/16/2012  . Rectocele without mention of uterine prolapse 10/14/2012  . Dysphagia, unspecified(787.20) 10/14/2012  . Rheumatoid arthritis ([place])  10/07/2012  . Arthralgia 10/07/2012  . OA (osteoarthritis) 10/07/2012  . Eczema 08/08/2011  . Hyperlipidemia 07/24/2011  . Esophageal reflux 05/24/2009  . Allergic rhinitis 05/24/2009  . Lesion of ulnar nerve 05/24/2009  . Essential hypertension 02/27/2009  . Asthma 02/27/2009    Past Medical History:  Diagnosis Date  . Achalasia of esophagus    s/p  heller myotomy hiatal hernia repair 2006  and x3  dilatation's  . Anemia   . Asthma   . Chronic back pain    lumbar and thoracic  . Chronic constipation   . Cubital tunnel syndrome, bilateral   . Degenerative arthritis of spine   . Esophageal dysmotility    ultrasound 11-15-2011  . Family history of adverse reaction to anesthesia    sister and mother-- ponv  . Family history of bladder cancer   . Family history of brain cancer   . Family history of breast cancer   . Family history of lung cancer   . Family history of prostate cancer   . Family history of rectal cancer   . Family history of testicular cancer   . Family history of uterine cancer   . Gastrointestinal dysmotility   .  GERD (gastroesophageal reflux disease)   . Hand tingling    fingers bilateral hand due to bilateral cubital tunnel syndrome  . History of esophageal spasm    s/p  esophageal dilation  . History of hiatal hernia    s/p  repair 2006  . Hypercholesterolemia 2009  . Hypertension   . Lactose intolerance   . 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 OA (osteoarthritis)   . Osteopenia   . Panic disorder   . Polyarthralgia   . PONV (postoperative nausea and vomiting)   . Raynauds syndrome   . Scoliosis of thoracic spine 8/01  . Seasonal affective disorder ([place])   . SUI (stress urinary incontinence, female)   . Superficial thrombophlebitis    x2  left lower leg superficial in  2006  prior to vein stripping     Past Surgical History:  Procedure Laterality Date  . ABDOMINAL SURGERY    . D & C HYSTEROSCOPY W/ RESECTION FIBROID  09-21-2002  dr Mohammed Kindle   [place] W/ RESECTION POLYP  12-24-2010  dr Joan Flores  [facility]  . DILATION AND CURETTAGE OF UTERUS    . ESOPHAGEAL DILATION  2013  --- [facility]   Omega dilation for Achalasia  . ESOPHAGOGASTRODUODENOSCOPY N/A 09/20/2017   Procedure: ESOPHAGOGASTRODUODENOSCOPY (EGD);  Surgeon: Clarene Essex, MD;  Location: [facility];  Service: Endoscopy;  Laterality: N/A;  . HELLER MYOTOMY  2993   fundoplication repair hiatal hernia  . KNEE ARTHROSCOPY Left 2006 &  09-27-2003  . KNEE ARTHROSCOPY W/ SYNOVECTOMY Right 09-20-2009  dr Wynelle Link  [facility]  . KNEE SURGERY Right 1962  . ROTATOR CUFF REPAIR Right 09/2011  . TONSILLECTOMY AND ADENOIDECTOMY  1961  . TOTAL KNEE ARTHROPLASTY Bilateral left 02-05-2006 ;  right 08-14-2007   dr Wynelle Link  [facility]  . TOTAL KNEE REVISION Right 11/26/2017   Procedure: Right knee polyethylene revision;  Surgeon: Gaynelle Arabian, MD;  Location: [facility];  Service: Orthopedics;  Laterality: Right;  Adductor Block  . VARICOSE VEIN SURGERY  09/2005    Current Outpatient Medications  Medication Sig Dispense Refill  . ADVAIR DISKUS 250-50 MCG/DOSE AEPB TAKE 1 PUFF BY MOUTH TWICE A DAY 180 each 1  . buPROPion (WELLBUTRIN XL) 150 MG 24 hr tablet TAKE 1 TABLET BY MOUTH EVERY DAY 90 tablet 0  . CALCIUM PO Take 15 mLs by mouth daily.    . Cholecalciferol (VITAMIN D3) 1000 units CAPS Take 1,000 Units by mouth daily.    Marland Kitchen docusate sodium (COLACE) 100 MG capsule Take 100 mg by mouth 2 (two) times daily as needed for mild constipation.    Marland Kitchen esomeprazole (NEXIUM) 40 MG capsule Take 40 mg by mouth 2 (two) times daily. Take 40 mg by mouth daily. May take additional 40 mg if needed for acid reflux    . fluticasone (FLONASE) 50 MCG/ACT nasal spray Place 2 sprays into both nostrils daily. 16 g 6  . gabapentin (NEURONTIN) 100 MG capsule Take one tablet q hs( with the 300 mg tablet), if tolerating in 3 days increase to 100 mg bid, if tolerating in 3 days increase to 100 mg TID 90 capsule 1  . gabapentin  (NEURONTIN) 300 MG capsule TAKE 1 CAPSULE BY MOUTH EVERYDAY AT BEDTIME 90 capsule 0  . hydrochlorothiazide (MICROZIDE) 12.5 MG capsule Take 1 capsule (12.5 mg total) by mouth every morning. 90 capsule 1  . Polyethylene Glycol 3350 (MIRALAX PO) Take by mouth as needed.    Marland Kitchen  raloxifene (EVISTA) 60 MG tablet Take 1 tablet (60 mg total) by mouth daily. 90 tablet 0  . venlafaxine XR (EFFEXOR XR) 37.5 MG 24 hr capsule Take 1 capsule (37.5 mg total) by mouth daily with breakfast. 90 capsule 0   No current facility-administered medications for this visit.      ALLERGIES: Dilaudid [hydromorphone hcl]; Glucagon; Lactose intolerance (gi); and Wasp venom  Family History  Problem Relation Age of Onset  . Hypertension Father   . Heart disease Father   . Stroke Father   . Cancer Father        Testicular, Bladder, Prostate, Lung  . Heart disease Mother   . Stroke Mother   . Osteoporosis Mother   . Uterine cancer Sister 43  . Breast cancer Sister   . Cancer Sister        rectal, liver,lymph nodes  . Diabetes Maternal Grandfather   . Heart disease Maternal Grandfather   . Diabetes Paternal Grandmother   . Breast cancer Maternal Aunt     Social History   Socioeconomic History  . Marital status: Married    Spouse name: Not on file  . Number of children: Not on file  . Years of education: Not on file  . Highest education level: Not on file  Occupational History  . Not on file  Social Needs  . Financial resource strain: Not on file  . Food insecurity:    Worry: Not on file    Inability: Not on file  . Transportation needs:    Medical: Not on file    Non-medical: Not on file  Tobacco Use  . Smoking status: Never Smoker  . Smokeless tobacco: Never Used  Substance and Sexual Activity  . Alcohol use: Yes    Alcohol/week: 6.0 - 8.0 standard drinks    Types: 6 - 8 Glasses of wine per week  . Drug use: No  . Sexual activity: Yes    Partners: Male    Birth control/protection:  Post-menopausal    Comment: vasectomy  Lifestyle  . Physical activity:    Days per week: Not on file    Minutes per session: Not on file  . Stress: Not on file  Relationships  . Social connections:    Talks on phone: Not on file    Gets together: Not on file    Attends religious service: Not on file    Active member of club or organization: Not on file    Attends meetings of clubs or organizations: Not on file    Relationship status: Not on file  . Intimate partner violence:    Fear of current or ex partner: Not on file    Emotionally abused: Not on file    Physically abused: Not on file    Forced sexual activity: Not on file  Other Topics Concern  . Not on file  Social History Narrative  . Not on file    Review of Systems  Constitutional: Negative.   HENT: Negative.   Eyes: Negative.   Respiratory: Negative.   Cardiovascular: Negative.   Gastrointestinal: Negative.   Genitourinary: Negative.   Musculoskeletal: Negative.   Skin: Negative.   Neurological: Negative.   Endo/Heme/Allergies: Negative.   Psychiatric/Behavioral: Negative.     PHYSICAL EXAMINATION:    BP 128/80 (BP Location: Right Arm, Patient Position: Sitting, Cuff Size: Normal)   Pulse 64   Wt 166 lb (75.3 kg)   LMP 09/23/2012   BMI 29.41 kg/m  General appearance: alert, cooperative and appears stated age Neck: no adenopathy, supple, symmetrical, trachea midline and thyroid normal to inspection and palpation Heart: regular rate and rhythm Lungs: CTAB Abdomen: soft, non-tender; bowel sounds normal; no masses,  no organomegaly Extremities: normal, atraumatic, no cyanosis Skin: normal color, texture and turgor, no rashes or lesions Lymph: normal cervical supraclavicular and inguinal nodes Neurologic: grossly normal   Pelvic: External genitalia:  no lesions              Urethra:  normal appearing urethra with no masses, tenderness or lesions              Bartholins and Skenes: normal                  Vagina: normal appearing vagina with normal color and discharge, no lesions              Cervix: no lesions               Sonohysterogram The procedure and risks of the procedure were reviewed with the patient, consent form was signed. A speculum was placed in the vagina and the cervix was cleansed with betadine. The sonohysterogram catheter was inserted into the uterine cavity without difficulty. Saline was infused under direct observation with the ultrasound. An endometrial mass was noted, most c/w an endometrial polyp. The catheter was removed.     Chaperone was present for exam.  ASSESSMENT Left ovary with a 2.3 cm mixed echogenic mass, no blood flow, no ascites. Reviewed CT from last week and from 2014, the same left adnexal mass is noted, minimal growth in 5 years, no lymphadenopathy. Benign appearing Intracavitary uterine mass, suspect polyp    PLAN Will check a CA 125 Once CA 125 results return will consider further imaging with MRI Discussed the possible risk of ovarian torsion Recommend hysteroscopy, removal of intracavitary mass and D&C    An After Visit Summary was printed and given to the patient.

## 2018-05-26 ENCOUNTER — Encounter: Payer: Self-pay | Admitting: Obstetrics and Gynecology

## 2018-05-26 ENCOUNTER — Ambulatory Visit (INDEPENDENT_AMBULATORY_CARE_PROVIDER_SITE_OTHER): Payer: PPO | Admitting: Obstetrics and Gynecology

## 2018-05-26 ENCOUNTER — Other Ambulatory Visit: Payer: Self-pay

## 2018-05-26 ENCOUNTER — Other Ambulatory Visit: Payer: Self-pay | Admitting: Obstetrics and Gynecology

## 2018-05-26 ENCOUNTER — Ambulatory Visit: Payer: PPO

## 2018-05-26 VITALS — BP 128/80 | HR 64 | Wt 166.0 lb

## 2018-05-26 DIAGNOSIS — N949 Unspecified condition associated with female genital organs and menstrual cycle: Secondary | ICD-10-CM

## 2018-05-26 DIAGNOSIS — N9489 Other specified conditions associated with female genital organs and menstrual cycle: Secondary | ICD-10-CM | POA: Diagnosis not present

## 2018-05-26 DIAGNOSIS — R9389 Abnormal findings on diagnostic imaging of other specified body structures: Secondary | ICD-10-CM

## 2018-05-26 DIAGNOSIS — N840 Polyp of corpus uteri: Secondary | ICD-10-CM

## 2018-05-27 ENCOUNTER — Ambulatory Visit: Payer: Self-pay

## 2018-05-27 ENCOUNTER — Encounter: Payer: Self-pay | Admitting: Obstetrics and Gynecology

## 2018-05-27 LAB — CA 125: Cancer Antigen (CA) 125: 13.7 U/mL (ref 0.0–38.1)

## 2018-05-29 ENCOUNTER — Telehealth: Payer: Self-pay | Admitting: *Deleted

## 2018-05-29 DIAGNOSIS — N949 Unspecified condition associated with female genital organs and menstrual cycle: Secondary | ICD-10-CM

## 2018-05-29 NOTE — Telephone Encounter (Signed)
-----   Message from Beth Dom, MD sent at 05/27/2018 10:55 AM EST ----- Please inform the patient that her CA 125 is normal. I would recommend that we get a MRI to try and better delineate what the left ovarian mass is. If she is okay with that, please get it scheduled.

## 2018-05-29 NOTE — Telephone Encounter (Signed)
Notes recorded by Burnice Logan, RN on 05/29/2018 at 3:33 PM EST Left detailed message, ok per dpr, name identified on voicemail. Advised of normal CA 125 per Dr. Talbert Nan. Instructed patient to return call to office to further discuss recommendations. ------

## 2018-06-01 DIAGNOSIS — M25561 Pain in right knee: Secondary | ICD-10-CM | POA: Diagnosis not present

## 2018-06-02 NOTE — Telephone Encounter (Signed)
Left message to call Emanuel Dowson, RN at [facility] [phone].   

## 2018-06-02 NOTE — Telephone Encounter (Signed)
Notes recorded by Burnice Logan, RN on 06/02/2018 at 12:11 PM EST Left message to call Sharee Pimple, RN at [facility].   ------

## 2018-06-03 NOTE — Telephone Encounter (Signed)
Spoke with patient, advised as seen below per Dr. Talbert Nan. Patient states with her family Hx of cancer she would like to proceed with a hysterectomy. Advised patient MRI would still be needed for further evaluation of left ovary mass before proceeding with surgery. Patient agreeable to proceed with MRI. Advised patient our office will precert MRI and [facility] will call patient directly to schedule.   Orders confirmed with [place]. Order placed for MRI pelvis with/without contrast.    Routing to provider for final review. Patient is agreeable to disposition. Will close encounter.   Cc: Lerry Liner

## 2018-06-04 DIAGNOSIS — R198 Other specified symptoms and signs involving the digestive system and abdomen: Secondary | ICD-10-CM | POA: Diagnosis not present

## 2018-06-04 DIAGNOSIS — R1310 Dysphagia, unspecified: Secondary | ICD-10-CM | POA: Diagnosis not present

## 2018-06-04 DIAGNOSIS — K220 Achalasia of cardia: Secondary | ICD-10-CM | POA: Diagnosis not present

## 2018-06-04 DIAGNOSIS — K219 Gastro-esophageal reflux disease without esophagitis: Secondary | ICD-10-CM | POA: Diagnosis not present

## 2018-06-04 DIAGNOSIS — Z800 Family history of malignant neoplasm of digestive organs: Secondary | ICD-10-CM | POA: Diagnosis not present

## 2018-06-04 DIAGNOSIS — K5902 Outlet dysfunction constipation: Secondary | ICD-10-CM | POA: Diagnosis not present

## 2018-06-04 NOTE — Telephone Encounter (Signed)
Call to patient. Per DPR, can leave message on voice mail. Left message to call back to [facility].

## 2018-06-04 NOTE — Telephone Encounter (Signed)
Please have the patient to come in to discuss her options. Let her know that I have discussed her case with my partners as well. If the mass hadn't been there 5 years ago, I would have recommended removal. If she wants to have a BSO, she doesn't need the MRI, we just need to discuss the risks/benifits of hysterectomy with BSO vs BSO and hysteroscopy, D&C.

## 2018-06-05 ENCOUNTER — Telehealth: Payer: Self-pay | Admitting: Obstetrics and Gynecology

## 2018-06-05 DIAGNOSIS — M25561 Pain in right knee: Secondary | ICD-10-CM | POA: Diagnosis not present

## 2018-06-05 NOTE — Telephone Encounter (Signed)
Spoke with patient, voicemail received.  OV scheduled for 06/08/18 at 1pm for surgery consult. Patient verbalizes understanding and is agreeable.   Routing to provider for final review. Patient is agreeable to disposition. Will close encounter.

## 2018-06-05 NOTE — Telephone Encounter (Signed)
Patient states she is returning Sally's call. No open note.

## 2018-06-05 NOTE — Telephone Encounter (Signed)
Patient is returning a call to Sally. °

## 2018-06-05 NOTE — Telephone Encounter (Signed)
Return call to patient. Previous phone encounter has instructions from Dr Talbert Nan recommending office visit to discuss options for surgery. Call to patient. Voice mail confirms name. Per DPR can leave message on voice mail.  Left message calling to advise Dr Talbert Nan has reviewed her call and request to proceed with hysterectomy. Recommends office visit to discuss before proceeding further. Can call back to discuss with me or can call back to schedule appointment.

## 2018-06-08 ENCOUNTER — Ambulatory Visit: Payer: PPO | Admitting: Obstetrics and Gynecology

## 2018-06-08 ENCOUNTER — Encounter: Payer: Self-pay | Admitting: Obstetrics and Gynecology

## 2018-06-08 ENCOUNTER — Other Ambulatory Visit: Payer: Self-pay

## 2018-06-08 VITALS — BP 138/82 | HR 71 | Ht 63.5 in | Wt 164.2 lb

## 2018-06-08 DIAGNOSIS — R9389 Abnormal findings on diagnostic imaging of other specified body structures: Secondary | ICD-10-CM | POA: Diagnosis not present

## 2018-06-08 DIAGNOSIS — Z8049 Family history of malignant neoplasm of other genital organs: Secondary | ICD-10-CM

## 2018-06-08 DIAGNOSIS — N952 Postmenopausal atrophic vaginitis: Secondary | ICD-10-CM

## 2018-06-08 DIAGNOSIS — Z803 Family history of malignant neoplasm of breast: Secondary | ICD-10-CM

## 2018-06-08 DIAGNOSIS — N816 Rectocele: Secondary | ICD-10-CM

## 2018-06-08 DIAGNOSIS — N840 Polyp of corpus uteri: Secondary | ICD-10-CM | POA: Diagnosis not present

## 2018-06-08 DIAGNOSIS — N9489 Other specified conditions associated with female genital organs and menstrual cycle: Secondary | ICD-10-CM | POA: Diagnosis not present

## 2018-06-08 MED ORDER — ESTRADIOL 0.1 MG/GM VA CREA: TOPICAL_CREAM | VAGINAL | 0 refills | Status: DC

## 2018-06-08 NOTE — Progress Notes (Signed)
GYNECOLOGY  VISIT   HPI: 67 y.o.   Married White or Caucasian Not Hispanic or Latino  female   [phone] with Patient's last menstrual period was 09/23/2012.   here for surgery consult.   The patient was noted to have a 2.5 cm low density area in the left ovary on recent CT scan done for bloating/distention. On review of prior CT the radiologist noted it's presence on a 2014 CT, at that time is was 2 cm. No ascites or enlarged nodes noted.  Ultrasound from a few weeks ago She has a strong FH of cancer, including uterine and breast cancer. She has an elevated risk of breast cancer.   GYNECOLOGIC HISTORY: Patient's last menstrual period was 09/23/2012. Contraception: Spouse has had a vasectomy Menopausal hormone therapy: None       OB History    Gravida  2   Para  2   Term  2   Preterm      AB      Living  2     SAB      TAB      Ectopic      Multiple      Live Births  2              Patient Active Problem List   Diagnosis Date Noted  . Increased risk of breast cancer, 23.4% 05/10/2018  . Menopausal vasomotor syndrome 05/10/2018  . Abdominal bloating 05/10/2018  . Genetic testing 03/19/2018  . Family history of uterine cancer   . Family history of breast cancer   . Family history of prostate cancer   . Family history of testicular cancer   . Family history of rectal cancer   . Family history of bladder cancer   . Family history of brain cancer   . Family history of lung cancer   . 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 Osteopenia   . Achalasia of esophagus 10/28/2017  . Thoracic spondylosis 10/14/2017  . Chronic thoracic back pain 08/12/2017  . Chronic neck pain 08/12/2017  . 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  . DDD (degenerative disc disease) 10/16/2012  . Scoliosis 10/16/2012  . Rectocele without mention of uterine prolapse 10/14/2012  . Dysphagia, unspecified(787.20) 10/14/2012  . Rheumatoid arthritis ([place]) 10/07/2012   . Arthralgia 10/07/2012  . OA (osteoarthritis) 10/07/2012  . Eczema 08/08/2011  . Hyperlipidemia 07/24/2011  . Esophageal reflux 05/24/2009  . Allergic rhinitis 05/24/2009  . Lesion of ulnar nerve 05/24/2009  . Essential hypertension 02/27/2009  . Asthma 02/27/2009    Past Medical History:  Diagnosis Date  . Achalasia of esophagus    s/p  heller myotomy hiatal hernia repair 2006  and x3  dilatation's  . Anemia   . Asthma   . Chronic back pain    lumbar and thoracic  . Chronic constipation   . Cubital tunnel syndrome, bilateral   . Degenerative arthritis of spine   . Esophageal dysmotility    ultrasound 11-15-2011  . Family history of adverse reaction to anesthesia    sister and mother-- ponv  . Family history of bladder cancer   . Family history of brain cancer   . Family history of breast cancer   . Family history of lung cancer   . Family history of prostate cancer   . Family history of rectal cancer   . Family history of testicular cancer   . Family history of uterine cancer   . Gastrointestinal dysmotility   .  GERD (gastroesophageal reflux disease)   . Hand tingling    fingers bilateral hand due to bilateral cubital tunnel syndrome  . History of esophageal spasm    s/p  esophageal dilation  . History of hiatal hernia    s/p  repair 2006  . Hypercholesterolemia 2009  . Hypertension   . Lactose intolerance   . 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 OA (osteoarthritis)   . Osteopenia   . Panic disorder   . Polyarthralgia   . PONV (postoperative nausea and vomiting)   . Raynauds syndrome   . Scoliosis of thoracic spine 8/01  . Seasonal affective disorder ([place])   . SUI (stress urinary incontinence, female)   . Superficial thrombophlebitis    x2  left lower leg superficial in  2006  prior to vein stripping     Past Surgical History:  Procedure Laterality Date  . D & C HYSTEROSCOPY W/ RESECTION FIBROID  09-21-2002  dr Mohammed Kindle  [place] W/  RESECTION POLYP  12-24-2010  dr Joan Flores  [facility]  . DILATION AND CURETTAGE OF UTERUS    . ESOPHAGEAL DILATION  2013  --- [facility]   Omega dilation for Achalasia  . ESOPHAGOGASTRODUODENOSCOPY N/A 09/20/2017   Procedure: ESOPHAGOGASTRODUODENOSCOPY (EGD);  Surgeon: Clarene Essex, MD;  Location: [facility];  Service: Endoscopy;  Laterality: N/A;  . HELLER MYOTOMY  6144   fundoplication repair hiatal hernia  . KNEE ARTHROSCOPY Left 2006 &  09-27-2003  . KNEE ARTHROSCOPY W/ SYNOVECTOMY Right 09-20-2009  dr Wynelle Link  [facility]  . KNEE SURGERY Right 1962  . ROTATOR CUFF REPAIR Right 09/2011  . TONSILLECTOMY AND ADENOIDECTOMY  1961  . TOTAL KNEE ARTHROPLASTY Bilateral left 02-05-2006 ;  right 08-14-2007   dr Wynelle Link  [facility]  . TOTAL KNEE REVISION Right 11/26/2017   Procedure: Right knee polyethylene revision;  Surgeon: Gaynelle Arabian, MD;  Location: [facility];  Service: Orthopedics;  Laterality: Right;  Adductor Block  . VARICOSE VEIN SURGERY  09/2005    Current Outpatient Medications  Medication Sig Dispense Refill  . ADVAIR DISKUS 250-50 MCG/DOSE AEPB TAKE 1 PUFF BY MOUTH TWICE A DAY 180 each 1  . buPROPion (WELLBUTRIN XL) 150 MG 24 hr tablet TAKE 1 TABLET BY MOUTH EVERY DAY 90 tablet 0  . CALCIUM PO Take 15 mLs by mouth daily.    . Cholecalciferol (VITAMIN D3) 1000 units CAPS Take 1,000 Units by mouth daily.    Marland Kitchen docusate sodium (COLACE) 100 MG capsule Take 100 mg by mouth 2 (two) times daily as needed for mild constipation.    Marland Kitchen esomeprazole (NEXIUM) 40 MG capsule Take 40 mg by mouth 2 (two) times daily. Take 40 mg by mouth daily. May take additional 40 mg if needed for acid reflux    . fluticasone (FLONASE) 50 MCG/ACT nasal spray Place 2 sprays into both nostrils daily. 16 g 6  . gabapentin (NEURONTIN) 100 MG capsule Take one tablet q hs( with the 300 mg tablet), if tolerating in 3 days increase to 100 mg bid, if tolerating in 3 days increase to 100 mg TID 90 capsule 1  . gabapentin (NEURONTIN) 300 MG capsule TAKE  1 CAPSULE BY MOUTH EVERYDAY AT BEDTIME 90 capsule 0  . hydrochlorothiazide (MICROZIDE) 12.5 MG capsule Take 1 capsule (12.5 mg total) by mouth every morning. 90 capsule 1  . Polyethylene Glycol 3350 (MIRALAX PO) Take by mouth as needed.    . raloxifene (EVISTA) 60 MG tablet Take  1 tablet (60 mg total) by mouth daily. 90 tablet 0  . venlafaxine XR (EFFEXOR XR) 37.5 MG 24 hr capsule Take 1 capsule (37.5 mg total) by mouth daily with breakfast. 90 capsule 0   No current facility-administered medications for this visit.      ALLERGIES: Dilaudid [hydromorphone hcl]; Glucagon; Lactose intolerance (gi); and Wasp venom  Family History  Problem Relation Age of Onset  . Hypertension Father   . Heart disease Father   . Stroke Father   . Cancer Father        Testicular, Bladder, Prostate, Lung  . Heart disease Mother   . Stroke Mother   . Osteoporosis Mother   . Uterine cancer Sister 66  . Breast cancer Sister   . Cancer Sister        rectal, liver,lymph nodes  . Diabetes Maternal Grandfather   . Heart disease Maternal Grandfather   . Diabetes Paternal Grandmother   . Breast cancer Maternal Aunt     Social History   Socioeconomic History  . Marital status: Married    Spouse name: Not on file  . Number of children: Not on file  . Years of education: Not on file  . Highest education level: Not on file  Occupational History  . Not on file  Social Needs  . Financial resource strain: Not on file  . Food insecurity:    Worry: Not on file    Inability: Not on file  . Transportation needs:    Medical: Not on file    Non-medical: Not on file  Tobacco Use  . Smoking status: Never Smoker  . Smokeless tobacco: Never Used  Substance and Sexual Activity  . Alcohol use: Yes    Alcohol/week: 6.0 - 8.0 standard drinks    Types: 6 - 8 Glasses of wine per week  . Drug use: No  . Sexual activity: Yes    Partners: Male    Birth control/protection: Post-menopausal    Comment: vasectomy   Lifestyle  . Physical activity:    Days per week: Not on file    Minutes per session: Not on file  . Stress: Not on file  Relationships  . Social connections:    Talks on phone: Not on file    Gets together: Not on file    Attends religious service: Not on file    Active member of club or organization: Not on file    Attends meetings of clubs or organizations: Not on file    Relationship status: Not on file  . Intimate partner violence:    Fear of current or ex partner: Not on file    Emotionally abused: Not on file    Physically abused: Not on file    Forced sexual activity: Not on file  Other Topics Concern  . Not on file  Social History Narrative  . Not on file    Review of Systems  Constitutional: Negative.   HENT: Negative.   Eyes: Negative.   Respiratory: Negative.   Cardiovascular: Negative.   Gastrointestinal: Negative.   Genitourinary: Negative.   Musculoskeletal: Negative.   Skin: Negative.   Neurological: Negative.   Endo/Heme/Allergies: Negative.   Psychiatric/Behavioral: Negative.     PHYSICAL EXAMINATION:    BP 138/82 (BP Location: Right Arm, Patient Position: Sitting, Cuff Size: Normal)   Pulse 71   Ht 5' 3.5" (1.613 m)   Wt 164 lb 3.2 oz (74.5 kg)   LMP 09/23/2012   BMI 28.63 kg/m  General appearance: alert, cooperative and appears stated age  Pelvic: External genitalia:  no lesions              Urethra:  normal appearing urethra with no masses, tenderness or lesions              Bartholins and Skenes: normal                 Vagina: atrophic appearing vagina with normal color and discharge, no lesions. Perineum is tight, small grade 2 rectocele prolapses through the introitus              Cervix: no lesions   The risks of endometrial biopsy were reviewed and a consent was obtained.  A speculum was placed in the vagina and the cervix was cleansed with betadine. The mini-pipelle was placed into the endometrial cavity. The uterus sounded to 7  cm. The endometrial biopsy was performed x 2, minimal  tissue was obtained (definitely in the cavity). The speculum was removed. There were no complications.                 Chaperone was present for exam.  ASSESSMENT Left ovarian mass, normal CA 125, the mass has grown 0.5 cm in 5 years Endometrial polyp Strong FH of cancer, including uterine and breast cancer The patient desires hysterectomy Small grade 2 rectocele, symptomatic Atrophic vaginitis    PLAN Discussed option of Laparoscopic BSO with hysteroscopic polypectomy vs TLH/BSO. Depending on the results of the polypectomy alone, she is aware that she could need hysterectomy.  Patient prefers hysterectomy We discussed the risks of surgery and the increased recovery time with hysterectomy. We also discussed the small risk of needing further surgery  Will do an endometrial biopsy today  Plan TLH/BSO/Rectocele repair and cystoscopy Would not do a perineorrhaphy She would need to stay on miralax after surgery Will pre-treat with vaginal estrogen  She will need to return for a pre-op    An After Visit Summary was printed and given to the patient.  ~25 minutes face to face time of which over 50% was spent in counseling.

## 2018-06-09 ENCOUNTER — Telehealth: Payer: Self-pay | Admitting: *Deleted

## 2018-06-09 DIAGNOSIS — R9389 Abnormal findings on diagnostic imaging of other specified body structures: Secondary | ICD-10-CM | POA: Diagnosis not present

## 2018-06-09 NOTE — Telephone Encounter (Signed)
Call to patient to review surgery date options. Patient traveling by car on 07-01-18. Advised recommend schedule surgery after  trip due to prolonged time in [place]. Patient agreeable to 07-14-18.  Advised will schedule and call her back.

## 2018-06-09 NOTE — Addendum Note (Signed)
Addended by: Dorothy Spark on: 06/09/2018 05:32 PM   Modules accepted: Orders

## 2018-06-10 NOTE — Telephone Encounter (Signed)
Detailed message left for patient on mobile number per DPR for patient to return call to review Surgery information form and schedule pre and post op appointments with Dr. Talbert Nan. Advised could ask to speak to [place] or Gay Filler.

## 2018-06-10 NOTE — Telephone Encounter (Signed)
Patient returned call. Surgery information form reviewed in detail with patient and she verbalized understanding. Pre op appointment scheduled for 06-23-09 at 1515 and patient agreeable to date and time of appointment. Patient aware will receive a copy of surgery information at pre op. Post op appointments scheduled for 07-21-18 at 1515 and 08-11-18 at 1515. Patient agreeable to date and time of both appointments. Aware [facility] will contact her directly to schedule pre op appointment with their facility.   Routing to provider and will close encounter.   Cc Lamont Snowball, RN

## 2018-06-16 DIAGNOSIS — M25561 Pain in right knee: Secondary | ICD-10-CM | POA: Diagnosis not present

## 2018-06-17 ENCOUNTER — Telehealth: Payer: Self-pay | Admitting: Obstetrics and Gynecology

## 2018-06-17 NOTE — Telephone Encounter (Signed)
Call placed to patient to review benefits for scheduled surgery. Left voicemail message requesting a return call

## 2018-06-17 NOTE — Telephone Encounter (Signed)
Patient returned call. Spoke with patient regarding benefit for scheduled surgery. Patient understood and agreeable. Patient will confirm surgery at pre-operative appointment on 06/23/18. Patient aware this is professional benefit only. Patient aware will be contacted by hospital for separate benefits. Will close encounter

## 2018-06-18 DIAGNOSIS — M25561 Pain in right knee: Secondary | ICD-10-CM | POA: Diagnosis not present

## 2018-06-19 ENCOUNTER — Other Ambulatory Visit: Payer: Self-pay

## 2018-06-22 DIAGNOSIS — M25561 Pain in right knee: Secondary | ICD-10-CM | POA: Diagnosis not present

## 2018-06-22 NOTE — Progress Notes (Signed)
GYNECOLOGY  VISIT   HPI: 67 y.o.   Married White or Caucasian Not Hispanic or Latino  female   [phone] with Patient's last menstrual period was 09/23/2012.   here for a pre-op visit with her husband. The patient was noted to have a 2.5 cm ovarian mass on CT last month (done for bloating), there was no ascites or lymphadenopathy. On review of prior CT the same mass was present in 2014, it was 2 cm at that time. An ultrasound was done which showed a 2.3 cm mixed echogenic mass in the left ovary, no blood flow. At the time of the ultrasound her endometrium was thickened and suspicious for an endometrial polyp. Sonohysterogram was c/w a polyp.   CA 125 was normal.   We discussed hysteroscopy, D&C and laparoscopic BSO. The patient has a strong FH of cancer including endometrial cancer and strongly desires hysterectomy. We discussed the risks and benefits and she would like to proceed with hysterectomy. An endometrial biopsy was performed which was benign (no polyp in the specimen). She also has a symptomatic, small rectocele that she would like repaired.   GYNECOLOGIC HISTORY: Patient's last menstrual period was 09/23/2012. Contraception: Post menopausal Menopausal hormone therapy: Has Estrace cream but has not started        OB History    Gravida  2   Para  2   Term  2   Preterm      AB      Living  2     SAB      TAB      Ectopic      Multiple      Live Births  2              Patient Active Problem List   Diagnosis Date Noted  . Increased risk of breast cancer, 23.4% 05/10/2018  . Menopausal vasomotor syndrome 05/10/2018  . Abdominal bloating 05/10/2018  . Genetic testing 03/19/2018  . Family history of uterine cancer   . Family history of breast cancer   . Family history of prostate cancer   . Family history of testicular cancer   . Family history of rectal cancer   . Family history of bladder cancer   . Family history of brain cancer   . Family history of lung  cancer   . 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 Osteopenia   . Achalasia of esophagus 10/28/2017  . Thoracic spondylosis 10/14/2017  . Chronic thoracic back pain 08/12/2017  . Chronic neck pain 08/12/2017  . 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  . DDD (degenerative disc disease) 10/16/2012  . Scoliosis 10/16/2012  . Rectocele without mention of uterine prolapse 10/14/2012  . Dysphagia, unspecified(787.20) 10/14/2012  . Rheumatoid arthritis ([place]) 10/07/2012  . Arthralgia 10/07/2012  . OA (osteoarthritis) 10/07/2012  . Eczema 08/08/2011  . Hyperlipidemia 07/24/2011  . Esophageal reflux 05/24/2009  . Allergic rhinitis 05/24/2009  . Lesion of ulnar nerve 05/24/2009  . Essential hypertension 02/27/2009  . Asthma 02/27/2009    Past Medical History:  Diagnosis Date  . Achalasia of esophagus    s/p  heller myotomy hiatal hernia repair 2006  and x3  dilatation's  . Anemia   . Asthma   . Chronic back pain    lumbar and thoracic  . Chronic constipation   . Cubital tunnel syndrome, bilateral   . Degenerative arthritis of spine   . Esophageal dysmotility    ultrasound 11-15-2011  . Family history  of adverse reaction to anesthesia    sister and mother-- ponv  . Family history of bladder cancer   . Family history of brain cancer   . Family history of breast cancer   . Family history of lung cancer   . Family history of prostate cancer   . Family history of rectal cancer   . Family history of testicular cancer   . Family history of uterine cancer   . Gastrointestinal dysmotility   . GERD (gastroesophageal reflux disease)   . Hand tingling    fingers bilateral hand due to bilateral cubital tunnel syndrome  . History of esophageal spasm    s/p  esophageal dilation  . History of hiatal hernia    s/p  repair 2006  . Hypercholesterolemia 2009  . Hypertension   . Lactose intolerance   . 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 OA (osteoarthritis)   .  Osteopenia   . Panic disorder   . Polyarthralgia   . PONV (postoperative nausea and vomiting)   . Raynauds syndrome   . Scoliosis of thoracic spine 8/01  . Seasonal affective disorder ([place])   . SUI (stress urinary incontinence, female)   . Superficial thrombophlebitis    x2  left lower leg superficial in  2006  prior to vein stripping     Past Surgical History:  Procedure Laterality Date  . D & C HYSTEROSCOPY W/ RESECTION FIBROID  09-21-2002  dr Mohammed Kindle  [place] W/ RESECTION POLYP  12-24-2010  dr Joan Flores  [facility]  . DILATION AND CURETTAGE OF UTERUS    . ESOPHAGEAL DILATION  2013  --- [facility]   Omega dilation for Achalasia  . ESOPHAGOGASTRODUODENOSCOPY N/A 09/20/2017   Procedure: ESOPHAGOGASTRODUODENOSCOPY (EGD);  Surgeon: Clarene Essex, MD;  Location: [facility];  Service: Endoscopy;  Laterality: N/A;  . HELLER MYOTOMY  1245   fundoplication repair hiatal hernia  . KNEE ARTHROSCOPY Left 2006 &  09-27-2003  . KNEE ARTHROSCOPY W/ SYNOVECTOMY Right 09-20-2009  dr Wynelle Link  [facility]  . KNEE SURGERY Right 1962  . ROTATOR CUFF REPAIR Right 09/2011  . TONSILLECTOMY AND ADENOIDECTOMY  1961  . TOTAL KNEE ARTHROPLASTY Bilateral left 02-05-2006 ;  right 08-14-2007   dr Wynelle Link  [facility]  . TOTAL KNEE REVISION Right 11/26/2017   Procedure: Right knee polyethylene revision;  Surgeon: Gaynelle Arabian, MD;  Location: [facility];  Service: Orthopedics;  Laterality: Right;  Adductor Block  . VARICOSE VEIN SURGERY  09/2005    Current Outpatient Medications  Medication Sig Dispense Refill  . ADVAIR DISKUS 250-50 MCG/DOSE AEPB TAKE 1 PUFF BY MOUTH TWICE A DAY 180 each 1  . buPROPion (WELLBUTRIN XL) 150 MG 24 hr tablet TAKE 1 TABLET BY MOUTH EVERY DAY 90 tablet 0  . CALCIUM PO Take 15 mLs by mouth daily.    . Cholecalciferol (VITAMIN D3) 1000 units CAPS Take 1,000 Units by mouth daily.    Marland Kitchen docusate sodium (COLACE) 100 MG capsule Take 100 mg by mouth 2 (two) times daily as needed for mild constipation.     Marland Kitchen esomeprazole (NEXIUM) 40 MG capsule Take 40 mg by mouth 2 (two) times daily. Take 40 mg by mouth daily. May take additional 40 mg if needed for acid reflux    . fluticasone (FLONASE) 50 MCG/ACT nasal spray Place 2 sprays into both nostrils daily. 16 g 6  . gabapentin (NEURONTIN) 100 MG capsule Take one tablet q hs( with the 300 mg tablet),  if tolerating in 3 days increase to 100 mg bid, if tolerating in 3 days increase to 100 mg TID 90 capsule 1  . gabapentin (NEURONTIN) 300 MG capsule TAKE 1 CAPSULE BY MOUTH EVERYDAY AT BEDTIME 90 capsule 0  . hydrochlorothiazide (MICROZIDE) 12.5 MG capsule Take 1 capsule (12.5 mg total) by mouth every morning. 90 capsule 1  . Polyethylene Glycol 3350 (MIRALAX PO) Take by mouth as needed.    . raloxifene (EVISTA) 60 MG tablet Take 1 tablet (60 mg total) by mouth daily. 90 tablet 0  . venlafaxine XR (EFFEXOR XR) 37.5 MG 24 hr capsule Take 1 capsule (37.5 mg total) by mouth daily with breakfast. 90 capsule 0  . estradiol (ESTRACE) 0.1 MG/GM vaginal cream 1 gram vaginally twice weekly (Patient not taking: Reported on 06/23/2018) 42.5 g 0   No current facility-administered medications for this visit.      ALLERGIES: Dilaudid [hydromorphone hcl]; Glucagon; Lactose intolerance (gi); and Wasp venom  Family History  Problem Relation Age of Onset  . Hypertension Father   . Heart disease Father   . Stroke Father   . Cancer Father        Testicular, Bladder, Prostate, Lung  . Heart disease Mother   . Stroke Mother   . Osteoporosis Mother   . Uterine cancer Sister 56  . Breast cancer Sister   . Cancer Sister        rectal, liver,lymph nodes  . Diabetes Maternal Grandfather   . Heart disease Maternal Grandfather   . Diabetes Paternal Grandmother   . Breast cancer Maternal Aunt     Social History   Socioeconomic History  . Marital status: Married    Spouse name: Not on file  . Number of children: Not on file  . Years of education: Not on file  .  Highest education level: Not on file  Occupational History  . Not on file  Social Needs  . Financial resource strain: Not on file  . Food insecurity:    Worry: Not on file    Inability: Not on file  . Transportation needs:    Medical: Not on file    Non-medical: Not on file  Tobacco Use  . Smoking status: Never Smoker  . Smokeless tobacco: Never Used  Substance and Sexual Activity  . Alcohol use: Yes    Alcohol/week: 6.0 - 8.0 standard drinks    Types: 6 - 8 Glasses of wine per week  . Drug use: No  . Sexual activity: Yes    Partners: Male    Birth control/protection: Post-menopausal    Comment: vasectomy  Lifestyle  . Physical activity:    Days per week: Not on file    Minutes per session: Not on file  . Stress: Not on file  Relationships  . Social connections:    Talks on phone: Not on file    Gets together: Not on file    Attends religious service: Not on file    Active member of club or organization: Not on file    Attends meetings of clubs or organizations: Not on file    Relationship status: Not on file  . Intimate partner violence:    Fear of current or ex partner: Not on file    Emotionally abused: Not on file    Physically abused: Not on file    Forced sexual activity: Not on file  Other Topics Concern  . Not on file  Social History Narrative  . Not  on file    Review of Systems  Constitutional: Negative.   HENT: Negative.   Eyes: Negative.   Respiratory: Negative.   Cardiovascular: Negative.   Gastrointestinal: Negative.   Genitourinary: Negative.   Musculoskeletal: Negative.   Skin: Negative.   Neurological: Negative.   Endo/Heme/Allergies: Negative.   Psychiatric/Behavioral: Negative.   LONG TERM CONSTIPATION, BM Q 3 DAYS  PHYSICAL EXAMINATION:    BP 140/82 (BP Location: Right Arm, Patient Position: Sitting, Cuff Size: Normal)   Pulse 72   Wt 164 lb 6.4 oz (74.6 kg)   LMP 09/23/2012   BMI 28.67 kg/m     General appearance: alert,  cooperative and appears stated age Neck: no adenopathy, supple, symmetrical, trachea midline and thyroid normal to inspection and palpation Heart: regular rate and rhythm Lungs: CTAB Abdomen: soft, non-tender; bowel sounds normal; no masses,  no organomegaly Extremities: normal, atraumatic, no cyanosis Skin: normal color, texture and turgor, no rashes or lesions Lymph: normal cervical supraclavicular and inguinal nodes Neurologic: grossly normal     ASSESSMENT Complex left ovarian mass, slight growth in the last 5 years. No ascites, no lymphadenopathy, normal CA 125 Endometrial polyp Strong FH of cancer including uterine cancer Rectocele HTN Achalasia of her esophagus, severe reflux issues Arthritis, back and neck issues. Will position awake in the OR    PLAN We discussed hysteroscopy, polypectomy, D&C with laparoscopic BSO and rectocele repair. She strongly desires hysterectomy secondary to her concern of uterine cancer and would like to have that at the time of her laparoscopic BSO. She understands that if she has any cancer in the polyp she could potentially end up with another surgery. Will plan TLH/BSO/rectocele repair/cystoscopy We discussed the risks of surgery, including but not limited to: bleeding, infection, damage to bowel/bladder/vessels/ureters, need for further surgery. We also discussed the risk of dyspareunia, recurrence of prolapse and risk of vaginal cuff dehiscence.  She will need an OG tube in the OR and an Anesthesia consult prior to surgery She will start the estrace cream now Stop Evista now    An After Visit Summary was printed and given to the patient.  Over 25 minutes face to face time of which over 50% was spent in counseling.   CC: Dr [name]

## 2018-06-23 ENCOUNTER — Other Ambulatory Visit: Payer: Self-pay

## 2018-06-23 ENCOUNTER — Ambulatory Visit (INDEPENDENT_AMBULATORY_CARE_PROVIDER_SITE_OTHER): Payer: PPO | Admitting: Obstetrics and Gynecology

## 2018-06-23 ENCOUNTER — Encounter: Payer: Self-pay | Admitting: Obstetrics and Gynecology

## 2018-06-23 VITALS — BP 140/82 | HR 72 | Wt 164.4 lb

## 2018-06-23 DIAGNOSIS — N840 Polyp of corpus uteri: Secondary | ICD-10-CM

## 2018-06-23 DIAGNOSIS — N9489 Other specified conditions associated with female genital organs and menstrual cycle: Secondary | ICD-10-CM

## 2018-06-23 DIAGNOSIS — Z8049 Family history of malignant neoplasm of other genital organs: Secondary | ICD-10-CM

## 2018-06-23 MED ORDER — OXYCODONE HCL 5 MG/5ML PO SOLN: 5.0000 mg | ORAL | 0 refills | Status: AC | PRN

## 2018-06-23 MED ORDER — IBUPROFEN 100 MG/5ML PO SUSP: 600.0000 mg | Freq: Four times a day (QID) | ORAL | 1 refills | Status: DC | PRN

## 2018-06-26 DIAGNOSIS — Z471 Aftercare following joint replacement surgery: Secondary | ICD-10-CM | POA: Diagnosis not present

## 2018-06-26 DIAGNOSIS — M7061 Trochanteric bursitis, right hip: Secondary | ICD-10-CM | POA: Diagnosis not present

## 2018-06-26 DIAGNOSIS — Z96651 Presence of right artificial knee joint: Secondary | ICD-10-CM | POA: Diagnosis not present

## 2018-06-29 NOTE — H&P (Signed)
GYNECOLOGY  VISIT   HPI: 67 y.o.   Married White or Caucasian Not Hispanic or Latino  female   [phone] with Patient's last menstrual period was 09/23/2012.   here for a pre-op visit with her husband. The patient was noted to have a 2.5 cm ovarian mass on CT last month (done for bloating), there was no ascites or lymphadenopathy. On review of prior CT the same mass was present in 2014, it was 2 cm at that time. An ultrasound was done which showed a 2.3 cm mixed echogenic mass in the left ovary, no blood flow. At the time of the ultrasound her endometrium was thickened and suspicious for an endometrial polyp. Sonohysterogram was c/w a polyp.   CA 125 was normal.   We discussed hysteroscopy, D&C and laparoscopic BSO. The patient has a strong FH of cancer including endometrial cancer and strongly desires hysterectomy. We discussed the risks and benefits and she would like to proceed with hysterectomy. An endometrial biopsy was performed which was benign (no polyp in the specimen). She also has a symptomatic, small rectocele that she would like repaired.   GYNECOLOGIC HISTORY: Patient's last menstrual period was 09/23/2012. Contraception: Post menopausal Menopausal hormone therapy: Has Estrace cream but has not started                OB History    Gravida  2   Para  2   Term  2   Preterm      AB      Living  2     SAB      TAB      Ectopic      Multiple      Live Births  2                  Patient Active Problem List   Diagnosis Date Noted  . Increased risk of breast cancer, 23.4% 05/10/2018  . Menopausal vasomotor syndrome 05/10/2018  . Abdominal bloating 05/10/2018  . Genetic testing 03/19/2018  . Family history of uterine cancer   . Family history of breast cancer   . Family history of prostate cancer   . Family history of testicular cancer   . Family history of rectal cancer   . Family history of bladder cancer   . Family history of brain cancer    . Family history of lung cancer   . 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 Osteopenia   . Achalasia of esophagus 10/28/2017  . Thoracic spondylosis 10/14/2017  . Chronic thoracic back pain 08/12/2017  . Chronic neck pain 08/12/2017  . 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  . DDD (degenerative disc disease) 10/16/2012  . Scoliosis 10/16/2012  . Rectocele without mention of uterine prolapse 10/14/2012  . Dysphagia, unspecified(787.20) 10/14/2012  . Rheumatoid arthritis ([place]) 10/07/2012  . Arthralgia 10/07/2012  . OA (osteoarthritis) 10/07/2012  . Eczema 08/08/2011  . Hyperlipidemia 07/24/2011  . Esophageal reflux 05/24/2009  . Allergic rhinitis 05/24/2009  . Lesion of ulnar nerve 05/24/2009  . Essential hypertension 02/27/2009  . Asthma 02/27/2009        Past Medical History:  Diagnosis Date  . Achalasia of esophagus    s/p  heller myotomy hiatal hernia repair 2006  and x3  dilatation's  . Anemia   . Asthma   . Chronic back pain    lumbar and thoracic  . Chronic constipation   . Cubital tunnel syndrome, bilateral   . Degenerative arthritis  of spine   . Esophageal dysmotility    ultrasound 11-15-2011  . Family history of adverse reaction to anesthesia    sister and mother-- ponv  . Family history of bladder cancer   . Family history of brain cancer   . Family history of breast cancer   . Family history of lung cancer   . Family history of prostate cancer   . Family history of rectal cancer   . Family history of testicular cancer   . Family history of uterine cancer   . Gastrointestinal dysmotility   . GERD (gastroesophageal reflux disease)   . Hand tingling    fingers bilateral hand due to bilateral cubital tunnel syndrome  . History of esophageal spasm    s/p  esophageal dilation  . History of hiatal hernia    s/p  repair 2006  . Hypercholesterolemia 2009  . Hypertension   . Lactose intolerance    . 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 OA (osteoarthritis)   . Osteopenia   . Panic disorder   . Polyarthralgia   . PONV (postoperative nausea and vomiting)   . Raynauds syndrome   . Scoliosis of thoracic spine 8/01  . Seasonal affective disorder ([place])   . SUI (stress urinary incontinence, female)   . Superficial thrombophlebitis    x2  left lower leg superficial in  2006  prior to vein stripping          Past Surgical History:  Procedure Laterality Date  . D & C HYSTEROSCOPY W/ RESECTION FIBROID  09-21-2002  dr Mohammed Kindle  [place] W/ RESECTION POLYP  12-24-2010  dr Joan Flores  [facility]  . DILATION AND CURETTAGE OF UTERUS    . ESOPHAGEAL DILATION  2013  --- [facility]   Omega dilation for Achalasia  . ESOPHAGOGASTRODUODENOSCOPY N/A 09/20/2017   Procedure: ESOPHAGOGASTRODUODENOSCOPY (EGD);  Surgeon: Clarene Essex, MD;  Location: [facility];  Service: Endoscopy;  Laterality: N/A;  . HELLER MYOTOMY  6160   fundoplication repair hiatal hernia  . KNEE ARTHROSCOPY Left 2006 &  09-27-2003  . KNEE ARTHROSCOPY W/ SYNOVECTOMY Right 09-20-2009  dr Wynelle Link  [facility]  . KNEE SURGERY Right 1962  . ROTATOR CUFF REPAIR Right 09/2011  . TONSILLECTOMY AND ADENOIDECTOMY  1961  . TOTAL KNEE ARTHROPLASTY Bilateral left 02-05-2006 ;  right 08-14-2007   dr Wynelle Link  [facility]  . TOTAL KNEE REVISION Right 11/26/2017   Procedure: Right knee polyethylene revision;  Surgeon: Gaynelle Arabian, MD;  Location: [facility];  Service: Orthopedics;  Laterality: Right;  Adductor Block  . VARICOSE VEIN SURGERY  09/2005          Current Outpatient Medications  Medication Sig Dispense Refill  . ADVAIR DISKUS 250-50 MCG/DOSE AEPB TAKE 1 PUFF BY MOUTH TWICE A DAY 180 each 1  . buPROPion (WELLBUTRIN XL) 150 MG 24 hr tablet TAKE 1 TABLET BY MOUTH EVERY DAY 90 tablet 0  . CALCIUM PO Take 15 mLs by mouth daily.    . Cholecalciferol (VITAMIN D3) 1000 units CAPS Take 1,000 Units by mouth daily.    Marland Kitchen docusate  sodium (COLACE) 100 MG capsule Take 100 mg by mouth 2 (two) times daily as needed for mild constipation.    Marland Kitchen esomeprazole (NEXIUM) 40 MG capsule Take 40 mg by mouth 2 (two) times daily. Take 40 mg by mouth daily. May take additional 40 mg if needed for acid reflux    . fluticasone (FLONASE) 50 MCG/ACT nasal spray  Place 2 sprays into both nostrils daily. 16 g 6  . gabapentin (NEURONTIN) 100 MG capsule Take one tablet q hs( with the 300 mg tablet), if tolerating in 3 days increase to 100 mg bid, if tolerating in 3 days increase to 100 mg TID 90 capsule 1  . gabapentin (NEURONTIN) 300 MG capsule TAKE 1 CAPSULE BY MOUTH EVERYDAY AT BEDTIME 90 capsule 0  . hydrochlorothiazide (MICROZIDE) 12.5 MG capsule Take 1 capsule (12.5 mg total) by mouth every morning. 90 capsule 1  . Polyethylene Glycol 3350 (MIRALAX PO) Take by mouth as needed.    . raloxifene (EVISTA) 60 MG tablet Take 1 tablet (60 mg total) by mouth daily. 90 tablet 0  . venlafaxine XR (EFFEXOR XR) 37.5 MG 24 hr capsule Take 1 capsule (37.5 mg total) by mouth daily with breakfast. 90 capsule 0  . estradiol (ESTRACE) 0.1 MG/GM vaginal cream 1 gram vaginally twice weekly (Patient not taking: Reported on 06/23/2018) 42.5 g 0   No current facility-administered medications for this visit.      ALLERGIES: Dilaudid [hydromorphone hcl]; Glucagon; Lactose intolerance (gi); and Wasp venom       Family History  Problem Relation Age of Onset  . Hypertension Father   . Heart disease Father   . Stroke Father   . Cancer Father        Testicular, Bladder, Prostate, Lung  . Heart disease Mother   . Stroke Mother   . Osteoporosis Mother   . Uterine cancer Sister 70  . Breast cancer Sister   . Cancer Sister        rectal, liver,lymph nodes  . Diabetes Maternal Grandfather   . Heart disease Maternal Grandfather   . Diabetes Paternal Grandmother   . Breast cancer Maternal Aunt     Social History        Socioeconomic  History  . Marital status: Married    Spouse name: Not on file  . Number of children: Not on file  . Years of education: Not on file  . Highest education level: Not on file  Occupational History  . Not on file  Social Needs  . Financial resource strain: Not on file  . Food insecurity:    Worry: Not on file    Inability: Not on file  . Transportation needs:    Medical: Not on file    Non-medical: Not on file  Tobacco Use  . Smoking status: Never Smoker  . Smokeless tobacco: Never Used  Substance and Sexual Activity  . Alcohol use: Yes    Alcohol/week: 6.0 - 8.0 standard drinks    Types: 6 - 8 Glasses of wine per week  . Drug use: No  . Sexual activity: Yes    Partners: Male    Birth control/protection: Post-menopausal    Comment: vasectomy  Lifestyle  . Physical activity:    Days per week: Not on file    Minutes per session: Not on file  . Stress: Not on file  Relationships  . Social connections:    Talks on phone: Not on file    Gets together: Not on file    Attends religious service: Not on file    Active member of club or organization: Not on file    Attends meetings of clubs or organizations: Not on file    Relationship status: Not on file  . Intimate partner violence:    Fear of current or ex partner: Not on file    Emotionally  abused: Not on file    Physically abused: Not on file    Forced sexual activity: Not on file  Other Topics Concern  . Not on file  Social History Narrative  . Not on file    Review of Systems  Constitutional: Negative.   HENT: Negative.   Eyes: Negative.   Respiratory: Negative.   Cardiovascular: Negative.   Gastrointestinal: Negative.   Genitourinary: Negative.   Musculoskeletal: Negative.   Skin: Negative.   Neurological: Negative.   Endo/Heme/Allergies: Negative.   Psychiatric/Behavioral: Negative.   LONG TERM CONSTIPATION, BM Q 3 DAYS  PHYSICAL EXAMINATION:    BP 140/82  (BP Location: Right Arm, Patient Position: Sitting, Cuff Size: Normal)   Pulse 72   Wt 164 lb 6.4 oz (74.6 kg)   LMP 09/23/2012   BMI 28.67 kg/m     General appearance: alert, cooperative and appears stated age Neck: no adenopathy, supple, symmetrical, trachea midline and thyroid normal to inspection and palpation Heart: regular rate and rhythm Lungs: CTAB Abdomen: soft, non-tender; bowel sounds normal; no masses,  no organomegaly Extremities: normal, atraumatic, no cyanosis Skin: normal color, texture and turgor, no rashes or lesions Lymph: normal cervical supraclavicular and inguinal nodes Neurologic: grossly normal     ASSESSMENT Complex left ovarian mass, slight growth in the last 5 years. No ascites, no lymphadenopathy, normal CA 125 Endometrial polyp Strong FH of cancer including uterine cancer Rectocele HTN Achalasia of her esophagus, severe reflux issues Arthritis, back and neck issues. Will position awake in the OR    PLAN We discussed hysteroscopy, polypectomy, D&C with laparoscopic BSO and rectocele repair. She strongly desires hysterectomy secondary to her concern of uterine cancer and would like to have that at the time of her laparoscopic BSO. She understands that if she has any cancer in the polyp she could potentially end up with another surgery. Will plan TLH/BSO/rectocele repair/cystoscopy We discussed the risks of surgery, including but not limited to: bleeding, infection, damage to bowel/bladder/vessels/ureters, need for further surgery. We also discussed the risk of dyspareunia, recurrence of prolapse and risk of vaginal cuff dehiscence.  She will need an OG tube in the OR and an Anesthesia consult prior to surgery She will start the estrace cream now Stop Evista now    An After Visit Summary was printed and given to the patient.  Over 25 minutes face to face time of which over 50% was spent in counseling.   CC: Dr [name]

## 2018-07-06 NOTE — Progress Notes (Signed)
05/22/2018- noted in [place] abd. Pelvis w/contrast  10/28/2017- noted in Epic- EKG

## 2018-07-06 NOTE — Patient Instructions (Addendum)
Your procedure is scheduled on   Tuesday 07/14/2018  Report to [facility] M.   Call this number if you have problems the morning of surgery  :[phone].   OUR ADDRESS IS [address].  WE ARE LOCATED IN [facility].                                     REMEMBER:  DO NOT EAT FOOD OR DRINK LIQUIDS AFTER MIDNIGHT .    TAKE THESE MEDICATIONS MORNING OF SURGERY WITH A SIP OF WATER: Bupropion (Wellbutrin XL), Esomeprazole (Nexium), Venlafaxine XR(Effexor), use Flonase nasal spray, use Advair inhaler if needed and bring inhaler with you to hospital.                                       DO NOT WEAR JEWERLY, MAKE UP, OR NAIL POLISH,  DO NOT WEAR LOTIONS, POWDERS, PERFUMES OR DEODORANT. DO NOT SHAVE FOR 24 HOURS PRIOR TO DAY OF SURGERY.  CONTACTS, GLASSES, OR DENTURES MAY NOT BE WORN TO SURGERY.                                    [facility] IS NOT RESPONSIBLE  FOR ANY BELONGINGS.                                                                    Marland Kitchen                                                                                                    [facility] - Preparing for Surgery Before surgery, you can play an important role.  Because skin is not sterile, your skin needs to be as free of germs as possible.  You can reduce the number of germs on your skin by washing with CHG (chlorahexidine gluconate) soap before surgery.  CHG is an antiseptic cleaner which kills germs and bonds with the skin to continue killing germs even after washing. Please DO NOT use if you have an allergy to CHG or antibacterial soaps.  If your skin becomes reddened/irritated stop using the CHG and inform your nurse when you arrive at Short Stay. Do not shave (including legs and underarms) for at least 48 hours prior to the first CHG shower.  You may shave your face/neck. Please follow these instructions carefully:  1.  Shower with CHG Soap the night before  surgery and the  morning of Surgery.  2.  If you choose to wash your hair, wash your hair first as  usual with your  normal  shampoo.  3.  After you shampoo, rinse your hair and body thoroughly to remove the  shampoo.                           4.  Use CHG as you would any other liquid soap.  You can apply chg directly  to the skin and wash                       Gently with a scrungie or clean washcloth.  5.  Apply the CHG Soap to your body ONLY FROM THE NECK DOWN.   Do not use on face/ open                           Wound or open sores. Avoid contact with eyes, ears mouth and genitals (private parts).                       Wash face,  Genitals (private parts) with your normal soap.             6.  Wash thoroughly, paying special attention to the area where your surgery  will be performed.  7.  Thoroughly rinse your body with warm water from the neck down.  8.  DO NOT shower/wash with your normal soap after using and rinsing off  the CHG Soap.                9.  Pat yourself dry with a clean towel.            10.  Wear clean pajamas.            11.  Place clean sheets on your bed the night of your first shower and do not  sleep with pets. Day of Surgery : Do not apply any lotions/deodorants the morning of surgery.  Please wear clean clothes to the hospital/surgery center.  FAILURE TO FOLLOW THESE INSTRUCTIONS MAY RESULT IN THE CANCELLATION OF YOUR SURGERY PATIENT SIGNATURE_________________________________  NURSE SIGNATURE__________________________________  ________________________________________________________________________   Beth Peters  An incentive spirometer is a tool that can help keep your lungs clear and active. This tool measures how well you are filling your lungs with each breath. Taking long deep breaths may help reverse or decrease the chance of developing breathing (pulmonary) problems (especially infection) following:  A long period of time when you are unable to  move or be active. BEFORE THE PROCEDURE   If the spirometer includes an indicator to show your best effort, your nurse or respiratory therapist will set it to a desired goal.  If possible, sit up straight or lean slightly forward. Try not to slouch.  Hold the incentive spirometer in an upright position. INSTRUCTIONS FOR USE  1. Sit on the edge of your bed if possible, or sit up as far as you can in bed or on a chair. 2. Hold the incentive spirometer in an upright position. 3. Breathe out normally. 4. Place the mouthpiece in your mouth and seal your lips tightly around it. 5. Breathe in slowly and as deeply as possible, raising the piston or the ball toward the top of the column. 6. Hold your breath for 3-5 seconds or for as long as possible. Allow the piston or ball to fall to the bottom of the column. 7. Remove the mouthpiece from  your mouth and breathe out normally. 8. Rest for a few seconds and repeat Steps 1 through 7 at least 10 times every 1-2 hours when you are awake. Take your time and take a few normal breaths between deep breaths. 9. The spirometer may include an indicator to show your best effort. Use the indicator as a goal to work toward during each repetition. 10. After each set of 10 deep breaths, practice coughing to be sure your lungs are clear. If you have an incision (the cut made at the time of surgery), support your incision when coughing by placing a pillow or rolled up towels firmly against it. Once you are able to get out of bed, walk around indoors and cough well. You may stop using the incentive spirometer when instructed by your caregiver.  RISKS AND COMPLICATIONS  Take your time so you do not get dizzy or light-headed.  If you are in pain, you may need to take or ask for pain medication before doing incentive spirometry. It is harder to take a deep breath if you are having pain. AFTER USE  Rest and breathe slowly and easily.  It can be helpful to keep track of  a log of your progress. Your caregiver can provide you with a simple table to help with this. If you are using the spirometer at home, follow these instructions: [place] IF:   You are having difficultly using the spirometer.  You have trouble using the spirometer as often as instructed.  Your pain medication is not giving enough relief while using the spirometer.  You develop fever of 100.5 F (38.1 C) or higher. SEEK IMMEDIATE MEDICAL CARE IF:   You cough up bloody sputum that had not been present before.  You develop fever of 102 F (38.9 C) or greater.  You develop worsening pain at or near the incision site. MAKE SURE YOU:   Understand these instructions.  Will watch your condition.  Will get help right away if you are not doing well or get worse. Document Released: 11/11/2006 Document Revised: 09/23/2011 Document Reviewed: 01/12/2007 [facility] Patient Information 2014 [place], [place].   ________________________________________________________________________

## 2018-07-09 ENCOUNTER — Other Ambulatory Visit: Payer: Self-pay

## 2018-07-09 ENCOUNTER — Encounter (HOSPITAL_COMMUNITY)
Admission: RE | Admit: 2018-07-09 | Discharge: 2018-07-09 | Disposition: A | Discharge status: Home / Self Care | Payer: PPO | Source: Ambulatory Visit | Attending: Obstetrics and Gynecology | Admitting: Obstetrics and Gynecology

## 2018-07-09 ENCOUNTER — Encounter (HOSPITAL_COMMUNITY): Payer: Self-pay

## 2018-07-09 DIAGNOSIS — Z01812 Encounter for preprocedural laboratory examination: Secondary | ICD-10-CM | POA: Insufficient documentation

## 2018-07-09 LAB — COMPREHENSIVE METABOLIC PANEL
ALT: 25 U/L (ref 0–44)
AST: 25 U/L (ref 15–41)
Albumin: 4.1 g/dL (ref 3.5–5.0)
Alkaline Phosphatase: 58 U/L (ref 38–126)
Anion gap: 8 (ref 5–15)
BILIRUBIN TOTAL: 0.7 mg/dL (ref 0.3–1.2)
BUN: 11 mg/dL (ref 8–23)
CO2: 30 mmol/L (ref 22–32)
Calcium: 9.4 mg/dL (ref 8.9–10.3)
Chloride: 103 mmol/L (ref 98–111)
Creatinine, Ser: 0.85 mg/dL (ref 0.44–1.00)
GFR calc Af Amer: >60 mL/min (ref >60)
GFR calc non Af Amer: >60 mL/min (ref >60)
GLUCOSE: 103 mg/dL — AB (ref 70–99)
Potassium: 3.7 mmol/L (ref 3.5–5.1)
Sodium: 141 mmol/L (ref 135–145)
TOTAL PROTEIN: 6.7 g/dL (ref 6.5–8.1)

## 2018-07-09 LAB — CBC
HCT: 45.7 % (ref 36.0–46.0)
Hemoglobin: 15 g/dL (ref 12.0–15.0)
MCH: 30.8 pg (ref 26.0–34.0)
MCHC: 32.8 g/dL (ref 30.0–36.0)
MCV: 93.8 fL (ref 80.0–100.0)
Platelets: 211 10*3/uL (ref 150–400)
RBC: 4.87 MIL/uL (ref 3.87–5.11)
RDW: 13 % (ref 11.5–15.5)
WBC: 5.3 10*3/uL (ref 4.0–10.5)
nRBC: 0 % (ref 0.0–0.2)

## 2018-07-09 NOTE — Anesthesia Preprocedure Evaluation (Addendum)
Anesthesia Evaluation  Patient identified by MRN, date of birth, ID band Patient awake    Reviewed: Allergy & Precautions, NPO status , Patient's Chart, lab work & pertinent test results  History of Anesthesia Complications (+) PONV and Family history of anesthesia reaction  Airway Mallampati: II  TM Distance: >3 FB Neck ROM: Full    Dental no notable dental hx. (+) Teeth Intact, Dental Advisory Given   Pulmonary asthma ,    Pulmonary exam normal breath sounds clear to auscultation       Cardiovascular hypertension, Pt. on medications Normal cardiovascular exam Rhythm:Regular Rate:Normal     Neuro/Psych Anxiety  Neuromuscular disease    GI/Hepatic Neg liver ROS, hiatal hernia, GERD  Medicated,Pt w hx of achalasia   Endo/Other  negative endocrine ROS  Renal/GU negative Renal ROS     Musculoskeletal  (+) Arthritis ,   Abdominal   Peds  Hematology  (+) Blood dyscrasia, anemia ,   Anesthesia Other Findings Talked with patient on 12/26 concerning aspiration risks related to positioning of surgery(Trendelenburg) and patient h/o Heiller pyloromyotomy. She should receive prior to induction an antisialagogue. She was told she will have an appropriately sized cuffed endo tracheal tube and at the end of the case will have her oropharynx suctioned   Reproductive/Obstetrics                           Lab Results  Component Value Date   CREATININE 0.85 07/09/2018   BUN 11 07/09/2018   NA 141 07/09/2018   K 3.7 07/09/2018   CL 103 07/09/2018   CO2 30 07/09/2018    Lab Results  Component Value Date   WBC 5.3 07/09/2018   HGB 15.0 07/09/2018   HCT 45.7 07/09/2018   MCV 93.8 07/09/2018   PLT 211 07/09/2018    Anesthesia Physical Anesthesia Plan  ASA: III  Anesthesia Plan: General   Post-op Pain Management:    Induction: Intravenous, Rapid sequence and Cricoid pressure planned  PONV Risk  Score and Plan: 4 or greater and Treatment may vary due to age or medical condition, Ondansetron, Dexamethasone, Midazolam and Scopolamine patch - Pre-op  Airway Management Planned: Oral ETT  Additional Equipment:   Intra-op Plan:   Post-operative Plan: Extubation in OR  Informed Consent: I have reviewed the patients History and Physical, chart, labs and discussed the procedure including the risks, benefits and alternatives for the proposed anesthesia with the patient or authorized representative who has indicated his/her understanding and acceptance.   Dental advisory given  Plan Discussed with:   Anesthesia Plan Comments:        Anesthesia Quick Evaluation

## 2018-07-09 NOTE — Progress Notes (Signed)
Patient wanted to talk to Anesthesia and patient talked to Dr. Lyn Hollingshead via phone.

## 2018-07-14 ENCOUNTER — Encounter (HOSPITAL_COMMUNITY)
Admission: RE | Disposition: A | Discharge status: Home / Self Care | Payer: Self-pay | Source: Home / Self Care | Attending: Obstetrics and Gynecology

## 2018-07-14 ENCOUNTER — Ambulatory Visit (HOSPITAL_BASED_OUTPATIENT_CLINIC_OR_DEPARTMENT_OTHER): Payer: PPO | Admitting: Anesthesiology

## 2018-07-14 ENCOUNTER — Ambulatory Visit (HOSPITAL_BASED_OUTPATIENT_CLINIC_OR_DEPARTMENT_OTHER)
Admission: RE | Admit: 2018-07-14 | Discharge: 2018-07-15 | Disposition: A | Discharge status: Home / Self Care | Payer: PPO | Attending: Obstetrics and Gynecology | Admitting: Obstetrics and Gynecology

## 2018-07-14 ENCOUNTER — Encounter (HOSPITAL_BASED_OUTPATIENT_CLINIC_OR_DEPARTMENT_OTHER): Payer: Self-pay

## 2018-07-14 ENCOUNTER — Other Ambulatory Visit: Payer: Self-pay

## 2018-07-14 DIAGNOSIS — J45909 Unspecified asthma, uncomplicated: Secondary | ICD-10-CM | POA: Diagnosis not present

## 2018-07-14 DIAGNOSIS — Z803 Family history of malignant neoplasm of breast: Secondary | ICD-10-CM | POA: Diagnosis not present

## 2018-07-14 DIAGNOSIS — N83292 Other ovarian cyst, left side: Secondary | ICD-10-CM

## 2018-07-14 DIAGNOSIS — K219 Gastro-esophageal reflux disease without esophagitis: Secondary | ICD-10-CM | POA: Insufficient documentation

## 2018-07-14 DIAGNOSIS — N839 Noninflammatory disorder of ovary, fallopian tube and broad ligament, unspecified: Secondary | ICD-10-CM | POA: Diagnosis not present

## 2018-07-14 DIAGNOSIS — Z79899 Other long term (current) drug therapy: Secondary | ICD-10-CM | POA: Diagnosis not present

## 2018-07-14 DIAGNOSIS — I10 Essential (primary) hypertension: Secondary | ICD-10-CM | POA: Diagnosis not present

## 2018-07-14 DIAGNOSIS — N816 Rectocele: Secondary | ICD-10-CM | POA: Insufficient documentation

## 2018-07-14 DIAGNOSIS — Z885 Allergy status to narcotic agent status: Secondary | ICD-10-CM | POA: Insufficient documentation

## 2018-07-14 DIAGNOSIS — K220 Achalasia of cardia: Secondary | ICD-10-CM | POA: Diagnosis not present

## 2018-07-14 DIAGNOSIS — D259 Leiomyoma of uterus, unspecified: Secondary | ICD-10-CM | POA: Insufficient documentation

## 2018-07-14 DIAGNOSIS — N840 Polyp of corpus uteri: Secondary | ICD-10-CM

## 2018-07-14 DIAGNOSIS — N83202 Unspecified ovarian cyst, left side: Secondary | ICD-10-CM | POA: Diagnosis not present

## 2018-07-14 DIAGNOSIS — Z8049 Family history of malignant neoplasm of other genital organs: Secondary | ICD-10-CM

## 2018-07-14 DIAGNOSIS — D271 Benign neoplasm of left ovary: Secondary | ICD-10-CM | POA: Diagnosis not present

## 2018-07-14 DIAGNOSIS — R8569 Abnormal cytological findings in specimens from other digestive organs and abdominal cavity: Secondary | ICD-10-CM | POA: Diagnosis not present

## 2018-07-14 DIAGNOSIS — Z90710 Acquired absence of both cervix and uterus: Secondary | ICD-10-CM | POA: Diagnosis present

## 2018-07-14 HISTORY — PX: RECTOCELE REPAIR: SHX761

## 2018-07-14 HISTORY — PX: CYSTOSCOPY: SHX5120

## 2018-07-14 LAB — CBC
HCT: 42.1 % (ref 36.0–46.0)
HEMOGLOBIN: 14 g/dL (ref 12.0–15.0)
MCH: 30.9 pg (ref 26.0–34.0)
MCHC: 33.3 g/dL (ref 30.0–36.0)
MCV: 92.9 fL (ref 80.0–100.0)
Platelets: 174 10*3/uL (ref 150–400)
RBC: 4.53 MIL/uL (ref 3.87–5.11)
RDW: 12.7 % (ref 11.5–15.5)
WBC: 10.8 10*3/uL — ABNORMAL HIGH (ref 4.0–10.5)
nRBC: 0 % (ref 0.0–0.2)

## 2018-07-14 SURGERY — HYSTERECTOMY, TOTAL, LAPAROSCOPIC, WITH BILATERAL SALPINGO-OOPHORECTOMY
Anesthesia: General

## 2018-07-14 MED ORDER — FENTANYL CITRATE (PF) 100 MCG/2ML IJ SOLN
INTRAMUSCULAR | Status: AC
  Filled 2018-07-14: qty 2

## 2018-07-14 MED ORDER — ONDANSETRON HCL 4 MG/2ML IJ SOLN
INTRAMUSCULAR | Status: AC
  Filled 2018-07-14: qty 2

## 2018-07-14 MED ORDER — ONDANSETRON HCL 4 MG/2ML IJ SOLN
INTRAMUSCULAR | Status: DC | PRN
  Administered 2018-07-14: 4 mg via INTRAVENOUS

## 2018-07-14 MED ORDER — ESTRADIOL 0.1 MG/GM VA CREA
TOPICAL_CREAM | VAGINAL | Status: DC | PRN
  Administered 2018-07-14: 1 via VAGINAL

## 2018-07-14 MED ORDER — CEFAZOLIN SODIUM-DEXTROSE 2-4 GM/100ML-% IV SOLN
INTRAVENOUS | Status: AC
  Filled 2018-07-14: qty 100

## 2018-07-14 MED ORDER — ACETAMINOPHEN 10 MG/ML IV SOLN
INTRAVENOUS | Status: AC
  Filled 2018-07-14: qty 100

## 2018-07-14 MED ORDER — PROPOFOL 10 MG/ML IV BOLUS
INTRAVENOUS | Status: DC | PRN
  Administered 2018-07-14: 150 mg via INTRAVENOUS

## 2018-07-14 MED ORDER — KETOROLAC TROMETHAMINE 30 MG/ML IJ SOLN
INTRAMUSCULAR | Status: DC | PRN
  Administered 2018-07-14: 30 mg via INTRAVENOUS

## 2018-07-14 MED ORDER — ARTIFICIAL TEARS OPHTHALMIC OINT
TOPICAL_OINTMENT | OPHTHALMIC | Status: AC
  Filled 2018-07-14: qty 3.5

## 2018-07-14 MED ORDER — VENLAFAXINE HCL ER 37.5 MG PO CP24
37.5000 mg | ORAL_CAPSULE | Freq: Every day | ORAL | Status: DC
  Administered 2018-07-15: 37.5 mg via ORAL
  Filled 2018-07-14 (×2): qty 1

## 2018-07-14 MED ORDER — DEXAMETHASONE SODIUM PHOSPHATE 10 MG/ML IJ SOLN
INTRAMUSCULAR | Status: DC | PRN
  Administered 2018-07-14: 5 mg via INTRAVENOUS

## 2018-07-14 MED ORDER — DOCUSATE SODIUM 100 MG PO CAPS
100.0000 mg | ORAL_CAPSULE | Freq: Two times a day (BID) | ORAL | Status: DC
  Administered 2018-07-14 – 2018-07-15 (×2): 100 mg via ORAL
  Filled 2018-07-14 (×2): qty 1

## 2018-07-14 MED ORDER — HYDROCHLOROTHIAZIDE 12.5 MG PO CAPS
12.5000 mg | ORAL_CAPSULE | Freq: Every morning | ORAL | Status: DC
  Administered 2018-07-15: 12.5 mg via ORAL
  Filled 2018-07-14: qty 1

## 2018-07-14 MED ORDER — ACETAMINOPHEN 500 MG PO TABS
ORAL_TABLET | ORAL | Status: AC
  Filled 2018-07-14: qty 2

## 2018-07-14 MED ORDER — ONDANSETRON HCL 4 MG PO TABS: 4.0000 mg | ORAL_TABLET | Freq: Four times a day (QID) | ORAL | Status: DC | PRN

## 2018-07-14 MED ORDER — MORPHINE SULFATE (PF) 2 MG/ML IV SOLN
1.0000 mg | INTRAVENOUS | Status: DC | PRN
  Administered 2018-07-14: 2 mg via INTRAVENOUS
  Filled 2018-07-14: qty 1

## 2018-07-14 MED ORDER — ROCURONIUM BROMIDE 10 MG/ML (PF) SYRINGE
PREFILLED_SYRINGE | INTRAVENOUS | Status: AC
  Filled 2018-07-14: qty 10

## 2018-07-14 MED ORDER — GABAPENTIN 400 MG PO CAPS
400.0000 mg | ORAL_CAPSULE | Freq: Every day | ORAL | Status: DC
  Administered 2018-07-14: 400 mg via ORAL
  Filled 2018-07-14: qty 1

## 2018-07-14 MED ORDER — ACETAMINOPHEN 10 MG/ML IV SOLN
1000.0000 mg | Freq: Once | INTRAVENOUS | Status: AC
  Administered 2018-07-14: 1000 mg via INTRAVENOUS
  Filled 2018-07-14: qty 100

## 2018-07-14 MED ORDER — SUCCINYLCHOLINE CHLORIDE 200 MG/10ML IV SOSY
PREFILLED_SYRINGE | INTRAVENOUS | Status: DC | PRN
  Administered 2018-07-14: 100 mg via INTRAVENOUS

## 2018-07-14 MED ORDER — KETOROLAC TROMETHAMINE 30 MG/ML IJ SOLN
INTRAMUSCULAR | Status: AC
  Filled 2018-07-14: qty 1

## 2018-07-14 MED ORDER — ONDANSETRON HCL 4 MG/2ML IJ SOLN
4.0000 mg | Freq: Once | INTRAMUSCULAR | Status: DC | PRN
  Filled 2018-07-14: qty 2

## 2018-07-14 MED ORDER — ACETAMINOPHEN 500 MG PO TABS
1000.0000 mg | ORAL_TABLET | Freq: Once | ORAL | Status: DC
  Filled 2018-07-14: qty 2

## 2018-07-14 MED ORDER — PANTOPRAZOLE SODIUM 40 MG PO PACK
40.0000 mg | PACK | Freq: Every day | ORAL | Status: DC
  Administered 2018-07-14 – 2018-07-15 (×2): 40 mg via ORAL
  Filled 2018-07-14 (×2): qty 20

## 2018-07-14 MED ORDER — SODIUM CHLORIDE 0.9 % IR SOLN
Status: DC | PRN
  Administered 2018-07-14: 1000 mL via INTRAVESICAL
  Administered 2018-07-14: 1000 mL

## 2018-07-14 MED ORDER — LACTATED RINGERS IV SOLN
INTRAVENOUS | Status: DC
  Administered 2018-07-14 (×2): via INTRAVENOUS
  Filled 2018-07-14: qty 1000

## 2018-07-14 MED ORDER — DEXAMETHASONE SODIUM PHOSPHATE 10 MG/ML IJ SOLN
INTRAMUSCULAR | Status: AC
  Filled 2018-07-14: qty 1

## 2018-07-14 MED ORDER — GABAPENTIN 300 MG PO CAPS
ORAL_CAPSULE | ORAL | Status: AC
  Filled 2018-07-14: qty 1

## 2018-07-14 MED ORDER — LIDOCAINE 2% (20 MG/ML) 5 ML SYRINGE
INTRAMUSCULAR | Status: DC | PRN
  Administered 2018-07-14: 100 mg via INTRAVENOUS

## 2018-07-14 MED ORDER — METOCLOPRAMIDE HCL 5 MG/ML IJ SOLN
INTRAMUSCULAR | Status: AC
  Filled 2018-07-14: qty 2

## 2018-07-14 MED ORDER — LIDOCAINE-EPINEPHRINE 1 %-1:100000 IJ SOLN
INTRAMUSCULAR | Status: DC | PRN
  Administered 2018-07-14: 10 mL

## 2018-07-14 MED ORDER — ENOXAPARIN SODIUM 40 MG/0.4ML ~~LOC~~ SOLN
SUBCUTANEOUS | Status: AC
  Filled 2018-07-14: qty 0.4

## 2018-07-14 MED ORDER — BUPROPION HCL ER (XL) 150 MG PO TB24
150.0000 mg | ORAL_TABLET | Freq: Every day | ORAL | Status: DC
  Administered 2018-07-14 – 2018-07-15 (×2): 150 mg via ORAL
  Filled 2018-07-14 (×2): qty 1

## 2018-07-14 MED ORDER — HYDROCODONE-ACETAMINOPHEN 7.5-325 MG PO TABS
1.0000 | ORAL_TABLET | Freq: Once | ORAL | Status: DC | PRN
  Filled 2018-07-14: qty 1

## 2018-07-14 MED ORDER — ACETAMINOPHEN 160 MG/5ML PO SOLN: 650.0000 mg | ORAL | Status: DC | PRN

## 2018-07-14 MED ORDER — FENTANYL CITRATE (PF) 250 MCG/5ML IJ SOLN
INTRAMUSCULAR | Status: AC
  Filled 2018-07-14: qty 5

## 2018-07-14 MED ORDER — HYDROCODONE-ACETAMINOPHEN 7.5-325 MG/15ML PO SOLN
15.0000 mL | Freq: Once | ORAL | Status: AC | PRN
  Administered 2018-07-14: 15 mL via ORAL
  Filled 2018-07-14 (×2): qty 15

## 2018-07-14 MED ORDER — CEFAZOLIN SODIUM-DEXTROSE 2-4 GM/100ML-% IV SOLN
2.0000 g | INTRAVENOUS | Status: AC
  Administered 2018-07-14: 2 g via INTRAVENOUS
  Filled 2018-07-14: qty 100

## 2018-07-14 MED ORDER — ALUM & MAG HYDROXIDE-SIMETH 200-200-20 MG/5ML PO SUSP: 30.0000 mL | ORAL | Status: DC | PRN

## 2018-07-14 MED ORDER — LIDOCAINE 2% (20 MG/ML) 5 ML SYRINGE
INTRAMUSCULAR | Status: AC
  Filled 2018-07-14: qty 5

## 2018-07-14 MED ORDER — ROCURONIUM BROMIDE 10 MG/ML (PF) SYRINGE
PREFILLED_SYRINGE | INTRAVENOUS | Status: DC | PRN
  Administered 2018-07-14: 10 mg via INTRAVENOUS
  Administered 2018-07-14: 30 mg via INTRAVENOUS

## 2018-07-14 MED ORDER — SUGAMMADEX SODIUM 200 MG/2ML IV SOLN
INTRAVENOUS | Status: AC
  Filled 2018-07-14: qty 2

## 2018-07-14 MED ORDER — SCOPOLAMINE 1 MG/3DAYS TD PT72
1.0000 | MEDICATED_PATCH | TRANSDERMAL | Status: DC
  Administered 2018-07-14: 1.5 mg via TRANSDERMAL
  Filled 2018-07-14: qty 1

## 2018-07-14 MED ORDER — GABAPENTIN 300 MG PO CAPS
300.0000 mg | ORAL_CAPSULE | Freq: Once | ORAL | Status: AC
  Administered 2018-07-13: 300 mg via ORAL
  Filled 2018-07-14: qty 1

## 2018-07-14 MED ORDER — BUPIVACAINE HCL (PF) 0.25 % IJ SOLN
INTRAMUSCULAR | Status: DC | PRN
  Administered 2018-07-14: 13 mL

## 2018-07-14 MED ORDER — KCL IN DEXTROSE-NACL 20-5-0.45 MEQ/L-%-% IV SOLN
INTRAVENOUS | Status: DC
  Administered 2018-07-14: 14:00:00 via INTRAVENOUS
  Filled 2018-07-14: qty 1000

## 2018-07-14 MED ORDER — ENOXAPARIN SODIUM 40 MG/0.4ML ~~LOC~~ SOLN
40.0000 mg | SUBCUTANEOUS | Status: AC
  Administered 2018-07-14: 40 mg via SUBCUTANEOUS
  Filled 2018-07-14: qty 0.4

## 2018-07-14 MED ORDER — ENOXAPARIN SODIUM 40 MG/0.4ML ~~LOC~~ SOLN: 40.0000 mg | SUBCUTANEOUS | Status: DC

## 2018-07-14 MED ORDER — FAMOTIDINE IN NACL 20-0.9 MG/50ML-% IV SOLN
20.0000 mg | Freq: Once | INTRAVENOUS | Status: DC
  Filled 2018-07-14: qty 50

## 2018-07-14 MED ORDER — MENTHOL 3 MG MT LOZG: 1.0000 | LOZENGE | OROMUCOSAL | Status: DC | PRN

## 2018-07-14 MED ORDER — FENTANYL CITRATE (PF) 100 MCG/2ML IJ SOLN
INTRAMUSCULAR | Status: DC | PRN
  Administered 2018-07-14 (×4): 50 ug via INTRAVENOUS

## 2018-07-14 MED ORDER — METOCLOPRAMIDE HCL 5 MG/ML IJ SOLN
INTRAMUSCULAR | Status: DC | PRN
  Administered 2018-07-14: 10 mg via INTRAVENOUS

## 2018-07-14 MED ORDER — MIDAZOLAM HCL 2 MG/2ML IJ SOLN
INTRAMUSCULAR | Status: AC
  Filled 2018-07-14: qty 2

## 2018-07-14 MED ORDER — SUGAMMADEX SODIUM 200 MG/2ML IV SOLN
INTRAVENOUS | Status: DC | PRN
  Administered 2018-07-14: 150 mg via INTRAVENOUS

## 2018-07-14 MED ORDER — ONDANSETRON HCL 4 MG/2ML IJ SOLN: 4.0000 mg | Freq: Four times a day (QID) | INTRAMUSCULAR | Status: DC | PRN

## 2018-07-14 MED ORDER — MIDAZOLAM HCL 2 MG/2ML IJ SOLN
INTRAMUSCULAR | Status: DC | PRN
  Administered 2018-07-14: 2 mg via INTRAVENOUS

## 2018-07-14 MED ORDER — ROPIVACAINE HCL 5 MG/ML IJ SOLN
INTRAMUSCULAR | Status: DC | PRN
  Administered 2018-07-14: 30 mL

## 2018-07-14 MED ORDER — OXYCODONE HCL 5 MG/5ML PO SOLN
5.0000 mg | ORAL | Status: DC | PRN
  Administered 2018-07-14 – 2018-07-15 (×2): 5 mg via ORAL
  Filled 2018-07-14 (×2): qty 5

## 2018-07-14 MED ORDER — PROPOFOL 10 MG/ML IV BOLUS
INTRAVENOUS | Status: AC
  Filled 2018-07-14: qty 40

## 2018-07-14 MED ORDER — GLYCOPYRROLATE PF 0.2 MG/ML IJ SOSY
PREFILLED_SYRINGE | INTRAMUSCULAR | Status: DC | PRN
  Administered 2018-07-14: .2 mg via INTRAVENOUS

## 2018-07-14 MED ORDER — FENTANYL CITRATE (PF) 100 MCG/2ML IJ SOLN
25.0000 ug | INTRAMUSCULAR | Status: DC | PRN
  Administered 2018-07-14 (×2): 25 ug via INTRAVENOUS
  Filled 2018-07-14: qty 1

## 2018-07-14 MED ORDER — KETOROLAC TROMETHAMINE 15 MG/ML IJ SOLN
15.0000 mg | Freq: Three times a day (TID) | INTRAMUSCULAR | Status: DC
  Administered 2018-07-14 – 2018-07-15 (×2): 15 mg via INTRAVENOUS
  Filled 2018-07-14 (×2): qty 1

## 2018-07-14 MED ORDER — FLUTICASONE PROPIONATE 50 MCG/ACT NA SUSP
2.0000 | Freq: Every day | NASAL | Status: DC
  Administered 2018-07-15: 2 via NASAL
  Filled 2018-07-14: qty 16

## 2018-07-14 MED ORDER — SCOPOLAMINE 1 MG/3DAYS TD PT72
MEDICATED_PATCH | TRANSDERMAL | Status: AC
  Filled 2018-07-14: qty 1

## 2018-07-14 MED ORDER — HYDROCODONE-ACETAMINOPHEN 7.5-325 MG/15ML PO SOLN
10.0000 mL | Freq: Once | ORAL | Status: DC | PRN
  Filled 2018-07-14 (×2): qty 15

## 2018-07-14 MED ORDER — SODIUM CHLORIDE (PF) 0.9 % IJ SOLN
INTRAMUSCULAR | Status: DC | PRN
  Administered 2018-07-14: 30 mL

## 2018-07-14 MED ORDER — MEPERIDINE HCL 25 MG/ML IJ SOLN
6.2500 mg | INTRAMUSCULAR | Status: DC | PRN
  Filled 2018-07-14: qty 1

## 2018-07-14 MED ORDER — GLYCOPYRROLATE PF 0.2 MG/ML IJ SOSY
PREFILLED_SYRINGE | INTRAMUSCULAR | Status: AC
  Filled 2018-07-14: qty 1

## 2018-07-14 MED ORDER — MOMETASONE FURO-FORMOTEROL FUM 200-5 MCG/ACT IN AERO
2.0000 | INHALATION_SPRAY | Freq: Two times a day (BID) | RESPIRATORY_TRACT | Status: DC
  Filled 2018-07-14: qty 8.8

## 2018-07-14 SURGICAL SUPPLY — 93 items
ADH SKN CLS APL DERMABOND .7 (GAUZE/BANDAGES/DRESSINGS) ×2
AGENT HMST KT MTR STRL THRMB (HEMOSTASIS)
APL SRG 38 LTWT LNG FL B (MISCELLANEOUS) ×2
APPLICATOR ARISTA FLEXITIP XL (MISCELLANEOUS) ×1 IMPLANT
BLADE SURG 10 STRL SS (BLADE) IMPLANT
BLADE SURG 15 STRL LF DISP TIS (BLADE) IMPLANT
BLADE SURG 15 STRL SS (BLADE) ×3
CABLE HIGH FREQUENCY MONO STRZ (ELECTRODE) IMPLANT
CANISTER SUCT 3000ML PPV (MISCELLANEOUS) ×3 IMPLANT
CELL SAVER LIPIGURD (MISCELLANEOUS) IMPLANT
CLOTH BEACON ORANGE TIMEOUT ST (SAFETY) ×3 IMPLANT
CONT PATH 16OZ SNAP LID 3702 (MISCELLANEOUS) IMPLANT
COVER MAYO STAND STRL (DRAPES) ×3 IMPLANT
COVER TABLE BACK 60X90 (DRAPES) IMPLANT
COVER WAND RF STERILE (DRAPES) ×6 IMPLANT
DECANTER SPIKE VIAL GLASS SM (MISCELLANEOUS) ×6 IMPLANT
DERMABOND ADVANCED (GAUZE/BANDAGES/DRESSINGS) ×1
DERMABOND ADVANCED .7 DNX12 (GAUZE/BANDAGES/DRESSINGS) ×2 IMPLANT
DEVICE RETRIEVAL ALEXIS 14 (MISCELLANEOUS) IMPLANT
DRSG COVADERM PLUS 2X2 (GAUZE/BANDAGES/DRESSINGS) ×9 IMPLANT
DRSG OPSITE POSTOP 3X4 (GAUZE/BANDAGES/DRESSINGS) ×3 IMPLANT
DURAPREP 26ML APPLICATOR (WOUND CARE) ×3 IMPLANT
EXTRT SYSTEM ALEXIS 14CM (MISCELLANEOUS)
EXTRT SYSTEM ALEXIS 17CM (MISCELLANEOUS)
GAUZE 4X4 16PLY RFD (DISPOSABLE) ×3 IMPLANT
GAUZE PACKING 2X5 YD STRL (GAUZE/BANDAGES/DRESSINGS) ×3 IMPLANT
GLOVE BIO SURGEON STRL SZ 6.5 (GLOVE) ×3 IMPLANT
GLOVE BIOGEL PI IND STRL 7.0 (GLOVE) ×8 IMPLANT
GLOVE BIOGEL PI INDICATOR 7.0 (GLOVE) ×4
GLOVE ECLIPSE 6.5 STRL STRAW (GLOVE) ×3 IMPLANT
GOWN STRL REUS W/TWL LRG LVL3 (GOWN DISPOSABLE) ×12 IMPLANT
HARMONIC RUM II 2.5CM SILVER (DISPOSABLE)
HARMONIC RUM II 3.0CM SILVER (DISPOSABLE)
HARMONIC RUM II 3.5CM SILVER (DISPOSABLE)
HARMONIC RUM II 4.0CM SILVER (DISPOSABLE)
HEMOSTAT ARISTA ABSORB 3G PWDR (MISCELLANEOUS) ×1 IMPLANT
LIGASURE VESSEL 5MM BLUNT TIP (ELECTROSURGICAL) ×3 IMPLANT
NEEDLE HYPO 22GX1.5 SAFETY (NEEDLE) ×3 IMPLANT
NEEDLE INSUFFLATION 120MM (ENDOMECHANICALS) ×3 IMPLANT
NS IRRIG 1000ML POUR BTL (IV SOLUTION) ×3 IMPLANT
PACK LAPAROSCOPY BASIN (CUSTOM PROCEDURE TRAY) ×3 IMPLANT
PACK TRENDGUARD 450 HYBRID PRO (MISCELLANEOUS) IMPLANT
PACK VAGINAL WOMENS (CUSTOM PROCEDURE TRAY) ×3 IMPLANT
POUCH LAPAROSCOPIC INSTRUMENT (MISCELLANEOUS) ×3 IMPLANT
PROTECTOR NERVE ULNAR (MISCELLANEOUS) ×6 IMPLANT
RETRACTOR WOUND ALXS 19CM XSML (INSTRUMENTS) IMPLANT
RTRCTR WOUND ALEXIS 19CM XSML (INSTRUMENTS)
RUMI II 3.0CM BLUE KOH-EFFICIE (DISPOSABLE) ×1 IMPLANT
SCALPEL HRMNC RUM II 2.5 SILVR (DISPOSABLE) IMPLANT
SCALPEL HRMNC RUM II 3.0 SILVR (DISPOSABLE) IMPLANT
SCALPEL HRMNC RUM II 3.5 SILVR (DISPOSABLE) IMPLANT
SCALPEL HRMNC RUM II 4.0 SILVR (DISPOSABLE) IMPLANT
SCISSORS LAP 5X35 DISP (ENDOMECHANICALS) IMPLANT
SET CYSTO W/LG BORE CLAMP LF (SET/KITS/TRAYS/PACK) ×3 IMPLANT
SET IRRIG TUBING LAPAROSCOPIC (IRRIGATION / IRRIGATOR) ×3 IMPLANT
SET IRRIG Y TYPE TUR BLADDER L (SET/KITS/TRAYS/PACK) ×1 IMPLANT
SET TRI-LUMEN FLTR TB AIRSEAL (TUBING) ×3 IMPLANT
SHEARS HARMONIC ACE PLUS 36CM (ENDOMECHANICALS) ×3 IMPLANT
SPONGE SURGIFOAM ABS GEL 100 (HEMOSTASIS) IMPLANT
SPONGE SURGIFOAM ABS GEL 12-7 (HEMOSTASIS) ×1 IMPLANT
SURGIFLO W/THROMBIN 8M KIT (HEMOSTASIS) IMPLANT
SUT MNCRL AB 4-0 PS2 18 (SUTURE) IMPLANT
SUT PDS AB 0 CT 36 (SUTURE) IMPLANT
SUT PDS AB 0 CT1 36 (SUTURE) IMPLANT
SUT PDS AB 3-0 SH 27 (SUTURE) IMPLANT
SUT VIC AB 0 CT1 18XCR BRD8 (SUTURE) IMPLANT
SUT VIC AB 0 CT1 36 (SUTURE) ×3 IMPLANT
SUT VIC AB 0 CT1 8-18 (SUTURE) ×3
SUT VIC AB 2-0 CT2 27 (SUTURE) ×1 IMPLANT
SUT VIC AB 2-0 SH 27 (SUTURE) ×6
SUT VIC AB 2-0 SH 27XBRD (SUTURE) ×4 IMPLANT
SUT VIC AB 3-0 PS2 18 (SUTURE) ×3
SUT VIC AB 3-0 PS2 18XBRD (SUTURE) ×2 IMPLANT
SUT VICRYL 0 UR6 27IN ABS (SUTURE) ×3 IMPLANT
SUT VICRYL 4-0 PS2 18IN ABS (SUTURE) ×3 IMPLANT
SUT VLOC 180 0 9IN  GS21 (SUTURE) ×1
SUT VLOC 180 0 9IN GS21 (SUTURE) IMPLANT
SYR 50ML LL SCALE MARK (SYRINGE) ×6 IMPLANT
SYSTEM CONTND EXTRCTN KII BLLN (MISCELLANEOUS) IMPLANT
TIP RUMI ORANGE 6.7MMX12CM (TIP) IMPLANT
TIP UTERINE 5.1X6CM LAV DISP (MISCELLANEOUS) ×1 IMPLANT
TIP UTERINE 6.7X10CM GRN DISP (MISCELLANEOUS) IMPLANT
TIP UTERINE 6.7X6CM WHT DISP (MISCELLANEOUS) IMPLANT
TIP UTERINE 6.7X8CM BLUE DISP (MISCELLANEOUS) IMPLANT
TOWEL OR 17X24 6PK STRL BLUE (TOWEL DISPOSABLE) ×6 IMPLANT
TRAY FOLEY BAG SILVER LF 14FR (CATHETERS) ×3 IMPLANT
TRAY FOLEY W/BAG SLVR 14FR (SET/KITS/TRAYS/PACK) ×2 IMPLANT
TRENDGUARD 450 HYBRID PRO PACK (MISCELLANEOUS) ×3
TROCAR ADV FIXATION 5X100MM (TROCAR) ×3 IMPLANT
TROCAR PORT AIRSEAL 5X120 (TROCAR) ×3 IMPLANT
TROCAR XCEL NON BLADE 8MM B8LT (ENDOMECHANICALS) ×3 IMPLANT
TROCAR XCEL NON-BLD 5MMX100MML (ENDOMECHANICALS) ×3 IMPLANT
WARMER LAPAROSCOPE (MISCELLANEOUS) ×3 IMPLANT

## 2018-07-14 NOTE — Discharge Instructions (Signed)

## 2018-07-14 NOTE — Progress Notes (Signed)
Day of Surgery Procedure(s) (LRB): TOTAL LAPAROSCOPIC HYSTERECTOMY WITH BILATERAL SALPINGO OOPHORECTOMY (Bilateral) POSTERIOR REPAIR (RECTOCELE) (N/A) CYSTOSCOPY (N/A)  Subjective: Patient reports that her pain has been fairly well controlled, feels ready for some more pain medication. Tolerating po, voiding and ambulating. She was having some moderate vaginal bleeding when going to the bathroom, seems to be improving. No blood on her pad  Objective: I have reviewed patient's vital signs, intake and output and labs.  Today's Vitals   07/14/18 1249 07/14/18 1400 07/14/18 1500 07/14/18 1600  BP: 134/78 137/80 133/78 133/74  Pulse: 86 82 74 76  Resp: 16 16 16 16   Temp: 97.8 F (36.6 C) 98 F (36.7 C) 98.5 F (36.9 C) 98.5 F (36.9 C)  TempSrc: Oral Oral Oral Oral  SpO2: 96% 99% 99% 100%  Weight:      Height:      PainSc:       No intake/output data recorded. Total I/O In: 2160 [P.O.:460; I.V.:1700] Out: 3552 [Urine:1525; Blood:25]   Lab Results  Component Value Date   WBC 10.8 (H) 07/14/2018   HGB 14.0 07/14/2018   HCT 42.1 07/14/2018   MCV 92.9 07/14/2018   PLT 174 07/14/2018     General: alert, cooperative and no distress Resp: clear to auscultation bilaterally Cardio: S1, S2 normal GI: soft, not tender to mild palpation, slightly distended, +BS Extremities: extremities normal, atraumatic, no cyanosis or edema Vaginal Bleeding: none on her pad Incisions: the left lower quadrant incision with significant ecchymosis, no induration, no drainage. Other incisions are clean, dry and intact without erythema.   Assessment: s/p Procedure(s) with comments: TOTAL LAPAROSCOPIC HYSTERECTOMY WITH BILATERAL SALPINGO OOPHORECTOMY (Bilateral) - extended recovery bed needed POSTERIOR REPAIR (RECTOCELE) (N/A) CYSTOSCOPY (N/A): stable and progressing well  Plan: Advance diet Encourage ambulation Advance to PO medication Discontinue IV fluids  Anticipate d/c in the  am Ambulating well, will d/c am lovenox dose  LOS: 0 days    Salvadore Dom 07/14/2018, 6:24 PM

## 2018-07-14 NOTE — Addendum Note (Signed)
Addendum  created 07/14/18 1229 by Mechele Claude, CRNA   Intraprocedure LDAs edited, Intraprocedure Meds edited, LDA properties accepted

## 2018-07-14 NOTE — Anesthesia Procedure Notes (Signed)
Procedure Name: Intubation Date/Time: 07/14/2018 7:43 AM Performed by: Barnet Glasgow, MD Pre-anesthesia Checklist: Patient identified, Emergency Drugs available, Suction available and Patient being monitored Patient Re-evaluated:Patient Re-evaluated prior to induction Oxygen Delivery Method: Circle system utilized Preoxygenation: Pre-oxygenation with 100% oxygen Induction Type: IV induction, Cricoid Pressure applied and Rapid sequence Laryngoscope Size: Mac and 3 Grade View: Grade I Tube type: Oral Number of attempts: 1 Airway Equipment and Method: Stylet Placement Confirmation: ETT inserted through vocal cords under direct vision,  positive ETCO2 and breath sounds checked- equal and bilateral Secured at: 21 cm Tube secured with: Tape Dental Injury: Teeth and Oropharynx as per pre-operative assessment

## 2018-07-14 NOTE — Op Note (Signed)
Preoperative Diagnosis: Complex left ovarian mass, endometrial polyp, family history of uterine cancer, rectocele  Postoperative Diagnosis: Same  Procedure:  Total Laparoscopic Hysterectomy with bilateral salpingo-oophorectomy, rectocele repair and cystoscopy  Surgeon: Dr Sumner Boast  Assistant: Dr Edwinna Areola, an MD assistant was necessary for tissue manipulation, retraction and positioning due to the complexity of the case and hospital policies  Anesthesia: General  EBL: 25 cc  Fluids: 1,000 cc LR  Urine output: 202 cc  Complications: none  Indications for surgery: The patient is a 67 year old female, who presented with an incidental finding of a 2.5 cm complex left ovarian mass, no ascites or lymphadenopathy. On review of her CT from 5 years the mass was present, but not seen. It had grown slightly since that original ultrasound. Work up included an ultrasound that showed an echogenic appearing left ovarian mass without blood flow and a thickened endometrium. Sonohysterogram revealed an endometrial polyp. CA 125 was normal. We discussed options for surgery and the patient strongly desired hysterectomy. She also had a small bothersome rectocele and desired repair. The patient is aware of the risks and complications involved with the surgery and consent was obtained prior to the procedure.  Findings: Small grade 2 rectocele, normal sized uterus. Laparoscopy: slightly enlarged normal appearing left ovary. Normal uterus, right adnexa, left tube and liver edge.   Procedure: The patient was taken to the operating room with an IV in placed, preoperative antibiotics and lovenox had been administered. She was placed in the dorsal lithotomy position. General anesthesia was administered. She was prepped and draped in the usual sterile fashion for an abdominal, vaginal surgery. A rumi uterine manipulator was placed, using a # 3 cup and a 6 cm extender. A foley catheter was placed.    The  umbilicus was everted, injected with 0.25% marcaine and incised with a # 11 blade. 2 towel clips were used to elevated the umbilicus and a veress needle was placed into the abdominal cavity. The abdominal cavity was insufflated with CO2, with normal intraabdominal pressures. After adequate pneumo-insufflation the veress needle was removed and the 5 mm laparoscope was placed into the abdominal cavity using the opti-view trocar. The patient was placed in trendelenburg and the abdominal pelvic cavity was inspected. 3 more trocars were placed: 1 in each lower quadrant approximately 3 cm medial to and superior to the anterior superior iliac spine and one in the midline approximately 6 cm above the pubic symphysis in the midline. These areas were injected with 0.25% marcaine, incised with a #11 blade and all trocars were inserted with direct visualization with the laparoscope. A # 5 airseal trocar was placed in the RLQ, a 5 mm trocar in the LLQ and a #8 airseal in the midline. The abdominal pelvic cavity was again inspected. A mixture of 30 cc of Robivacaine and 30 cc of NS was place in the pelvic cavity. Pelvic washings were obtained. The ureters were identified bilaterally.   The left infundibulopelvic was elevated from the pelvic sidewall, cauterized and cut with the ligasure device. The mesosalpinx/mesoovarium were cauterized and cut with the ligasure device. The left round ligament was cauterized and cut with the ligasure device and the anterior and posterior leafs of the broad ligament were taken down with the ligasure device. The harmonic scalpel was then used to take down the bladder flap and skeltonize the vessels. The left uterine vessels were then clamped, cauterized and ligated with the ligasure device. Hemostasis was excellent. The same procedure was  repeated on the right.   Using the rumi manipulator the uterus was pushed up in the pelvic cavity and the harmonic scalpel was used to separate the cervix  from the vagina using the harmonic energy. The uterus and bilateral adnexa were removed vaginally at this time. An occluder was placed in the vagina to maintain pneumoperitoneum. The vaginal cuff was then closed with a 0 V-lock suture. Hemostasis was excellent. The abdominal pelvic cavity was irrigated and suctioned dry. Pressure was released and hemostasis remained excellent.   The abdominal cavity was desufflated and the trocars were removed. The skin was closed with subcuticular stiches of 4-0 vicryl and dermabond was placed over the incisions.  Attention was turned to the rectocele repair. 2 alices were placed on the posterior fourchette in the midline on both sides and one was placed proximally on the rectocele. The vaginal opening was tight so no perineal skin was removed. The vaginal mucosa was injected with lidocaine with epinephrine. The vaginal mucosa was opened distally in the midline with a #15 blade. The vagina was dissected off of the rectum with the Metzenbaum scissors and incised in the midline. The rectocele was reduced and was closed with multiple interrupted sutures of 0 Vicryl.  A small amount of gelfoam was placed over the rectum and under the vaginal mucosa. A small amount of vaginal mucosa was trimmed and the vaginal mucosa was closed with a running stitch of 2-0 Vicryl. At the very distal portion of the vagina the vagina was closed to the perineum in a transverse instead of a vertical manner to slightly loosen the tight perineal opening. Hemostasis was excellent. A couple of grams of estrace cream were placed in the vagina.   The foley catheter was removed and cystoscopy was performed using a 70 degree scope. Both ureters expelled urine, no bladder abnormalities were noted. The bladder was allowed to drain and the cystoscope was removed.   The patient's abdomen and perineum were cleansed and she was taken out of the dorsal lithotomy position. Upon awakening she was extubated and taken  to the recovery room in stable condition. The sponge and instrument counts were correct.

## 2018-07-14 NOTE — Transfer of Care (Signed)
Last Vitals:  Vitals Value Taken Time  BP 145/84 07/14/2018 10:00 AM  Temp    Pulse 75 07/14/2018 10:02 AM  Resp 14 07/14/2018 10:02 AM  SpO2 96 % 07/14/2018 10:02 AM  Vitals shown include unvalidated device data.  Last Pain:  Vitals:   07/14/18 1950  TempSrc:   PainSc: 4       Patients Stated Pain Goal: 6 (07/14/18 9326)  Immediate Anesthesia Transfer of Care Note  Patient: Beth Peters  Procedure(s) Performed: Procedure(s) (LRB): TOTAL LAPAROSCOPIC HYSTERECTOMY WITH BILATERAL SALPINGO OOPHORECTOMY (Bilateral) POSTERIOR REPAIR (RECTOCELE) (N/A) CYSTOSCOPY (N/A)  Patient Location: PACU  Anesthesia Type: General  Level of Consciousness: awake, alert  and oriented  Airway & Oxygen Therapy: Patient Spontanous Breathing and Patient connected to nasal cannula oxygen  Post-op Assessment: Report given to PACU RN and Post -op Vital signs reviewed and stable  Post vital signs: Reviewed and stable  Complications: No apparent anesthesia complications

## 2018-07-14 NOTE — Interval H&P Note (Signed)
History and Physical Interval Note:  07/14/2018 7:16 AM  Beth Peters  has presented today for surgery, with the diagnosis of adnexal mass, endometrial polyp, rectocele, family history eendometrial and breast cancer.  The various methods of treatment have been discussed with the patient and family. After consideration of risks, benefits and other options for treatment, the patient has consented to  Procedure(s) with comments: [place] (Bilateral) - extended recovery bed needed POSTERIOR REPAIR (RECTOCELE) (N/A) CYSTOSCOPY (N/A) as a surgical intervention .  The patient's history has been reviewed, patient examined, no change in status, stable for surgery.  I have reviewed the patient's chart and labs.  Questions were answered to the patient's satisfaction.     Salvadore Dom

## 2018-07-14 NOTE — Anesthesia Postprocedure Evaluation (Signed)
Anesthesia Post Note  Patient: Beth Peters  Procedure(s) Performed: TOTAL LAPAROSCOPIC HYSTERECTOMY WITH BILATERAL SALPINGO OOPHORECTOMY (Bilateral ) POSTERIOR REPAIR (RECTOCELE) (N/A ) CYSTOSCOPY (N/A )     Patient location during evaluation: PACU Anesthesia Type: General Level of consciousness: awake and alert Pain management: pain level controlled Vital Signs Assessment: post-procedure vital signs reviewed and stable Respiratory status: spontaneous breathing, nonlabored ventilation, respiratory function stable and patient connected to nasal cannula oxygen Cardiovascular status: blood pressure returned to baseline and stable Postop Assessment: no apparent nausea or vomiting Anesthetic complications: no    Last Vitals:  Vitals:   07/14/18 1100 07/14/18 1115  BP: (!) 142/81 135/70  Pulse: 78 76  Resp: 19 15  Temp:    SpO2: 96% 100%    Last Pain:  Vitals:   07/14/18 1117  TempSrc:   PainSc: 4                  Barnet Glasgow

## 2018-07-14 NOTE — Progress Notes (Signed)
GYNECOLOGY  VISIT   HPI: 67 y.o.   Married White or Caucasian Not Hispanic or Latino  female   [phone] with Patient's last menstrual period was 09/23/2012.   here for 1 week post op s/p TLH/BSO and rectocele repair. Pathology was benign. She is doing okay, tired. She is still having mild cramping, bloating after eating. The opening of her vagina is very sore, that is the only real pain. She is having BM's without straining. Voiding fine. No blood on her underwear, notices spotting when she wipes after voiding.   GYNECOLOGIC HISTORY: Patient's last menstrual period was 09/23/2012. Contraception:Hysterectomy Menopausal hormone therapy: None        OB History    Gravida  2   Para  2   Term  2   Preterm      AB      Living  2     SAB      TAB      Ectopic      Multiple      Live Births  2              Patient Active Problem List   Diagnosis Date Noted  . Status post laparoscopic hysterectomy 07/14/2018  . S/P laparoscopic hysterectomy 07/14/2018  . Increased risk of breast cancer, 23.4% 05/10/2018  . Menopausal vasomotor syndrome 05/10/2018  . Abdominal bloating 05/10/2018  . Genetic testing 03/19/2018  . Family history of uterine cancer   . Family history of breast cancer   . Family history of prostate cancer   . Family history of testicular cancer   . Family history of rectal cancer   . Family history of bladder cancer   . Family history of brain cancer   . Family history of lung cancer   . 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 Osteopenia   . Achalasia of esophagus 10/28/2017  . Thoracic spondylosis 10/14/2017  . Chronic thoracic back pain 08/12/2017  . Chronic neck pain 08/12/2017  . 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  . DDD (degenerative disc disease) 10/16/2012  . Scoliosis 10/16/2012  . Rectocele without mention of uterine prolapse 10/14/2012  . Dysphagia, unspecified(787.20) 10/14/2012  . Rheumatoid arthritis ([place])  10/07/2012  . Arthralgia 10/07/2012  . OA (osteoarthritis) 10/07/2012  . Eczema 08/08/2011  . Hyperlipidemia 07/24/2011  . Esophageal reflux 05/24/2009  . Allergic rhinitis 05/24/2009  . Lesion of ulnar nerve 05/24/2009  . Essential hypertension 02/27/2009  . Asthma 02/27/2009    Past Medical History:  Diagnosis Date  . Achalasia of esophagus    s/p  heller myotomy hiatal hernia repair 2006  and x3  dilatation's  . Anemia   . Asthma   . Chronic back pain    lumbar and thoracic  . Chronic constipation   . Cubital tunnel syndrome, bilateral   . Degenerative arthritis of spine   . Esophageal dysmotility    ultrasound 11-15-2011  . Family history of adverse reaction to anesthesia    sister and mother-- ponv  . Family history of bladder cancer   . Family history of brain cancer   . Family history of breast cancer   . Family history of lung cancer   . Family history of prostate cancer   . Family history of rectal cancer   . Family history of testicular cancer   . Family history of uterine cancer   . Gastrointestinal dysmotility   . GERD (gastroesophageal reflux disease)   . Hand tingling  fingers bilateral hand due to bilateral cubital tunnel syndrome  . History of esophageal spasm    s/p  esophageal dilation  . History of hiatal hernia    s/p  repair 2006  . Hypertension   . Lactose intolerance   . 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 OA (osteoarthritis)   . Osteopenia   . Panic disorder   . Polyarthralgia   . PONV (postoperative nausea and vomiting)   . Raynauds syndrome   . Scoliosis of thoracic spine 8/01  . Seasonal affective disorder ([place])   . SUI (stress urinary incontinence, female)   . Superficial thrombophlebitis    x2  left lower leg superficial in  2006  prior to vein stripping     Past Surgical History:  Procedure Laterality Date  . CYSTOSCOPY N/A 07/14/2018   Procedure: CYSTOSCOPY;  Surgeon: Salvadore Dom, MD;  [facility];  Service: Gynecology;  Laterality: N/A;  . D & C HYSTEROSCOPY W/ RESECTION FIBROID  09-21-2002  dr Mohammed Kindle  [place] W/ RESECTION POLYP  12-24-2010  dr Joan Flores  [facility]  . DILATION AND CURETTAGE OF UTERUS    . ESOPHAGEAL DILATION  2013  --- [facility]   Omega dilation for Achalasia  . ESOPHAGOGASTRODUODENOSCOPY N/A 09/20/2017   Procedure: ESOPHAGOGASTRODUODENOSCOPY (EGD);  Surgeon: Clarene Essex, MD;  Location: [facility];  Service: Endoscopy;  Laterality: N/A;  . HELLER MYOTOMY  5885   fundoplication repair hiatal hernia  . KNEE ARTHROSCOPY Left 2006 &  09-27-2003  . KNEE ARTHROSCOPY W/ SYNOVECTOMY Right 09-20-2009  dr Wynelle Link  [facility]  . KNEE SURGERY Right 1962  . RECTOCELE REPAIR N/A 07/14/2018   Procedure: POSTERIOR REPAIR (RECTOCELE);  Surgeon: Salvadore Dom, MD;  Location[facility];  Service: Gynecology;  Laterality: N/A;  . ROTATOR CUFF REPAIR Right 09/2011  . TONSILLECTOMY AND ADENOIDECTOMY  1961  . TOTAL KNEE ARTHROPLASTY Bilateral left 02-05-2006 ;  right 08-14-2007   dr Wynelle Link  [facility]  . TOTAL KNEE REVISION Right 11/26/2017   Procedure: Right knee polyethylene revision;  Surgeon: Gaynelle Arabian, MD;  Location: [facility];  Service: Orthopedics;  Laterality: Right;  Adductor Block  . VARICOSE VEIN SURGERY  09/2005    Current Outpatient Medications  Medication Sig Dispense Refill  . ADVAIR DISKUS 250-50 MCG/DOSE AEPB TAKE 1 PUFF BY MOUTH TWICE A DAY (Patient taking differently: Inhale 1 puff into the lungs 2 (two) times daily as needed (asthma). ) 180 each 1  . buPROPion (WELLBUTRIN XL) 150 MG 24 hr tablet TAKE 1 TABLET BY MOUTH EVERY DAY 90 tablet 0  . CALCIUM PO Take 15 mLs by mouth daily.    . Cholecalciferol (VITAMIN D3) 1000 units CAPS Take 1,000 Units by mouth daily.    Marland Kitchen docusate sodium (COLACE) 100 MG capsule Take 100 mg by mouth 2 (two) times daily as needed for mild constipation.    Marland Kitchen esomeprazole (NEXIUM) 40 MG capsule Take 40 mg by mouth  See admin instructions. Take 40 mg by mouth daily. May take additional 40 mg if needed for acid reflux    . fluticasone (FLONASE) 50 MCG/ACT nasal spray Place 2 sprays into both nostrils daily. 16 g 6  . gabapentin (NEURONTIN) 100 MG capsule Take one tablet q hs( with the 300 mg tablet), if tolerating in 3 days increase to 100 mg bid, if tolerating in 3 days increase to 100 mg TID (Patient taking differently: Take 100 mg  by mouth at bedtime. ) 90 capsule 1  . gabapentin (NEURONTIN) 300 MG capsule TAKE 1 CAPSULE BY MOUTH EVERYDAY AT BEDTIME (Patient taking differently: Take 300 mg by mouth at bedtime. TAKE 1 CAPSULE BY MOUTH EVERYDAY AT BEDTIME) 90 capsule 0  . hydrochlorothiazide (MICROZIDE) 12.5 MG capsule Take 1 capsule (12.5 mg total) by mouth every morning. 90 capsule 1  . ibuprofen (ADVIL,MOTRIN) 200 MG tablet Take 200 mg by mouth every 6 (six) hours as needed.    . raloxifene (EVISTA) 60 MG tablet Take 1 tablet (60 mg total) by mouth daily. 90 tablet 0  . venlafaxine XR (EFFEXOR XR) 37.5 MG 24 hr capsule Take 1 capsule (37.5 mg total) by mouth daily with breakfast. 90 capsule 0   No current facility-administered medications for this visit.      ALLERGIES: Dilaudid [hydromorphone hcl]; Glucagon; Lactose intolerance (gi); and Wasp venom  Family History  Problem Relation Age of Onset  . Hypertension Father   . Heart disease Father   . Stroke Father   . Cancer Father        Testicular, Bladder, Prostate, Lung  . Heart disease Mother   . Stroke Mother   . Osteoporosis Mother   . Uterine cancer Sister 24  . Breast cancer Sister   . Cancer Sister        rectal, liver,lymph nodes  . Diabetes Maternal Grandfather   . Heart disease Maternal Grandfather   . Diabetes Paternal Grandmother   . Breast cancer Maternal Aunt     Social History   Socioeconomic History  . Marital status: Married    Spouse name: Not on file  . Number of children: Not on file  . Years of education: Not on  file  . Highest education level: Not on file  Occupational History  . Not on file  Social Needs  . Financial resource strain: Not on file  . Food insecurity:    Worry: Not on file    Inability: Not on file  . Transportation needs:    Medical: Not on file    Non-medical: Not on file  Tobacco Use  . Smoking status: Never Smoker  . Smokeless tobacco: Never Used  Substance and Sexual Activity  . Alcohol use: Yes    Alcohol/week: 6.0 - 8.0 standard drinks    Types: 6 - 8 Glasses of wine per week  . Drug use: No  . Sexual activity: Yes    Partners: Male    Birth control/protection: Post-menopausal, Surgical    Comment: vasectomy, hysterectomy  Lifestyle  . Physical activity:    Days per week: Not on file    Minutes per session: Not on file  . Stress: Not on file  Relationships  . Social connections:    Talks on phone: Not on file    Gets together: Not on file    Attends religious service: Not on file    Active member of club or organization: Not on file    Attends meetings of clubs or organizations: Not on file    Relationship status: Not on file  . Intimate partner violence:    Fear of current or ex partner: Not on file    Emotionally abused: Not on file    Physically abused: Not on file    Forced sexual activity: Not on file  Other Topics Concern  . Not on file  Social History Narrative  . Not on file    Review of Systems  Constitutional: Positive  for malaise/fatigue.  HENT: Negative.   Eyes: Negative.   Respiratory: Negative.   Cardiovascular: Negative.   Gastrointestinal:       Bloating  Genitourinary:       Pelvic discomfort Vaginal soreness  Musculoskeletal: Negative.   Skin: Negative.   Neurological: Negative.   Endo/Heme/Allergies: Negative.   Psychiatric/Behavioral: Negative.     PHYSICAL EXAMINATION:    BP 130/86 (BP Location: Right Arm, Patient Position: Sitting, Cuff Size: Normal)   Pulse 76   Wt 161 lb (73 kg)   LMP 09/23/2012   BMI 28.07  kg/m     General appearance: alert, cooperative and appears stated age  Abdomen: soft, non-tender; non distended, no masses,  no organomegaly  Pelvic: External genitalia:  no lesions, tender at the posterior fourchette, healing well.              Urethra:  normal appearing urethra with no masses, tenderness or lesions              Bartholins and Skenes: normal                 Vagina:  Vaginal mucosa healing well posteriorly. Not tender with vaginal palpation. Cuff not visualized.               Chaperone was present for exam.  ASSESSMENT 1 week post op s/p TLH/BSO/rectocele repair. Healing well    PLAN Recommended she restart the vaginal estrogen Call with any concerns F/U in 3 weeks   An After Visit Summary was printed and given to the patient.

## 2018-07-15 DIAGNOSIS — N840 Polyp of corpus uteri: Secondary | ICD-10-CM | POA: Diagnosis not present

## 2018-07-15 LAB — CBC
HCT: 40.1 % (ref 36.0–46.0)
Hemoglobin: 13.2 g/dL (ref 12.0–15.0)
MCH: 30.8 pg (ref 26.0–34.0)
MCHC: 32.9 g/dL (ref 30.0–36.0)
MCV: 93.5 fL (ref 80.0–100.0)
Platelets: 185 10*3/uL (ref 150–400)
RBC: 4.29 MIL/uL (ref 3.87–5.11)
RDW: 12.9 % (ref 11.5–15.5)
WBC: 8.1 10*3/uL (ref 4.0–10.5)
nRBC: 0 % (ref 0.0–0.2)

## 2018-07-15 NOTE — Discharge Summary (Signed)
Physician Discharge Summary  Patient ID: Beth Peters MRN: [id number] DOB/AGE: 1951-05-08 68 y.o.  Admit date: 07/14/2018 Discharge date: 07/15/2018  Admission Diagnoses: left adnexal mass, endometrial polyp, family history of uterine cancer, symptomatic rectocele  Discharge Diagnoses:  Active Problems:   Status post laparoscopic hysterectomy   S/P laparoscopic hysterectomy Bilateral salpingo-oophorectomy, rectocele repair  Discharged Condition: good  Hospital Course: uncomplicated  Consults: None  Treatments: surgery: total laparoscopic hysterectomy, bilateral salpingo-oophorectomy, rectocele repair  Today's Vitals   07/15/18 0138 07/15/18 0613 07/15/18 0913 07/15/18 0941  BP: (!) 115/56 129/70 134/70   Pulse: 74 63 69   Resp: 16 16 18    Temp: 97.8 F (36.6 C) 98 F (36.7 C) 98.1 F (36.7 C)   TempSrc: Oral Oral Oral   SpO2: 95% 95% 100%   Weight:      Height:      PainSc:    4    I/O last 3 completed shifts: In: 4599.9 [P.O.:2180; I.V.:2419.9] Out: 3050 [Urine:3025; Blood:25] Total I/O In: 120 [P.O.:120] Out: 0    CBC Latest Ref Rng & Units 07/15/2018 07/14/2018 07/09/2018  WBC 4.0 - 10.5 K/uL 8.1 10.8(H) 5.3  Hemoglobin 12.0 - 15.0 g/dL 13.2 14.0 15.0  Hematocrit 36.0 - 46.0 % 40.1 42.1 45.7  Platelets 150 - 400 K/uL 185 174 211     Discharge Exam: Blood pressure 134/70, pulse 69, temperature 98.1 F (36.7 C), temperature source Oral, resp. rate 18, height 5' 3.5" (1.613 m), weight 73.9 kg, last menstrual period 09/23/2012, SpO2 100 %. Heart: regular rate and rhythm Lungs: CTAB Abdomen: soft, non-tender; bowel sounds normal; no masses,  no organomegaly Incisions: clean, dry and intact without erythema. Significant ecchymosis around LLQ incision, no induration or drainage Extremities: normal, atraumatic, no cyanosis Vag pad: minimal spotting   Disposition: Discharge disposition: 01-Home or Self Care       Discharge Instructions    Call MD  for:   Complete by:  As directed    Heavy vaginal bleeding   Call MD for:  difficulty breathing, headache or visual disturbances   Complete by:  As directed    Call MD for:  extreme fatigue   Complete by:  As directed    Call MD for:  hives   Complete by:  As directed    Call MD for:  persistant dizziness or light-headedness   Complete by:  As directed    Call MD for:  persistant nausea and vomiting   Complete by:  As directed    Call MD for:  redness, tenderness, or signs of infection (pain, swelling, redness, odor or green/yellow discharge around incision site)   Complete by:  As directed    Call MD for:  severe uncontrolled pain   Complete by:  As directed    Call MD for:  temperature >100.4   Complete by:  As directed    Diet - low sodium heart healthy   Complete by:  As directed    Driving Restrictions   Complete by:  As directed    No driving while taking narcotics or until you can slam on the brakes of the car   Increase activity slowly   Complete by:  As directed    Lifting restrictions   Complete by:  As directed    No lifting over 10 lbs   No dressing needed   Complete by:  As directed    Sexual Activity Restrictions   Complete by:  As directed  No intercourse for 2-3 months     Allergies as of 07/15/2018      Reactions   Dilaudid [hydromorphone Hcl] Hives, Shortness Of Breath, Itching   Glucagon Anaphylaxis   Lactose Intolerance (gi)    Wasp Venom Itching, Swelling      Medication List    TAKE these medications   ADVAIR DISKUS 250-50 MCG/DOSE Aepb Generic drug:  Fluticasone-Salmeterol TAKE 1 PUFF BY MOUTH TWICE A DAY What changed:  See the new instructions.   buPROPion 150 MG 24 hr tablet Commonly known as:  WELLBUTRIN XL TAKE 1 TABLET BY MOUTH EVERY DAY   CALCIUM PO Take 15 mLs by mouth daily.   docusate sodium 100 MG capsule Commonly known as:  COLACE Take 100 mg by mouth 2 (two) times daily as needed for mild constipation.   esomeprazole 40  MG capsule Commonly known as:  NEXIUM Take 40 mg by mouth See admin instructions. Take 40 mg by mouth daily. May take additional 40 mg if needed for acid reflux   fluticasone 50 MCG/ACT nasal spray Commonly known as:  FLONASE Place 2 sprays into both nostrils daily.   gabapentin 300 MG capsule Commonly known as:  NEURONTIN TAKE 1 CAPSULE BY MOUTH EVERYDAY AT BEDTIME What changed:    how much to take  how to take this  when to take this   gabapentin 100 MG capsule Commonly known as:  NEURONTIN Take one tablet q hs( with the 300 mg tablet), if tolerating in 3 days increase to 100 mg bid, if tolerating in 3 days increase to 100 mg TID What changed:    how much to take  how to take this  when to take this  additional instructions   hydrochlorothiazide 12.5 MG capsule Commonly known as:  MICROZIDE Take 1 capsule (12.5 mg total) by mouth every morning.   raloxifene 60 MG tablet Commonly known as:  EVISTA Take 1 tablet (60 mg total) by mouth daily.   venlafaxine XR 37.5 MG 24 hr capsule Commonly known as:  EFFEXOR XR Take 1 capsule (37.5 mg total) by mouth daily with breakfast.   Vitamin D3 25 MCG (1000 UT) Caps Take 1,000 Units by mouth daily.      Patient has pain medication at home  Signed: Salvadore Dom 07/15/2018, 10:25 AM

## 2018-07-16 ENCOUNTER — Encounter (HOSPITAL_BASED_OUTPATIENT_CLINIC_OR_DEPARTMENT_OTHER): Payer: Self-pay | Admitting: Obstetrics and Gynecology

## 2018-07-20 ENCOUNTER — Ambulatory Visit: Payer: Self-pay

## 2018-07-21 ENCOUNTER — Ambulatory Visit (INDEPENDENT_AMBULATORY_CARE_PROVIDER_SITE_OTHER): Payer: PPO | Admitting: Obstetrics and Gynecology

## 2018-07-21 ENCOUNTER — Other Ambulatory Visit: Payer: Self-pay

## 2018-07-21 ENCOUNTER — Encounter: Payer: Self-pay | Admitting: Obstetrics and Gynecology

## 2018-07-21 VITALS — BP 130/86 | HR 76 | Wt 161.0 lb

## 2018-07-21 DIAGNOSIS — Z90722 Acquired absence of ovaries, bilateral: Secondary | ICD-10-CM

## 2018-07-21 DIAGNOSIS — Z9889 Other specified postprocedural states: Secondary | ICD-10-CM

## 2018-07-21 DIAGNOSIS — Z9079 Acquired absence of other genital organ(s): Secondary | ICD-10-CM

## 2018-07-21 DIAGNOSIS — Z90710 Acquired absence of both cervix and uterus: Secondary | ICD-10-CM

## 2018-07-22 ENCOUNTER — Other Ambulatory Visit: Payer: Self-pay | Admitting: Family Medicine

## 2018-07-22 DIAGNOSIS — R600 Localized edema: Secondary | ICD-10-CM

## 2018-07-24 ENCOUNTER — Other Ambulatory Visit: Payer: Self-pay | Admitting: Obstetrics and Gynecology

## 2018-07-27 NOTE — Telephone Encounter (Signed)
Medication refill request: Gabapentin Last AEX:   11/20/17 Next OV: 08/11/18  Last MMG (if hormonal medication request): 05/04/13 Bi-rads 1 neg  refillll authorized: #90 with 0RF

## 2018-07-28 ENCOUNTER — Other Ambulatory Visit: Payer: Self-pay | Admitting: Family Medicine

## 2018-07-28 DIAGNOSIS — N951 Menopausal and female climacteric states: Secondary | ICD-10-CM

## 2018-08-07 ENCOUNTER — Ambulatory Visit: Payer: Self-pay

## 2018-08-10 ENCOUNTER — Ambulatory Visit
Admission: RE | Admit: 2018-08-10 | Discharge: 2018-08-10 | Disposition: A | Discharge status: Home / Self Care | Payer: PPO | Source: Ambulatory Visit | Attending: Obstetrics and Gynecology | Admitting: Obstetrics and Gynecology

## 2018-08-10 DIAGNOSIS — Z1231 Encounter for screening mammogram for malignant neoplasm of breast: Secondary | ICD-10-CM

## 2018-08-11 ENCOUNTER — Encounter: Payer: Self-pay | Admitting: Obstetrics and Gynecology

## 2018-08-11 ENCOUNTER — Ambulatory Visit (INDEPENDENT_AMBULATORY_CARE_PROVIDER_SITE_OTHER): Payer: PPO | Admitting: Obstetrics and Gynecology

## 2018-08-11 ENCOUNTER — Other Ambulatory Visit: Payer: Self-pay

## 2018-08-11 VITALS — BP 142/88 | HR 72 | Wt 165.2 lb

## 2018-08-11 DIAGNOSIS — Z9889 Other specified postprocedural states: Secondary | ICD-10-CM

## 2018-08-11 DIAGNOSIS — Z90710 Acquired absence of both cervix and uterus: Secondary | ICD-10-CM

## 2018-08-11 MED ORDER — ESTRADIOL 0.1 MG/GM VA CREA: TOPICAL_CREAM | VAGINAL | 1 refills | Status: DC

## 2018-08-11 NOTE — Progress Notes (Signed)
GYNECOLOGY  VISIT   HPI: 68 y.o.   Married White or Caucasian Not Hispanic or Latino  female   [phone] with Patient's last menstrual period was 09/23/2012.here for 4 week post op s/p TLH/BSO/rectocele repair.  Occasional twinge of pain in the lower abdomen or vagina (very minor). Having normal bowel movements, normal voiding. No bleeding.   GYNECOLOGIC HISTORY: Patient's last menstrual period was 09/23/2012. Contraception: Hysterectomy Menopausal hormone therapy: None        OB History    Gravida  2   Para  2   Term  2   Preterm      AB      Living  2     SAB      TAB      Ectopic      Multiple      Live Births  2              Patient Active Problem List   Diagnosis Date Noted  . Status post laparoscopic hysterectomy 07/14/2018  . S/P laparoscopic hysterectomy 07/14/2018  . Increased risk of breast cancer, 23.4% 05/10/2018  . Menopausal vasomotor syndrome 05/10/2018  . Abdominal bloating 05/10/2018  . Genetic testing 03/19/2018  . Family history of uterine cancer   . Family history of breast cancer   . Family history of prostate cancer   . Family history of testicular cancer   . Family history of rectal cancer   . Family history of bladder cancer   . Family history of brain cancer   . Family history of lung cancer   . 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 Osteopenia   . Achalasia of esophagus 10/28/2017  . Thoracic spondylosis 10/14/2017  . Chronic thoracic back pain 08/12/2017  . Chronic neck pain 08/12/2017  . 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  . DDD (degenerative disc disease) 10/16/2012  . Scoliosis 10/16/2012  . Rectocele without mention of uterine prolapse 10/14/2012  . Dysphagia, unspecified(787.20) 10/14/2012  . Rheumatoid arthritis ([place]) 10/07/2012  . Arthralgia 10/07/2012  . OA (osteoarthritis) 10/07/2012  . Eczema 08/08/2011  . Hyperlipidemia 07/24/2011  . Esophageal reflux 05/24/2009  . Allergic  rhinitis 05/24/2009  . Lesion of ulnar nerve 05/24/2009  . Essential hypertension 02/27/2009  . Asthma 02/27/2009    Past Medical History:  Diagnosis Date  . Achalasia of esophagus    s/p  heller myotomy hiatal hernia repair 2006  and x3  dilatation's  . Anemia   . Asthma   . Chronic back pain    lumbar and thoracic  . Chronic constipation   . Cubital tunnel syndrome, bilateral   . Degenerative arthritis of spine   . Esophageal dysmotility    ultrasound 11-15-2011  . Family history of adverse reaction to anesthesia    sister and mother-- ponv  . Family history of bladder cancer   . Family history of brain cancer   . Family history of breast cancer   . Family history of lung cancer   . Family history of prostate cancer   . Family history of rectal cancer   . Family history of testicular cancer   . Family history of uterine cancer   . Gastrointestinal dysmotility   . GERD (gastroesophageal reflux disease)   . Hand tingling    fingers bilateral hand due to bilateral cubital tunnel syndrome  . History of esophageal spasm    s/p  esophageal dilation  . History of hiatal hernia    s/p  repair 2006  . Hypertension   . Lactose intolerance   . 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 OA (osteoarthritis)   . Osteopenia   . Panic disorder   . Polyarthralgia   . PONV (postoperative nausea and vomiting)   . Raynauds syndrome   . Scoliosis of thoracic spine 8/01  . Seasonal affective disorder ([place])   . SUI (stress urinary incontinence, female)   . Superficial thrombophlebitis    x2  left lower leg superficial in  2006  prior to vein stripping     Past Surgical History:  Procedure Laterality Date  . CYSTOSCOPY N/A 07/14/2018   Procedure: CYSTOSCOPY;  Surgeon: Salvadore Dom, MD;  Location: [facility];  Service: Gynecology;  Laterality: N/A;  . D & C HYSTEROSCOPY W/ RESECTION FIBROID  09-21-2002  dr Mohammed Kindle  [place] W/ RESECTION POLYP   12-24-2010  dr Joan Flores  [facility]  . DILATION AND CURETTAGE OF UTERUS    . ESOPHAGEAL DILATION  2013  --- [facility]   Omega dilation for Achalasia  . ESOPHAGOGASTRODUODENOSCOPY N/A 09/20/2017   Procedure: ESOPHAGOGASTRODUODENOSCOPY (EGD);  Surgeon: Clarene Essex, MD;  Location: [facility];  Service: Endoscopy;  Laterality: N/A;  . HELLER MYOTOMY  5170   fundoplication repair hiatal hernia  . KNEE ARTHROSCOPY Left 2006 &  09-27-2003  . KNEE ARTHROSCOPY W/ SYNOVECTOMY Right 09-20-2009  dr Wynelle Link  [facility]  . KNEE SURGERY Right 1962  . RECTOCELE REPAIR N/A 07/14/2018   Procedure: POSTERIOR REPAIR (RECTOCELE);  Surgeon: Salvadore Dom, MD;  Location: [facility];  Service: Gynecology;  Laterality: N/A;  . ROTATOR CUFF REPAIR Right 09/2011  . TONSILLECTOMY AND ADENOIDECTOMY  1961  . TOTAL KNEE ARTHROPLASTY Bilateral left 02-05-2006 ;  right 08-14-2007   dr Wynelle Link  [facility]  . TOTAL KNEE REVISION Right 11/26/2017   Procedure: Right knee polyethylene revision;  Surgeon: Gaynelle Arabian, MD;  Location: [facility];  Service: Orthopedics;  Laterality: Right;  Adductor Block  . VARICOSE VEIN SURGERY  09/2005    Current Outpatient Medications  Medication Sig Dispense Refill  . ADVAIR DISKUS 250-50 MCG/DOSE AEPB TAKE 1 PUFF BY MOUTH TWICE A DAY (Patient taking differently: Inhale 1 puff into the lungs 2 (two) times daily as needed (asthma). ) 180 each 1  . buPROPion (WELLBUTRIN XL) 150 MG 24 hr tablet TAKE 1 TABLET BY MOUTH EVERY DAY 90 tablet 0  . CALCIUM PO Take 15 mLs by mouth daily.    . Cholecalciferol (VITAMIN D3) 1000 units CAPS Take 1,000 Units by mouth daily.    Marland Kitchen esomeprazole (NEXIUM) 40 MG capsule Take 40 mg by mouth See admin instructions. Take 40 mg by mouth daily. May take additional 40 mg if needed for acid reflux    . fluticasone (FLONASE) 50 MCG/ACT nasal spray Place 2 sprays into both nostrils daily. 16 g 6  . gabapentin (NEURONTIN) 300 MG capsule TAKE 1 CAPSULE BY MOUTH EVERYDAY AT  BEDTIME 30 capsule 0  . hydrochlorothiazide (HYDRODIURIL) 12.5 MG tablet Take 1 tablet (12.5 mg total) by mouth daily. 90 tablet 1  . ibuprofen (ADVIL,MOTRIN) 200 MG tablet Take 200 mg by mouth every 6 (six) hours as needed.    . raloxifene (EVISTA) 60 MG tablet Take 1 tablet (60 mg total) by mouth daily. 90 tablet 0  . venlafaxine XR (EFFEXOR-XR) 37.5 MG 24 hr capsule TAKE 1 CAPSULE BY MOUTH EVERY DAY WITH BREAKFAST 90 capsule 0  .  docusate sodium (COLACE) 100 MG capsule Take 100 mg by mouth 2 (two) times daily as needed for mild constipation.     No current facility-administered medications for this visit.      ALLERGIES: Dilaudid [hydromorphone hcl]; Glucagon; Lactose intolerance (gi); and Wasp venom  Family History  Problem Relation Age of Onset  . Hypertension Father   . Heart disease Father   . Stroke Father   . Cancer Father        Testicular, Bladder, Prostate, Lung  . Heart disease Mother   . Stroke Mother   . Osteoporosis Mother   . Uterine cancer Sister 44  . Breast cancer Sister   . Cancer Sister        rectal, liver,lymph nodes  . Diabetes Maternal Grandfather   . Heart disease Maternal Grandfather   . Diabetes Paternal Grandmother   . Breast cancer Maternal Aunt     Social History   Socioeconomic History  . Marital status: Married    Spouse name: Not on file  . Number of children: Not on file  . Years of education: Not on file  . Highest education level: Not on file  Occupational History  . Not on file  Social Needs  . Financial resource strain: Not on file  . Food insecurity:    Worry: Not on file    Inability: Not on file  . Transportation needs:    Medical: Not on file    Non-medical: Not on file  Tobacco Use  . Smoking status: Never Smoker  . Smokeless tobacco: Never Used  Substance and Sexual Activity  . Alcohol use: Yes    Alcohol/week: 6.0 - 8.0 standard drinks    Types: 6 - 8 Glasses of wine per week  . Drug use: No  . Sexual activity:  Yes    Partners: Male    Birth control/protection: Post-menopausal, Surgical    Comment: vasectomy, hysterectomy  Lifestyle  . Physical activity:    Days per week: Not on file    Minutes per session: Not on file  . Stress: Not on file  Relationships  . Social connections:    Talks on phone: Not on file    Gets together: Not on file    Attends religious service: Not on file    Active member of club or organization: Not on file    Attends meetings of clubs or organizations: Not on file    Relationship status: Not on file  . Intimate partner violence:    Fear of current or ex partner: Not on file    Emotionally abused: Not on file    Physically abused: Not on file    Forced sexual activity: Not on file  Other Topics Concern  . Not on file  Social History Narrative  . Not on file    Review of Systems  Constitutional: Negative.   HENT: Negative.   Eyes: Negative.   Respiratory: Negative.   Cardiovascular: Negative.   Gastrointestinal: Negative.   Genitourinary:       Right sided pelvic/abdominal pain- mild  Musculoskeletal: Negative.   Skin: Negative.   Neurological: Negative.   Endo/Heme/Allergies: Negative.   Psychiatric/Behavioral: Negative.     PHYSICAL EXAMINATION:    BP (!) 142/88 (BP Location: Right Arm, Patient Position: Sitting, Cuff Size: Normal)   Pulse 72   Wt 165 lb 3.2 oz (74.9 kg)   LMP 09/23/2012   BMI 28.80 kg/m     General appearance: alert, cooperative and  appears stated age Abdomen: soft, non-tender; non distended, no masses,  no organomegaly Incisions: well healed  Pelvic: External genitalia:  no lesions              Urethra:  normal appearing urethra with no masses, tenderness or lesions              Bartholins and Skenes: normal                 Vagina:atrophic appearing vagina vaginal apex and posterior vagina are healing well              Cervix: absent              Bimanual Exam:  Uterus:  uterus absent              Adnexa: no mass,  fullness, tenderness                Chaperone was present for exam.  ASSESSMENT 4 weeks s/p TLH/BSO/rectocele repair, doing well    PLAN F/U for annual in 6 months Breast MRI already ordered Vaginal estrogen for healing    An After Visit Summary was printed and given to the patient.

## 2018-08-13 ENCOUNTER — Telehealth: Payer: Self-pay | Admitting: *Deleted

## 2018-08-13 NOTE — Telephone Encounter (Signed)
Spoke with patient. Breast MRI not scheduled to date, call to f/u. Patient had screening MMG on 08/10/18, wants to proceed with breast MRI in 6 months. Advised order has been placed at [facility], advised patient may contact [place] directly to schedule. Return call to office if any additional assistance needed.   Routing to provider for final review. Patient is agreeable to disposition. Will close encounter.  CC: Lerry Liner, Magdalene Patricia

## 2018-08-24 ENCOUNTER — Other Ambulatory Visit: Payer: Self-pay | Admitting: Obstetrics and Gynecology

## 2018-08-24 MED ORDER — GABAPENTIN 100 MG PO CAPS: ORAL_CAPSULE | ORAL | 0 refills | Status: DC

## 2018-08-24 NOTE — Telephone Encounter (Signed)
Medication refill request: Gabapentin  Last AEX: 11-20-17 JJ  Next AEX: 11-25-2018 Last MMG (if hormonal medication request): 08-10-2018 density C/ BIRADS 1 negative  Refill authorized: 07-28-2018 #30, 0RF. Please advise.   Medication pended for #30, 3RF as patient has aex scheduled for 11-25-2018. Please refill if appropriate.

## 2018-08-24 NOTE — Telephone Encounter (Signed)
Call to patient. Patient states that she does need a refill for 100mg  tablets. Patient states she takes 400mg  total at night, and states she is currently is taking one every day. States when she started the estrogen cream the hot flashes returned, so she will take every day until her "body adjusts." RN advised would send request to Dr. Talbert Nan. [facility] [place].   Medication for Gabapentin 100mg , #90, 0RF pended to [facility] [place]. Refill if appropriate.

## 2018-08-24 NOTE — Telephone Encounter (Signed)
I sent the script for the nightly gabapentin, she also had a script for gabapentin to take during the day. Please see if she needs any more of that gabapentin (100 mg tablets) and find out how often she is using it.

## 2018-09-08 ENCOUNTER — Other Ambulatory Visit: Payer: Self-pay

## 2018-09-08 ENCOUNTER — Other Ambulatory Visit: Payer: Self-pay | Admitting: Family Medicine

## 2018-09-08 DIAGNOSIS — N951 Menopausal and female climacteric states: Secondary | ICD-10-CM

## 2018-09-10 DIAGNOSIS — H2513 Age-related nuclear cataract, bilateral: Secondary | ICD-10-CM | POA: Diagnosis not present

## 2018-09-28 DIAGNOSIS — H02834 Dermatochalasis of left upper eyelid: Secondary | ICD-10-CM | POA: Diagnosis not present

## 2018-09-28 DIAGNOSIS — H02831 Dermatochalasis of right upper eyelid: Secondary | ICD-10-CM | POA: Diagnosis not present

## 2018-10-15 ENCOUNTER — Other Ambulatory Visit: Payer: Self-pay

## 2018-10-15 DIAGNOSIS — M533 Sacrococcygeal disorders, not elsewhere classified: Secondary | ICD-10-CM | POA: Diagnosis not present

## 2018-10-15 DIAGNOSIS — R42 Dizziness and giddiness: Secondary | ICD-10-CM | POA: Diagnosis not present

## 2018-10-15 DIAGNOSIS — E0590 Thyrotoxicosis, unspecified without thyrotoxic crisis or storm: Secondary | ICD-10-CM | POA: Diagnosis not present

## 2018-10-15 DIAGNOSIS — M62830 Muscle spasm of back: Secondary | ICD-10-CM | POA: Diagnosis not present

## 2018-10-15 DIAGNOSIS — M545 Low back pain: Secondary | ICD-10-CM | POA: Diagnosis not present

## 2018-10-15 DIAGNOSIS — Z23 Encounter for immunization: Secondary | ICD-10-CM | POA: Diagnosis not present

## 2018-10-15 NOTE — Patient Outreach (Signed)
[facility]) Care Management  10/15/2018  Beth Peters [name] Mar 26, 1951 [id number]   Referral Date: 10/14/2018 Referral Source: Nurseline Referral Reason: Pulled muscle in hip and would like a muscle relaxer.   Outreach Attempt: no answer.  HIPAA compliant voice message left.  Plan: RN CM will attempt patient again within 4 business days.    Jone Baseman, RN, MSN [facility] Care Management Care Management Coordinator Direct Line [phone] Toll Free: [phone]  Fax: [phone]

## 2018-10-19 ENCOUNTER — Other Ambulatory Visit: Payer: Self-pay

## 2018-10-19 DIAGNOSIS — M62830 Muscle spasm of back: Secondary | ICD-10-CM | POA: Insufficient documentation

## 2018-10-19 DIAGNOSIS — M545 Low back pain: Secondary | ICD-10-CM | POA: Insufficient documentation

## 2018-10-19 DIAGNOSIS — M533 Sacrococcygeal disorders, not elsewhere classified: Secondary | ICD-10-CM | POA: Insufficient documentation

## 2018-10-19 NOTE — Patient Outreach (Signed)
[facility]) Care Management  10/19/2018  Beth Peters 1950-12-07 [id number]   Referral Date: 10/14/2018 Referral Source: Nurseline Referral Reason: Pulled muscle in hip and would like a muscle relaxer.   Outreach Attempt: no answer.  HIPAA compliant voice message left.  Plan: RN CM will attempt patient again within 4 business days.   Jone Baseman, RN, MSN [place] Management Care Management Coordinator Direct Line [phone] Cell [phone] Toll Free: [phone]  Fax: [phone]

## 2018-10-21 ENCOUNTER — Other Ambulatory Visit: Payer: Self-pay

## 2018-10-21 NOTE — Patient Outreach (Signed)
[facility]) Care Management  10/21/2018  Kesi Perrow Dorey 1951-02-27 [id number]   Referral Date:10/14/2018 Referral Source:Nurseline Referral Reason:Pulled muscle in hip and would like a muscle relaxer.   Outreach Attempt:no answer. HIPAA compliant voice message left.  Plan:RN CM will wait return call. If no return call will close case.  Jone Baseman, RN, MSN [place] Management Care Management Coordinator Direct Line [phone] Cell [phone] Toll Free: [phone]  Fax: [phone]

## 2018-10-24 ENCOUNTER — Other Ambulatory Visit: Payer: Self-pay | Admitting: Family Medicine

## 2018-10-28 ENCOUNTER — Other Ambulatory Visit: Payer: Self-pay

## 2018-10-28 NOTE — Patient Outreach (Signed)
[facility]) Care Management  10/28/2018  Beth Peters Jul 04, 1951 [id number]    Multiple attempts to establish contact with patient without success. No response from letter mailed to patient.   Plan: RN CM will close case at this time.   Beth Baseman, RN, MSN [place] Management Care Management Coordinator Direct Line [phone] Cell [phone] Toll Free: [phone]  Fax: [phone]

## 2018-10-29 DIAGNOSIS — J449 Chronic obstructive pulmonary disease, unspecified: Secondary | ICD-10-CM | POA: Diagnosis not present

## 2018-10-29 DIAGNOSIS — M810 Age-related osteoporosis without current pathological fracture: Secondary | ICD-10-CM | POA: Diagnosis not present

## 2018-10-29 DIAGNOSIS — I2510 Atherosclerotic heart disease of native coronary artery without angina pectoris: Secondary | ICD-10-CM | POA: Diagnosis not present

## 2018-10-29 DIAGNOSIS — F3181 Bipolar II disorder: Secondary | ICD-10-CM | POA: Diagnosis not present

## 2018-10-29 DIAGNOSIS — R7989 Other specified abnormal findings of blood chemistry: Secondary | ICD-10-CM | POA: Diagnosis not present

## 2018-10-29 DIAGNOSIS — M533 Sacrococcygeal disorders, not elsewhere classified: Secondary | ICD-10-CM | POA: Insufficient documentation

## 2018-10-29 DIAGNOSIS — E7800 Pure hypercholesterolemia, unspecified: Secondary | ICD-10-CM | POA: Diagnosis not present

## 2018-11-25 ENCOUNTER — Ambulatory Visit: Payer: PPO | Admitting: Obstetrics and Gynecology

## 2018-11-29 ENCOUNTER — Other Ambulatory Visit: Payer: Self-pay | Admitting: Obstetrics and Gynecology

## 2018-11-30 NOTE — Telephone Encounter (Signed)
Medication refill request: gabapentin 100mg  & 300mg  Last AEX:  11-20-17 Next AEX: cancelled due to covid Last MMG (if hormonal medication request): n/a Refill authorized: pharmacy note placed for patient to call & schedule yearly exam before further refills after that.

## 2018-12-03 NOTE — Progress Notes (Signed)
Virtual Visit via Video   Due to the COVID-19 pandemic, this visit was completed with telemedicine (audio/video) technology to reduce patient and provider exposure as well as to preserve personal protective equipment.   I connected with Beth Peters by a video enabled telemedicine application and verified that I am speaking with the correct person using two identifiers. Location patient: Home Location provider: [facility], Office Persons participating in the virtual visit: MAGON CROSON, Briscoe Deutscher, DO Lonell Grandchild, CMA acting as scribe for Dr. Briscoe Deutscher.   I discussed the limitations of evaluation and management by telemedicine and the availability of in person appointments. The patient expressed understanding and agreed to proceed.  Care Team   Patient Care Team: Briscoe Deutscher, DO as PCP - General (Family Medicine) Gaynelle Arabian, MD as Consulting Physician (Orthopedic Surgery) Levy Pupa, PA-C as Physician Assistant (Chiropractic Medicine)  Subjective:   HPI: Patient has had ongoing post nasal drip and sore throat. She does have congestion and cough every morning. She denies any fever, nausea or vomiting. She has been taking a nasel spray. She does feel some swelling at both sides of neck. She has sore throat.   She is currently only taking Flonase and Claritin. We will add Astelin and Singulair. She will send message in about 2 weeks and let us know if she has had any improvement. If not, we will send to Allergist.  Review of Systems  Constitutional: Negative for chills and fever.  HENT: Negative for ear pain and tinnitus.   Eyes: Negative for blurred vision and double vision.  Respiratory: Positive for cough and sputum production.   Cardiovascular: Negative for chest pain and palpitations.  Gastrointestinal: Negative for heartburn, nausea and vomiting.  Genitourinary: Negative for dysuria, frequency and urgency.  Musculoskeletal: Negative for  myalgias.  Skin: Negative for rash.  Neurological: Negative for dizziness and headaches.  Endo/Heme/Allergies: Does not bruise/bleed easily.  Psychiatric/Behavioral: Negative for depression and suicidal ideas.    Patient Active Problem List   Diagnosis Date Noted  . Deviated septum 12/05/2018  . Sacroiliac joint pain 10/29/2018  . Low back pain 10/19/2018  . Disorder of sacrum 10/19/2018  . Back muscle spasm 10/19/2018  . Status post laparoscopic hysterectomy 07/14/2018  . S/P laparoscopic hysterectomy 07/14/2018  . Increased risk of breast cancer, 23.4% 05/10/2018  . Menopausal vasomotor syndrome 05/10/2018  . Genetic testing 03/19/2018  . Family history of uterine cancer   . Family history of breast cancer   . Family history of prostate cancer   . Family history of testicular cancer   . Family history of rectal cancer   . Family history of bladder cancer   . Family history of brain cancer   . Family history of lung cancer   . 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 Osteopenia   . Achalasia of esophagus 10/28/2017  . Thoracic spondylosis 10/14/2017  . Chronic thoracic back pain 08/12/2017  . Chronic neck pain 08/12/2017  . 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  . DDD (degenerative disc disease) 10/16/2012  . Scoliosis 10/16/2012  . Rectocele without mention of uterine prolapse 10/14/2012  . Dysphagia, unspecified(787.20) 10/14/2012  . Rheumatoid arthritis ([place]) 10/07/2012  . Arthralgia 10/07/2012  . OA (osteoarthritis) 10/07/2012  . Eczema 08/08/2011  . Hyperlipidemia 07/24/2011  . Esophageal reflux 05/24/2009  . Allergic rhinitis 05/24/2009  . Lesion of ulnar nerve 05/24/2009  . Essential hypertension 02/27/2009  . Asthma 02/27/2009    Social History  Tobacco Use  . Smoking status: Never Smoker  . Smokeless tobacco: Never Used  Substance Use Topics  . Alcohol use: Yes    Alcohol/week: 6.0 - 8.0 standard drinks    Types: 6 - 8  Glasses of wine per week    Current Outpatient Medications:  .  ADVAIR DISKUS 250-50 MCG/DOSE AEPB, TAKE 1 PUFF BY MOUTH TWICE A DAY (Patient taking differently: Inhale 1 puff into the lungs 2 (two) times daily as needed (asthma). ), Disp: 180 each, Rfl: 1 .  buPROPion (WELLBUTRIN XL) 150 MG 24 hr tablet, TAKE 1 TABLET BY MOUTH EVERY DAY, Disp: 90 tablet, Rfl: 0 .  CALCIUM PO, Take 15 mLs by mouth daily., Disp: , Rfl:  .  Cholecalciferol (VITAMIN D3) 1000 units CAPS, Take 1,000 Units by mouth daily., Disp: , Rfl:  .  docusate sodium (COLACE) 100 MG capsule, Take 100 mg by mouth 2 (two) times daily as needed for mild constipation., Disp: , Rfl:  .  esomeprazole (NEXIUM) 40 MG capsule, Take 40 mg by mouth See admin instructions. Take 40 mg by mouth daily. May take additional 40 mg if needed for acid reflux, Disp: , Rfl:  .  estradiol (ESTRACE) 0.1 MG/GM vaginal cream, 1 gram vaginally twice weekly, Disp: 42.5 g, Rfl: 1 .  fluticasone (FLONASE) 50 MCG/ACT nasal spray, Place 2 sprays into both nostrils daily., Disp: 16 g, Rfl: 6 .  gabapentin (NEURONTIN) 100 MG capsule, TAKE 1 CAPSULE BY MOUTH AT BEDTIME (WITH 300MG  CAPSULE), Disp: 90 capsule, Rfl: 0 .  gabapentin (NEURONTIN) 300 MG capsule, TAKE 1 CAPSULE BY MOUTH EVERYDAY AT BEDTIME, Disp: 90 capsule, Rfl: 0 .  hydrochlorothiazide (HYDRODIURIL) 12.5 MG tablet, Take 1 tablet (12.5 mg total) by mouth daily., Disp: 90 tablet, Rfl: 1 .  ibuprofen (ADVIL,MOTRIN) 200 MG tablet, Take 200 mg by mouth every 6 (six) hours as needed., Disp: , Rfl:  .  venlafaxine XR (EFFEXOR-XR) 37.5 MG 24 hr capsule, TAKE 1 CAPSULE BY MOUTH EVERY DAY WITH BREAKFAST, Disp: 90 capsule, Rfl: 0  Allergies  Allergen Reactions  . Dilaudid [Hydromorphone Hcl] Hives, Shortness Of Breath and Itching  . Glucagon Anaphylaxis  . Lactose Intolerance (Gi)   . Wasp Venom Itching and Swelling    Objective:   VITALS: Per patient if applicable, see vitals. GENERAL: Alert, appears  well and in no acute distress. HEENT: Atraumatic, conjunctiva clear, no obvious abnormalities on inspection of external nose and ears. NECK: Normal movements of the head and neck. CARDIOPULMONARY: No increased WOB. Speaking in clear sentences. I:E ratio WNL.  MS: Moves all visible extremities without noticeable abnormality. PSYCH: Pleasant and cooperative, well-groomed. Speech normal rate and rhythm. Affect is appropriate. Insight and judgement are appropriate. Attention is focused, linear, and appropriate.  NEURO: CN grossly intact. Oriented as arrived to appointment on time with no prompting. Moves both UE equally.  SKIN: No obvious lesions, wounds, erythema, or cyanosis noted on face or hands.  Depression screen PHQ 2/9 06/02/2017  Decreased Interest 0  Down, Depressed, Hopeless 0  PHQ - 2 Score 0    Assessment and Plan:   Beth Peters was seen today for follow-up.  Diagnoses and all orders for this visit:  Seasonal allergic rhinitis due to pollen -     montelukast (SINGULAIR) 10 MG tablet; Take 1 tablet (10 mg total) by mouth at bedtime. -     azelastine (ASTELIN) 0.1 % nasal spray; Place 2 sprays into both nostrils at bedtime. Use in each nostril as  directed  Bacterial sinusitis -     azithromycin (ZITHROMAX) 250 MG tablet; 2 po on day one, then one po daily until gone. -     predniSONE (DELTASONE) 5 MG tablet; 6-5-4-3-2-1-off  Deviated septum    . COVID-19 Education: The signs and symptoms of COVID-19 were discussed with the patient and how to seek care for testing if needed. The importance of social distancing was discussed today. . Reviewed expectations re: course of current medical issues. . Discussed self-management of symptoms. . Outlined signs and symptoms indicating need for more acute intervention. . Patient verbalized understanding and all questions were answered. Marland Kitchen Health Maintenance issues including appropriate healthy diet, exercise, and smoking avoidance were  discussed with patient. . See orders for this visit as documented in the electronic medical record.  Briscoe Deutscher, DO  Records requested if needed. Time spent: 25 minutes, of which >50% was spent in obtaining information about her symptoms, reviewing her previous labs, evaluations, and treatments, counseling her about her condition (please see the discussed topics above), and developing a plan to further investigate it; she had a number of questions which I addressed.

## 2018-12-04 ENCOUNTER — Encounter: Payer: Self-pay | Admitting: Family Medicine

## 2018-12-04 ENCOUNTER — Ambulatory Visit (INDEPENDENT_AMBULATORY_CARE_PROVIDER_SITE_OTHER): Payer: PPO | Admitting: Family Medicine

## 2018-12-04 ENCOUNTER — Other Ambulatory Visit: Payer: Self-pay

## 2018-12-04 VITALS — Ht 63.5 in | Wt 160.0 lb

## 2018-12-04 DIAGNOSIS — J301 Allergic rhinitis due to pollen: Secondary | ICD-10-CM | POA: Diagnosis not present

## 2018-12-04 DIAGNOSIS — J342 Deviated nasal septum: Secondary | ICD-10-CM

## 2018-12-04 DIAGNOSIS — B9689 Other specified bacterial agents as the cause of diseases classified elsewhere: Secondary | ICD-10-CM

## 2018-12-04 DIAGNOSIS — J329 Chronic sinusitis, unspecified: Secondary | ICD-10-CM

## 2018-12-04 MED ORDER — AZELASTINE HCL 0.1 % NA SOLN: 2.0000 | Freq: Every day | NASAL | 12 refills | Status: DC

## 2018-12-04 MED ORDER — MONTELUKAST SODIUM 10 MG PO TABS: 10.0000 mg | ORAL_TABLET | Freq: Every day | ORAL | 3 refills | Status: DC

## 2018-12-04 MED ORDER — AZITHROMYCIN 250 MG PO TABS: ORAL_TABLET | ORAL | 0 refills | Status: DC

## 2018-12-04 MED ORDER — PREDNISONE 5 MG PO TABS: ORAL_TABLET | ORAL | 0 refills | Status: DC

## 2018-12-05 DIAGNOSIS — J342 Deviated nasal septum: Secondary | ICD-10-CM | POA: Insufficient documentation

## 2019-01-05 NOTE — Progress Notes (Signed)
68 y.o. G2P2002 Married White or Caucasian Not Hispanic or Latino female here for annual exam. She is s/p TLH/BSO/rectocele repair in 12/19. She is doing well. She has done much better with constipation, having normal BM's.  Not sexually active, not a problem.  She is on 400 mg of gabapentin qhs for vasomotor symptoms, also on Effexor.  She is still having vasomotor symptoms, overall tolerable.    She has osteopenia, she tried evista had joint pain so she stopped it. Due for a DEXA. She has arthritis everywhere.     Patient's last menstrual period was 09/23/2012.          Sexually active: Yes.    The current method of family planning is post menopausal status.    Exercising: Yes walking Smoker:  no  Health Maintenance: Pap:  11/21/2017 WNL NEG HPV History of abnormal Pap:  no MMG:   08/10/2018 Birads 1 negative Colonoscopy:  09/2015 Normal per patient in [place], 01/22/2019 is next colonoscopy  BMD:   08/2016 Osteopenia per patient in [place] TDaP:  Unsure, thinks UTD  Gardasil: N/A   reports that she has never smoked. She has never used smokeless tobacco. She reports current alcohol use of about 8.0 standard drinks of alcohol per week. She reports that she does not use drugs. Retired. Kids are local.   Past Medical History:  Diagnosis Date  . Achalasia of esophagus    s/p  heller myotomy hiatal hernia repair 2006  and x3  dilatation's  . Anemia   . Asthma   . Chronic back pain    lumbar and thoracic  . Chronic constipation   . Cubital tunnel syndrome, bilateral   . Degenerative arthritis of spine   . Esophageal dysmotility    ultrasound 11-15-2011  . Family history of adverse reaction to anesthesia    sister and mother-- ponv  . Family history of bladder cancer   . Family history of brain cancer   . Family history of breast cancer   . Family history of lung cancer   . Family history of prostate cancer   . Family history of rectal cancer   . Family history of testicular cancer   .  Family history of uterine cancer   . Gastrointestinal dysmotility   . GERD (gastroesophageal reflux disease)   . Hand tingling    fingers bilateral hand due to bilateral cubital tunnel syndrome  . History of esophageal spasm    s/p  esophageal dilation  . History of hiatal hernia    s/p  repair 2006  . Hypertension   . Lactose intolerance   . 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 OA (osteoarthritis)   . Osteopenia   . Panic disorder   . Polyarthralgia   . PONV (postoperative nausea and vomiting)   . Raynauds syndrome   . Scoliosis of thoracic spine 8/01  . Seasonal affective disorder ([place])   . SUI (stress urinary incontinence, female)   . Superficial thrombophlebitis    x2  left lower leg superficial in  2006  prior to vein stripping     Past Surgical History:  Procedure Laterality Date  . CYSTOSCOPY N/A 07/14/2018   Procedure: CYSTOSCOPY;  Surgeon: Salvadore Dom, MD;  Location: [facility];  Service: Gynecology;  Laterality: N/A;  . D & C HYSTEROSCOPY W/ RESECTION FIBROID  09-21-2002  dr Mohammed Kindle  [place] W/ RESECTION POLYP  12-24-2010  dr Joan Flores  [facility]  .  DILATION AND CURETTAGE OF UTERUS    . ESOPHAGEAL DILATION  2013  --- [facility]   Omega dilation for Achalasia  . ESOPHAGOGASTRODUODENOSCOPY N/A 09/20/2017   Procedure: ESOPHAGOGASTRODUODENOSCOPY (EGD);  Surgeon: Clarene Essex, MD;  Location: [facility];  Service: Endoscopy;  Laterality: N/A;  . HELLER MYOTOMY  0160   fundoplication repair hiatal hernia  . KNEE ARTHROSCOPY Left 2006 &  09-27-2003  . KNEE ARTHROSCOPY W/ SYNOVECTOMY Right 09-20-2009  dr Wynelle Link  [facility]  . KNEE SURGERY Right 1962  . RECTOCELE REPAIR N/A 07/14/2018   Procedure: POSTERIOR REPAIR (RECTOCELE);  Surgeon: Salvadore Dom, MD;  Location: [facility];  Service: Gynecology;  Laterality: N/A;  . ROTATOR CUFF REPAIR Right 09/2011  . TONSILLECTOMY AND ADENOIDECTOMY  1961  . TOTAL KNEE ARTHROPLASTY Bilateral  left 02-05-2006 ;  right 08-14-2007   dr Wynelle Link  [facility]  . TOTAL KNEE REVISION Right 11/26/2017   Procedure: Right knee polyethylene revision;  Surgeon: Gaynelle Arabian, MD;  Location: [facility];  Service: Orthopedics;  Laterality: Right;  Adductor Block  . VARICOSE VEIN SURGERY  09/2005    Current Outpatient Medications  Medication Sig Dispense Refill  . ADVAIR DISKUS 250-50 MCG/DOSE AEPB TAKE 1 PUFF BY MOUTH TWICE A DAY (Patient taking differently: Inhale 1 puff into the lungs 2 (two) times daily as needed (asthma). ) 180 each 1  . azelastine (ASTELIN) 0.1 % nasal spray Place 2 sprays into both nostrils at bedtime. Use in each nostril as directed 30 mL 12  . azithromycin (ZITHROMAX) 250 MG tablet 2 po on day one, then one po daily until gone. 6 tablet 0  . buPROPion (WELLBUTRIN XL) 150 MG 24 hr tablet TAKE 1 TABLET BY MOUTH EVERY DAY 90 tablet 0  . CALCIUM PO Take 15 mLs by mouth daily.    . Cholecalciferol (VITAMIN D3) 1000 units CAPS Take 1,000 Units by mouth daily.    Marland Kitchen docusate sodium (COLACE) 100 MG capsule Take 100 mg by mouth 2 (two) times daily as needed for mild constipation.    Marland Kitchen esomeprazole (NEXIUM) 40 MG capsule Take 40 mg by mouth See admin instructions. Take 40 mg by mouth daily. May take additional 40 mg if needed for acid reflux    . gabapentin (NEURONTIN) 100 MG capsule TAKE 1 CAPSULE BY MOUTH AT BEDTIME (WITH 300MG  CAPSULE) 90 capsule 0  . gabapentin (NEURONTIN) 300 MG capsule TAKE 1 CAPSULE BY MOUTH EVERYDAY AT BEDTIME 90 capsule 0  . hydrochlorothiazide (HYDRODIURIL) 12.5 MG tablet Take 1 tablet (12.5 mg total) by mouth daily. 90 tablet 1  . ibuprofen (ADVIL,MOTRIN) 200 MG tablet Take 200 mg by mouth every 6 (six) hours as needed.    . montelukast (SINGULAIR) 10 MG tablet Take 1 tablet (10 mg total) by mouth at bedtime. 30 tablet 3  . venlafaxine XR (EFFEXOR-XR) 37.5 MG 24 hr capsule TAKE 1 CAPSULE BY MOUTH EVERY DAY WITH BREAKFAST 90 capsule 0   No current  facility-administered medications for this visit.     Family History  Problem Relation Age of Onset  . Hypertension Father   . Heart disease Father   . Stroke Father   . Cancer Father        Testicular, Bladder, Prostate, Lung  . Heart disease Mother   . Stroke Mother   . Osteoporosis Mother   . Uterine cancer Sister 57  . Breast cancer Sister   . Cancer Sister        rectal, liver,lymph nodes  .  Diabetes Maternal Grandfather   . Heart disease Maternal Grandfather   . Diabetes Paternal Grandmother   . Breast cancer Maternal Aunt     Review of Systems  Constitutional: Negative.   HENT: Negative.   Eyes: Negative.   Respiratory: Negative.   Cardiovascular: Negative.   Gastrointestinal: Negative.   Endocrine: Negative.   Genitourinary: Negative.   Musculoskeletal: Negative.   Skin: Negative.   Neurological: Negative.   Hematological: Negative.   Psychiatric/Behavioral: Negative.     Exam:   BP 128/80 (BP Location: Right Arm, Patient Position: Sitting, Cuff Size: Normal)   Pulse 76   Temp 97.8 F (36.6 C) (Skin)   Ht 5' 2.6" (1.59 m)   Wt 161 lb 12.8 oz (73.4 kg)   LMP 09/23/2012   BMI 29.03 kg/m   Weight change: @WEIGHTCHANGE @ Height:   Height: 5' 2.6" (159 cm)  Ht Readings from Last 3 Encounters:  01/07/19 5' 2.6" (1.59 m)  12/04/18 5' 3.5" (1.613 m)  07/14/18 5' 3.5" (1.613 m)    General appearance: alert, cooperative and appears stated age Head: Normocephalic, without obvious abnormality, atraumatic Neck: no adenopathy, supple, symmetrical, trachea midline and thyroid normal to inspection and palpation Lungs: clear to auscultation bilaterally Cardiovascular: regular rate and rhythm Breasts: normal appearance, no masses or tenderness Abdomen: soft, non-tender; non distended,  no masses,  no organomegaly Extremities: extremities normal, atraumatic, no cyanosis or edema Skin: Skin color, texture, turgor normal. No rashes or lesions Lymph nodes: Cervical,  supraclavicular, and axillary nodes normal. No abnormal inguinal nodes palpated Neurologic: Grossly normal   Pelvic: External genitalia:  no lesions              Urethra:  normal appearing urethra with no masses, tenderness or lesions              Bartholins and Skenes: normal                 Vagina: atrophic appearing vagina with normal color and discharge, no lesions. No prolapse.               Cervix: absent               Bimanual Exam:  Uterus:  uterus absent              Adnexa: no mass, fullness, tenderness               Rectovaginal: Confirms               Anus:  normal sphincter tone, no lesions  Chaperone was present for exam.  A:  Well Woman with normal exam  Elevated risk of breast cancer  Osteopenia  P:   No pap needed  Mammogram UTD  Colonoscopy scheduled  DEXA ordered  Discussed breast self exam  Discussed calcium and vit D intake  Screening labs with Dr [name]

## 2019-01-07 ENCOUNTER — Other Ambulatory Visit: Payer: Self-pay

## 2019-01-07 ENCOUNTER — Encounter: Payer: Self-pay | Admitting: Obstetrics and Gynecology

## 2019-01-07 ENCOUNTER — Ambulatory Visit (INDEPENDENT_AMBULATORY_CARE_PROVIDER_SITE_OTHER): Payer: PPO | Admitting: Obstetrics and Gynecology

## 2019-01-07 ENCOUNTER — Telehealth: Payer: Self-pay | Admitting: *Deleted

## 2019-01-07 VITALS — BP 128/80 | HR 76 | Temp 97.8°F | Ht 62.6 in | Wt 161.8 lb

## 2019-01-07 DIAGNOSIS — N951 Menopausal and female climacteric states: Secondary | ICD-10-CM

## 2019-01-07 DIAGNOSIS — Z8739 Personal history of other diseases of the musculoskeletal system and connective tissue: Secondary | ICD-10-CM | POA: Diagnosis not present

## 2019-01-07 DIAGNOSIS — E2839 Other primary ovarian failure: Secondary | ICD-10-CM

## 2019-01-07 DIAGNOSIS — Z01419 Encounter for gynecological examination (general) (routine) without abnormal findings: Secondary | ICD-10-CM

## 2019-01-07 MED ORDER — GABAPENTIN 100 MG PO CAPS: ORAL_CAPSULE | ORAL | 3 refills | Status: DC

## 2019-01-07 MED ORDER — VENLAFAXINE HCL ER 37.5 MG PO CP24: ORAL_CAPSULE | ORAL | 3 refills | Status: DC

## 2019-01-07 MED ORDER — GABAPENTIN 300 MG PO CAPS: ORAL_CAPSULE | ORAL | 3 refills | Status: DC

## 2019-01-07 NOTE — Telephone Encounter (Signed)
-----   Message from Salvadore Dom, MD sent at 01/07/2019  1:56 PM EDT ----- Warden Fillers, She needs her breast MRI, the order is in, not scheduled. Thank you, Sharee Pimple

## 2019-01-07 NOTE — Patient Instructions (Signed)
EXERCISE AND DIET:  We recommended that you start or continue a regular exercise program for good health. Regular exercise means any activity that makes your heart beat faster and makes you sweat.  We recommend exercising at least 30 minutes per day at least 3 days a week, preferably 4 or 5.  We also recommend a diet low in fat and sugar.  Inactivity, poor dietary choices and obesity can cause diabetes, heart attack, stroke, and kidney damage, among others.    ALCOHOL AND SMOKING:  Women should limit their alcohol intake to no more than 7 drinks/beers/glasses of wine (combined, not each!) per week. Moderation of alcohol intake to this level decreases your risk of breast cancer and liver damage. And of course, no recreational drugs are part of a healthy lifestyle.  And absolutely no smoking or even second hand smoke. Most people know smoking can cause heart and lung diseases, but did you know it also contributes to weakening of your bones? Aging of your skin?  Yellowing of your teeth and nails?  CALCIUM AND VITAMIN D:  Adequate intake of calcium and Vitamin D are recommended.  The recommendations for exact amounts of these supplements seem to change often, but generally speaking 1,200 mg of calcium (between diet and supplement) and 800 units of Vitamin D per day seems prudent. Certain women may benefit from higher intake of Vitamin D.  If you are among these women, your doctor will have told you during your visit.    PAP SMEARS:  Pap smears, to check for cervical cancer or precancers,  have traditionally been done yearly, although recent scientific advances have shown that most women can have pap smears less often.  However, every woman still should have a physical exam from her gynecologist every year. It will include a breast check, inspection of the vulva and vagina to check for abnormal growths or skin changes, a visual exam of the cervix, and then an exam to evaluate the size and shape of the uterus and  ovaries.  And after 67 years of age, a rectal exam is indicated to check for rectal cancers. We will also provide age appropriate advice regarding health maintenance, like when you should have certain vaccines, screening for sexually transmitted diseases, bone density testing, colonoscopy, mammograms, etc.   MAMMOGRAMS:  All women over 40 years old should have a yearly mammogram. Many facilities now offer a "3D" mammogram, which may cost around $50 extra out of pocket. If possible,  we recommend you accept the option to have the 3D mammogram performed.  It both reduces the number of women who will be called back for extra views which then turn out to be normal, and it is better than the routine mammogram at detecting truly abnormal areas.    COLON CANCER SCREENING: Now recommend starting at age 45. At this time colonoscopy is not covered for routine screening until 50. There are take home tests that can be done between 45-49.   COLONOSCOPY:  Colonoscopy to screen for colon cancer is recommended for all women at age 50.  We know, you hate the idea of the prep.  We agree, BUT, having colon cancer and not knowing it is worse!!  Colon cancer so often starts as a polyp that can be seen and removed at colonscopy, which can quite literally save your life!  And if your first colonoscopy is normal and you have no family history of colon cancer, most women don't have to have it again for   10 years.  Once every ten years, you can do something that may end up saving your life, right?  We will be happy to help you get it scheduled when you are ready.  Be sure to check your insurance coverage so you understand how much it will cost.  It may be covered as a preventative service at no cost, but you should check your particular policy.      Breast Self-Awareness Breast self-awareness means being familiar with how your breasts look and feel. It involves checking your breasts regularly and reporting any changes to your  health care provider. Practicing breast self-awareness is important. A change in your breasts can be a sign of a serious medical problem. Being familiar with how your breasts look and feel allows you to find any problems early, when treatment is more likely to be successful. All women should practice breast self-awareness, including women who have had breast implants. How to do a breast self-exam One way to learn what is normal for your breasts and whether your breasts are changing is to do a breast self-exam. To do a breast self-exam: Look for Changes  1. Remove all the clothing above your waist. 2. Stand in front of a mirror in a room with good lighting. 3. Put your hands on your hips. 4. Push your hands firmly downward. 5. Compare your breasts in the mirror. Look for differences between them (asymmetry), such as: ? Differences in shape. ? Differences in size. ? Puckers, dips, and bumps in one breast and not the other. 6. Look at each breast for changes in your skin, such as: ? Redness. ? Scaly areas. 7. Look for changes in your nipples, such as: ? Discharge. ? Bleeding. ? Dimpling. ? Redness. ? A change in position. Feel for Changes Carefully feel your breasts for lumps and changes. It is best to do this while lying on your back on the floor and again while sitting or standing in the shower or tub with soapy water on your skin. Feel each breast in the following way:  Place the arm on the side of the breast you are examining above your head.  Feel your breast with the other hand.  Start in the nipple area and make  inch (2 cm) overlapping circles to feel your breast. Use the pads of your three middle fingers to do this. Apply light pressure, then medium pressure, then firm pressure. The light pressure will allow you to feel the tissue closest to the skin. The medium pressure will allow you to feel the tissue that is a little deeper. The firm pressure will allow you to feel the tissue  close to the ribs.  Continue the overlapping circles, moving downward over the breast until you feel your ribs below your breast.  Move one finger-width toward the center of the body. Continue to use the  inch (2 cm) overlapping circles to feel your breast as you move slowly up toward your collarbone.  Continue the up and down exam using all three pressures until you reach your armpit.  Write Down What You Find  Write down what is normal for each breast and any changes that you find. Keep a written record with breast changes or normal findings for each breast. By writing this information down, you do not need to depend only on memory for size, tenderness, or location. Write down where you are in your menstrual cycle, if you are still menstruating. If you are having trouble noticing differences   in your breasts, do not get discouraged. With time you will become more familiar with the variations in your breasts and more comfortable with the exam. How often should I examine my breasts? Examine your breasts every month. If you are breastfeeding, the best time to examine your breasts is after a feeding or after using a breast pump. If you menstruate, the best time to examine your breasts is 5-7 days after your period is over. During your period, your breasts are lumpier, and it may be more difficult to notice changes. When should I see my health care provider? See your health care provider if you notice:  A change in shape or size of your breasts or nipples.  A change in the skin of your breast or nipples, such as a reddened or scaly area.  Unusual discharge from your nipples.  A lump or thick area that was not there before.  Pain in your breasts.  Anything that concerns you.  

## 2019-01-07 NOTE — Telephone Encounter (Signed)
Call to patient. Patient states now not a good time to talk, will return call to office at a later time.     Per review of Epic order for breast MRI current, expires 05/2019. [facility] contacted patient to schedule on 05/2018. Patient needs to contact [facility] directly to schedule breast MRI, our office will precert once scheduled.

## 2019-01-07 NOTE — Telephone Encounter (Signed)
Spoke with patient, advised as seen below. Patient will contact [facility] directly to schedule breast MRI, contact info provided. Advised patient once MRI is scheduled our office will precert. Advised to return all to office if any additional assistance is needed. Patient verbalizes understanding.   Routing to provider for final review. Patient is agreeable to disposition. Will close encounter.  Cc: Lerry Liner

## 2019-01-07 NOTE — Telephone Encounter (Signed)
Patient is returning call to Jill. °

## 2019-01-22 DIAGNOSIS — K5730 Diverticulosis of large intestine without perforation or abscess without bleeding: Secondary | ICD-10-CM | POA: Diagnosis not present

## 2019-01-22 DIAGNOSIS — D122 Benign neoplasm of ascending colon: Secondary | ICD-10-CM | POA: Diagnosis not present

## 2019-01-22 DIAGNOSIS — R195 Other fecal abnormalities: Secondary | ICD-10-CM | POA: Diagnosis not present

## 2019-01-22 LAB — HM COLONOSCOPY

## 2019-01-28 ENCOUNTER — Other Ambulatory Visit: Payer: Self-pay | Admitting: Family Medicine

## 2019-01-29 ENCOUNTER — Other Ambulatory Visit: Payer: Self-pay | Admitting: Family Medicine

## 2019-01-29 ENCOUNTER — Other Ambulatory Visit: Payer: Self-pay | Admitting: Obstetrics and Gynecology

## 2019-01-29 DIAGNOSIS — E2839 Other primary ovarian failure: Secondary | ICD-10-CM

## 2019-01-29 DIAGNOSIS — Z8739 Personal history of other diseases of the musculoskeletal system and connective tissue: Secondary | ICD-10-CM

## 2019-02-17 ENCOUNTER — Ambulatory Visit
Admission: RE | Admit: 2019-02-17 | Discharge: 2019-02-17 | Disposition: A | Discharge status: Home / Self Care | Payer: PPO | Source: Ambulatory Visit | Attending: Obstetrics and Gynecology | Admitting: Obstetrics and Gynecology

## 2019-02-17 ENCOUNTER — Other Ambulatory Visit: Payer: Self-pay

## 2019-02-17 DIAGNOSIS — Z1231 Encounter for screening mammogram for malignant neoplasm of breast: Secondary | ICD-10-CM

## 2019-02-17 DIAGNOSIS — N6489 Other specified disorders of breast: Secondary | ICD-10-CM | POA: Diagnosis not present

## 2019-02-17 MED ORDER — GADOBUTROL 1 MMOL/ML IV SOLN
7.0000 mL | Freq: Once | INTRAVENOUS | Status: AC | PRN
  Administered 2019-02-17: 7 mL via INTRAVENOUS

## 2019-02-22 ENCOUNTER — Telehealth: Payer: Self-pay | Admitting: Family Medicine

## 2019-02-22 NOTE — Telephone Encounter (Signed)
I left a message asking the patient to call me at [phone] to schedule AWV with Courtney. VDM (Dee-Dee) °

## 2019-03-01 ENCOUNTER — Other Ambulatory Visit: Payer: Self-pay | Admitting: Family Medicine

## 2019-03-01 DIAGNOSIS — J301 Allergic rhinitis due to pollen: Secondary | ICD-10-CM

## 2019-04-06 ENCOUNTER — Ambulatory Visit
Admission: RE | Admit: 2019-04-06 | Discharge: 2019-04-06 | Disposition: A | Discharge status: Home / Self Care | Payer: PPO | Source: Ambulatory Visit | Attending: Obstetrics and Gynecology | Admitting: Obstetrics and Gynecology

## 2019-04-06 ENCOUNTER — Other Ambulatory Visit: Payer: Self-pay

## 2019-04-06 DIAGNOSIS — Z8739 Personal history of other diseases of the musculoskeletal system and connective tissue: Secondary | ICD-10-CM

## 2019-04-06 DIAGNOSIS — Z780 Asymptomatic menopausal state: Secondary | ICD-10-CM | POA: Diagnosis not present

## 2019-04-06 DIAGNOSIS — M85852 Other specified disorders of bone density and structure, left thigh: Secondary | ICD-10-CM | POA: Diagnosis not present

## 2019-04-06 DIAGNOSIS — E2839 Other primary ovarian failure: Secondary | ICD-10-CM

## 2019-04-27 DIAGNOSIS — H9192 Unspecified hearing loss, left ear: Secondary | ICD-10-CM | POA: Insufficient documentation

## 2019-04-27 DIAGNOSIS — K220 Achalasia of cardia: Secondary | ICD-10-CM | POA: Diagnosis not present

## 2019-04-27 DIAGNOSIS — K219 Gastro-esophageal reflux disease without esophagitis: Secondary | ICD-10-CM | POA: Insufficient documentation

## 2019-04-27 DIAGNOSIS — H93293 Other abnormal auditory perceptions, bilateral: Secondary | ICD-10-CM | POA: Diagnosis not present

## 2019-05-11 ENCOUNTER — Telehealth: Payer: Self-pay | Admitting: Physical Therapy

## 2019-05-11 NOTE — Telephone Encounter (Signed)
Copied from [place] [id number]. Topic: General - Other >> May 11, 2019 12:28 PM [name] wrote: Patient would like to know if they should get pneumonia shot and when. Please advise.

## 2019-05-11 NOTE — Telephone Encounter (Signed)
Spoke to pt told her she does not need any Pneumonia shots she has had both and is up to date. Pt verbalized understanding.

## 2019-05-12 ENCOUNTER — Other Ambulatory Visit: Payer: Self-pay | Admitting: Family Medicine

## 2019-05-14 ENCOUNTER — Ambulatory Visit (INDEPENDENT_AMBULATORY_CARE_PROVIDER_SITE_OTHER): Payer: PPO

## 2019-05-14 ENCOUNTER — Other Ambulatory Visit: Payer: Self-pay

## 2019-05-14 ENCOUNTER — Encounter: Payer: Self-pay | Admitting: Family Medicine

## 2019-05-14 DIAGNOSIS — Z23 Encounter for immunization: Secondary | ICD-10-CM

## 2019-05-26 ENCOUNTER — Ambulatory Visit: Payer: PPO | Admitting: Physician Assistant

## 2019-06-01 ENCOUNTER — Ambulatory Visit (INDEPENDENT_AMBULATORY_CARE_PROVIDER_SITE_OTHER): Payer: PPO | Admitting: Physician Assistant

## 2019-06-01 ENCOUNTER — Other Ambulatory Visit: Payer: Self-pay

## 2019-06-01 ENCOUNTER — Encounter: Payer: Self-pay | Admitting: Physician Assistant

## 2019-06-01 VITALS — BP 140/90 | HR 65 | Temp 98.1°F | Ht 62.5 in | Wt 163.2 lb

## 2019-06-01 DIAGNOSIS — R5383 Other fatigue: Secondary | ICD-10-CM | POA: Diagnosis not present

## 2019-06-01 DIAGNOSIS — K220 Achalasia of cardia: Secondary | ICD-10-CM | POA: Diagnosis not present

## 2019-06-01 DIAGNOSIS — N393 Stress incontinence (female) (male): Secondary | ICD-10-CM | POA: Insufficient documentation

## 2019-06-01 DIAGNOSIS — R112 Nausea with vomiting, unspecified: Secondary | ICD-10-CM | POA: Insufficient documentation

## 2019-06-01 DIAGNOSIS — I10 Essential (primary) hypertension: Secondary | ICD-10-CM | POA: Diagnosis not present

## 2019-06-01 DIAGNOSIS — I7300 Raynaud's syndrome without gangrene: Secondary | ICD-10-CM | POA: Insufficient documentation

## 2019-06-01 DIAGNOSIS — E559 Vitamin D deficiency, unspecified: Secondary | ICD-10-CM | POA: Diagnosis not present

## 2019-06-01 DIAGNOSIS — D649 Anemia, unspecified: Secondary | ICD-10-CM | POA: Insufficient documentation

## 2019-06-01 DIAGNOSIS — K5909 Other constipation: Secondary | ICD-10-CM | POA: Insufficient documentation

## 2019-06-01 DIAGNOSIS — L659 Nonscarring hair loss, unspecified: Secondary | ICD-10-CM

## 2019-06-01 DIAGNOSIS — Z1159 Encounter for screening for other viral diseases: Secondary | ICD-10-CM

## 2019-06-01 DIAGNOSIS — F338 Other recurrent depressive disorders: Secondary | ICD-10-CM | POA: Insufficient documentation

## 2019-06-01 DIAGNOSIS — I809 Phlebitis and thrombophlebitis of unspecified site: Secondary | ICD-10-CM | POA: Insufficient documentation

## 2019-06-01 DIAGNOSIS — G5623 Lesion of ulnar nerve, bilateral upper limbs: Secondary | ICD-10-CM | POA: Insufficient documentation

## 2019-06-01 DIAGNOSIS — E739 Lactose intolerance, unspecified: Secondary | ICD-10-CM | POA: Insufficient documentation

## 2019-06-01 LAB — CBC WITH DIFFERENTIAL/PLATELET
Basophils Absolute: 0.1 10*3/uL (ref 0.0–0.1)
Basophils Relative: 1.3 % (ref 0.0–3.0)
Eosinophils Absolute: 0.2 10*3/uL (ref 0.0–0.7)
Eosinophils Relative: 3.7 % (ref 0.0–5.0)
HCT: 44.3 % (ref 36.0–46.0)
Hemoglobin: 15.1 g/dL — ABNORMAL HIGH (ref 12.0–15.0)
Lymphocytes Relative: 35.7 % (ref 12.0–46.0)
Lymphs Abs: 1.9 10*3/uL (ref 0.7–4.0)
MCHC: 34.1 g/dL (ref 30.0–36.0)
MCV: 94.1 fl (ref 78.0–100.0)
Monocytes Absolute: 0.3 10*3/uL (ref 0.1–1.0)
Monocytes Relative: 6.5 % (ref 3.0–12.0)
Neutro Abs: 2.8 10*3/uL (ref 1.4–7.7)
Neutrophils Relative %: 52.8 % (ref 43.0–77.0)
Platelets: 223 10*3/uL (ref 150.0–400.0)
RBC: 4.7 Mil/uL (ref 3.87–5.11)
RDW: 13.3 % (ref 11.5–15.5)
WBC: 5.3 10*3/uL (ref 4.0–10.5)

## 2019-06-01 LAB — VITAMIN D 25 HYDROXY (VIT D DEFICIENCY, FRACTURES): VITD: 39.25 ng/mL (ref 30.00–100.00)

## 2019-06-01 LAB — BASIC METABOLIC PANEL
BUN: 11 mg/dL (ref 6–23)
CO2: 31 mEq/L (ref 19–32)
Calcium: 9.9 mg/dL (ref 8.4–10.5)
Chloride: 97 mEq/L (ref 96–112)
Creatinine, Ser: 0.85 mg/dL (ref 0.40–1.20)
GFR: 66.69 mL/min (ref >60.00)
Glucose, Bld: 92 mg/dL (ref 70–99)
Potassium: 4.6 mEq/L (ref 3.5–5.1)
Sodium: 139 mEq/L (ref 135–145)

## 2019-06-01 LAB — TSH: TSH: 1.13 u[IU]/mL (ref 0.35–4.50)

## 2019-06-01 LAB — T4, FREE: Free T4: 0.8 ng/dL (ref 0.60–1.60)

## 2019-06-01 LAB — VITAMIN B12: Vitamin B-12: 205 pg/mL — ABNORMAL LOW (ref 211–911)

## 2019-06-01 MED ORDER — BUDESONIDE-FORMOTEROL FUMARATE 160-4.5 MCG/ACT IN AERO: 2.0000 | INHALATION_SPRAY | Freq: Two times a day (BID) | RESPIRATORY_TRACT | 0 refills | Status: DC

## 2019-06-01 NOTE — Progress Notes (Signed)
Beth Peters is a 68 y.o. female is here for transfer of care.  I acted as a Education administrator for [employer], PA-C [name], LPN  History of Present Illness:   Chief Complaint  Patient presents with   Transfer of care    from Dr. [name]   Thyroid issue    HPI  Pt is transferring care today from Dr. [name].  Fatigue/Thyroid Concerns Patient is endorsing several issues today.  She notes that she has been having some symptoms of hypothyroidism for quite some time.  She feels like her hair is falling out and is much thinner than normal x1 year, she has cold sensitivity, constipation, fatigue, weight gain, dry skin.  She states that in the past when her thyroid has been checked it has been "borderline."  Her last thyroid check on my exam was November 2018 and was normal.  She states that she does have strong family history of thyroid issues and is wondering if she can start medication for this.  Lab Results  Component Value Date   TSH 1.74 06/02/2017     HTN Currently taking hydrochlorothiazide 12.5 mg. At home blood pressure readings are: Not checked, does not have batteries for her monitor at home. Patient denies chest pain, SOB, blurred vision, dizziness, unusual headaches.  She does have some issues with some swelling intermittently, specifically if she has dietary indiscretion of salty foods.  Patient is compliant with medication. Denies excessive caffeine intake, stimulant usage, excessive alcohol intake, or increase in salt consumption.  Vitamin D deficiency She has a history of osteopenia.  She states that she takes a vitamin D supplement regularly, and it is smooth 1 and half years since her vitamin D was checked, and in Nov 2018 was found to be 26.7.  Achalasia She sees a specialist at [facility] for this.  She had a fundoplication repair in [id number].  She goes for dilations as needed at [facility].  She states that she is overdue for this.  She feels like some of this may be  contributing to some sore throat symptoms that she is having.  Throughout her whole life she has had to make significant dietary changes in order to be able to swallow foods.  There are certain supplements that she cannot take due to their size.     Health Maintenance Due  Topic Date Due   Hepatitis C Screening  01/25/1951   PNA vac Low Risk Adult (2 of 2 - PPSV23) 05/07/2019    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   OA (osteoarthritis)    Osteopenia    PONV (postoperative nausea and vomiting)    Raynauds syndrome    Seasonal affective disorder (HCC)    SUI (stress urinary incontinence, female)    Superficial thrombophlebitis    x2  left lower leg superficial in  2006  prior to vein stripping      Social History   Socioeconomic History   Marital status: Married    Spouse name: Not on file   Number of children: Not on file   Years of education: Not on file   Highest education level: Not on file  Occupational History   Not on file  Social Needs   Financial resource strain: Not on file   Food insecurity    Worry: Not on file    Inability: Not on file   Transportation needs    Medical: Not on file    Non-medical: Not on file  Tobacco Use   Smoking status: Never Smoker   Smokeless tobacco: Never Used  Substance and Sexual Activity   Alcohol use: Yes    Alcohol/week: 8.0 standard drinks    Types: 8 Glasses of wine per week   Drug use: No   Sexual activity: Yes    Partners: Male    Birth control/protection: Post-menopausal, Surgical    Comment: vasectomy, hysterectomy  Lifestyle   Physical activity    Days per week: Not on file    Minutes per session: Not on file   Stress: Not  on file  Relationships   Social connections    Talks on phone: Not on file    Gets together: Not on file    Attends religious service: Not on file    Active member of club or organization: Not on file    Attends meetings of clubs or organizations: Not on file    Relationship status: Not on file   Intimate partner violence    Fear of current or ex partner: Not on file    Emotionally abused: Not on file    Physically abused: Not on file    Forced sexual activity: Not on file  Other Topics Concern   Not on file  Social History Narrative   Not on file    Past Surgical History:  Procedure Laterality Date   CYSTOSCOPY N/A 07/14/2018   Procedure: CYSTOSCOPY;  Surgeon: Salvadore Dom, MD;  Location: [place];  Service: Gynecology;  Laterality: N/A;   D & C HYSTEROSCOPY W/ RESECTION FIBROID  09-21-2002  dr [facility]   D & C HYSTEROSCOPY W/ RESECTION POLYP  12-24-2010  dr Joan Flores  [facility]   DILATION AND CURETTAGE OF UTERUS     ESOPHAGEAL DILATION  2013  --- [facility]   Omega dilation for Achalasia   ESOPHAGOGASTRODUODENOSCOPY N/A 09/20/2017   Procedure: ESOPHAGOGASTRODUODENOSCOPY (EGD);  Surgeon: Clarene Essex, MD;  Location: [facility];  Service: Endoscopy;  Laterality: N/A;   HELLER MYOTOMY  [id number]   fundoplication repair hiatal hernia   KNEE ARTHROSCOPY Left 2006 &  09-27-2003   KNEE ARTHROSCOPY W/ SYNOVECTOMY Right 09-20-2009  dr Wynelle Link  [place] N/A 07/14/2018   Procedure: POSTERIOR REPAIR (RECTOCELE);  Surgeon: Salvadore Dom, MD;  Location: [facility];  Service: Gynecology;  Laterality: N/A;   ROTATOR CUFF REPAIR Right 09/2011   TONSILLECTOMY AND ADENOIDECTOMY  1961   TOTAL KNEE ARTHROPLASTY Bilateral left 02-05-2006 ;  right 08-14-2007   dr Wynelle Link  [facility]   TOTAL KNEE REVISION Right 11/26/2017   Procedure: Right knee polyethylene revision;  Surgeon: Gaynelle Arabian, MD;  Location: [facility];  Service: Orthopedics;  Laterality: Right;  Adductor Block   VARICOSE VEIN SURGERY  09/2005    Family History  Problem Relation Age of Onset   Hypertension Father    Heart disease Father    Stroke Father    Cancer Father        Testicular, Bladder, Prostate, Lung   Heart disease Mother    Stroke Mother    Osteoporosis Mother  Uterine cancer Sister 56   Breast cancer Sister    Cancer Sister        rectal, liver,lymph nodes   Diabetes Maternal Grandfather    Heart disease Maternal Grandfather    Diabetes Paternal Grandmother    Breast cancer Maternal Aunt     PMHx, SurgHx, SocialHx, FamHx, Medications, and Allergies were reviewed in the Visit Navigator and updated as appropriate.   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   Laryngopharyngeal reflux (LPR) 04/27/2019   Deviated septum 12/05/2018   Sacroiliac joint pain 10/29/2018   Status post laparoscopic hysterectomy 07/14/2018   Increased risk of breast cancer, 23.4% 05/10/2018   Menopausal vasomotor syndrome 05/10/2018   Genetic testing 03/19/2018   Family history of breast cancer    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   Osteopenia    Achalasia 10/28/2017   Thoracic spondylosis 10/14/2017   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   Scoliosis 10/16/2012   Rectocele without mention of uterine prolapse 10/14/2012   OA (osteoarthritis) 10/07/2012   Eczema 08/08/2011   Hyperlipidemia 07/24/2011   Allergic rhinitis 05/24/2009   Lesion of ulnar nerve 05/24/2009   Essential hypertension 02/27/2009   Asthma 02/27/2009    Lordosis of lumbar region 02/2000    Social History   Tobacco Use   Smoking status: Never Smoker   Smokeless tobacco: Never Used  Substance Use Topics   Alcohol use: Yes    Alcohol/week: 8.0 standard drinks    Types: 8 Glasses of wine per week   Drug use: No    Current Medications and Allergies:    Current Outpatient Medications:    ADVAIR DISKUS 250-50 MCG/DOSE AEPB, TAKE 1 PUFF BY MOUTH TWICE A DAY (Patient taking differently: Inhale 1 puff into the lungs 2 (two) times daily as needed (asthma). ), Disp: 180 each, Rfl: 1   buPROPion (WELLBUTRIN XL) 150 MG 24 hr tablet, TAKE 1 TABLET BY MOUTH EVERY DAY, Disp: 90 tablet, Rfl: 0   CALCIUM PO, Take 15 mLs by mouth daily., Disp: , Rfl:    Cholecalciferol (VITAMIN D3) 1000 units CAPS, Take 1,000 Units by mouth daily., Disp: , Rfl:    docusate sodium (COLACE) 100 MG capsule, Take 100 mg by mouth 2 (two) times daily as needed for mild constipation., Disp: , Rfl:    esomeprazole (NEXIUM) 40 MG capsule, Take 40 mg by mouth See admin instructions. Take 40 mg by mouth daily. May take additional 40 mg if needed for acid reflux, Disp: , Rfl:    gabapentin (NEURONTIN) 100 MG capsule, TAKE 1 CAPSULE BY MOUTH AT BEDTIME (WITH 300MG  CAPSULE), Disp: 90 capsule, Rfl: 3   gabapentin (NEURONTIN) 300 MG capsule, Take with the 100 mg tablet of gabapentin, Disp: 90 capsule, Rfl: 3   hydrochlorothiazide (HYDRODIURIL) 12.5 MG tablet, TAKE 1 TABLET BY MOUTH EVERY DAY, Disp: 90 tablet, Rfl: 1   ibuprofen (ADVIL,MOTRIN) 200 MG tablet, Take 200 mg by mouth every 6 (six) hours as needed., Disp: , Rfl:    venlafaxine XR (EFFEXOR-XR) 37.5 MG 24 hr capsule, TAKE 1 CAPSULE BY MOUTH EVERY DAY WITH BREAKFAST, Disp:  90 capsule, Rfl: 3   diclofenac Sodium (VOLTAREN) 1 % GEL, Voltaren 1 % topical gel, Disp: , Rfl:    Allergies  Allergen Reactions   Dilaudid [Hydromorphone Hcl] Hives, Shortness Of Breath and Itching   Glucagon Anaphylaxis   Lactose  Intolerance (Gi)    Wasp Venom Itching and Swelling   Wasp Venom Protein Itching and Hives    Review of Systems   ROS  Negative unless otherwise specified per HPI.   Vitals:   Vitals:   06/01/19 1056 06/01/19 1150  BP: (!) 158/90 140/90  Pulse: 75 65  Temp: 98.1 F (36.7 C)   TempSrc: Temporal   SpO2: 95%   Weight: 163 lb 4 oz (74 kg)   Height: 5' 2.5" (1.588 m)      Body mass index is 29.38 kg/m.   Physical Exam:    Physical Exam Vitals signs and nursing note reviewed.  Constitutional:      General: She is not in acute distress.    Appearance: She is well-developed. She is not ill-appearing or toxic-appearing.  Neck:     Thyroid: No thyroid mass or thyroid tenderness.  Cardiovascular:     Rate and Rhythm: Normal rate and regular rhythm.     Pulses: Normal pulses.     Heart sounds: Normal heart sounds, S1 normal and S2 normal.     Comments: 1+ edema BLE Pulmonary:     Effort: Pulmonary effort is normal.     Breath sounds: Normal breath sounds.  Skin:    General: Skin is warm and dry.     Comments: hair with thin dry texture  Neurological:     Mental Status: She is alert.     GCS: GCS eye subscore is 4. GCS verbal subscore is 5. GCS motor subscore is 6.  Psychiatric:        Speech: Speech normal.        Behavior: Behavior normal. Behavior is cooperative.      Assessment and Plan:    Levie was seen today for transfer of care and thyroid issue.  Diagnoses and all orders for this visit:  Fatigue, unspecified type; Hair loss Multifactorial.  I do think that she could have some issues with aging, as well as possible nutritional deficiency with her ongoing achalasia requiring specific dietary modifications since age 71.  Would like to update a thyroid panel, BMP, CBC, vitamin B12.  She is agreeable to vitamin B12 injections if warranted.  She also states that she would like potentially a second opinion about starting thyroid medications if her thyroid is  normal.  Will check labs and provide further recommendations. -     T4, free -     TSH -     Basic metabolic panel -     CBC with Differential -     Vitamin B12  Vitamin D deficiency We will update a vitamin D level today.  Further recommendations based on results. -     VITAMIN D 25 Hydroxy (Vit-D Deficiency, Fractures)  Encounter for screening for other viral diseases -     Hepatitis C Antibody  Essential hypertension Uncontrolled currently.  I do think that she could benefit from increasing her hydrochlorothiazide to 25 mg.  I would like to check labs and provide further recommendations based on lab results.  I did discuss that if we do increase her medication she will need to return to the office in 2 to 4 weeks for f/u visit, sooner if  concerns.  I also recommended that she purchase batteries for her blood pressure monitor and keep a record for [place].  Achalasia Recommended that she reach out to her gastroenterologist for possible dilation soon, she states that this is likely warranted given her symptoms.   Reviewed expectations re: course of current medical issues.  Discussed self-management of symptoms.  Outlined signs and symptoms indicating need for more acute intervention.  Patient verbalized understanding and all questions were answered.  See orders for this visit as documented in the electronic medical record.  Patient received an After Visit Summary.  I spent 40 minutes with this patient, greater than 50% was face-to-face time counseling regarding the above diagnoses.  Inda Coke, PA-C [place], [place] 06/01/2019  Follow-up: No follow-ups on file.

## 2019-06-01 NOTE — Patient Instructions (Addendum)
It was great to see you!  Please use the symbicort sample while we approach 2021. When you are due for refill, let us know and we can recheck the formulary for your insurance and find more affordable option.  I will be in touch via MyChart with your lab results and our plan.  I do recommend scheduling a visit with our health coach to have your Annual Wellness Visit performed. You can do this on your way out.  Take care,  Inda Coke PA-C

## 2019-06-01 NOTE — Addendum Note (Signed)
Addended by: Marian Sorrow on: 06/01/2019 03:28 PM   Modules accepted: Orders

## 2019-06-02 LAB — HEPATITIS C ANTIBODY
Hepatitis C Ab: NONREACTIVE
SIGNAL TO CUT-OFF: 0.02 (ref <1.00)

## 2019-06-07 ENCOUNTER — Other Ambulatory Visit: Payer: Self-pay

## 2019-06-08 ENCOUNTER — Ambulatory Visit (INDEPENDENT_AMBULATORY_CARE_PROVIDER_SITE_OTHER): Payer: PPO

## 2019-06-08 DIAGNOSIS — E538 Deficiency of other specified B group vitamins: Secondary | ICD-10-CM

## 2019-06-08 MED ORDER — CYANOCOBALAMIN 1000 MCG/ML IJ SOLN
1000.0000 ug | Freq: Once | INTRAMUSCULAR | Status: AC
  Administered 2019-06-08: 1000 ug via INTRAMUSCULAR

## 2019-06-08 NOTE — Progress Notes (Signed)
Per orders of Inda Coke, PA-C, injection of Vitamin b12, 1000 mcg given left deltoid IM by Kevan Ny, CMA  Patient tolerated injection well.  She will return in 1 week for her next injection.

## 2019-06-16 ENCOUNTER — Ambulatory Visit (INDEPENDENT_AMBULATORY_CARE_PROVIDER_SITE_OTHER): Payer: PPO

## 2019-06-16 ENCOUNTER — Other Ambulatory Visit: Payer: Self-pay

## 2019-06-16 DIAGNOSIS — E538 Deficiency of other specified B group vitamins: Secondary | ICD-10-CM | POA: Diagnosis not present

## 2019-06-16 MED ORDER — CYANOCOBALAMIN 1000 MCG/ML IJ SOLN
1000.0000 ug | Freq: Once | INTRAMUSCULAR | Status: AC
  Administered 2019-06-16: 1000 ug via INTRAMUSCULAR

## 2019-06-16 NOTE — Progress Notes (Addendum)
Per orders of Inda Coke, [place], injection of B12 given by Serita Sheller in left deltoid. Patient tolerated injection well. Patient will make appointment for 1 month.  Agree with the above. Inda Coke PA-C

## 2019-06-22 ENCOUNTER — Other Ambulatory Visit: Payer: Self-pay

## 2019-06-23 ENCOUNTER — Ambulatory Visit (INDEPENDENT_AMBULATORY_CARE_PROVIDER_SITE_OTHER): Payer: PPO

## 2019-06-23 DIAGNOSIS — E538 Deficiency of other specified B group vitamins: Secondary | ICD-10-CM

## 2019-06-23 MED ORDER — CYANOCOBALAMIN 1000 MCG/ML IJ SOLN
1000.0000 ug | Freq: Once | INTRAMUSCULAR | Status: AC
  Administered 2019-06-23: 1000 ug via INTRAMUSCULAR

## 2019-06-23 NOTE — Progress Notes (Signed)
Per orders of Inda Coke, [place], injection of B12 given by Serita Sheller in left deltoid. Patient tolerated injection well. Patient will make appointment for 1 month.

## 2019-06-24 ENCOUNTER — Encounter: Payer: Self-pay | Admitting: Physician Assistant

## 2019-06-24 ENCOUNTER — Ambulatory Visit (INDEPENDENT_AMBULATORY_CARE_PROVIDER_SITE_OTHER): Payer: PPO | Admitting: Physician Assistant

## 2019-06-24 VITALS — Ht 62.5 in | Wt 160.0 lb

## 2019-06-24 DIAGNOSIS — R29818 Other symptoms and signs involving the nervous system: Secondary | ICD-10-CM | POA: Diagnosis not present

## 2019-06-24 NOTE — Progress Notes (Signed)
Virtual Visit via Video   I connected with Beth Peters on 06/24/19 at  7:40 AM EST by a video enabled telemedicine application and verified that I am speaking with the correct person using two identifiers. Location patient: Home Location provider: [facility], Office Persons participating in the virtual visit: YUMNA PIZZITOLA, Inda Coke PA-C, [name], LPN   I discussed the limitations of evaluation and management by telemedicine and the availability of in person appointments. The patient expressed understanding and agreed to proceed.  I acted as a Education administrator for [employer], [employer], LPN  Subjective:   HPI:  Concerns regarding sleep apnea Pt would like to discuss having a sleep study done. Pt has had interrupted sleep for years and strong family hx of sleep apnea. Pt does snore. She feels like her symptoms are getting worse with time. Has daytime fatigue. Several family members require CPAP and she knows that this is likely in the cards for her. We had prior visit regarding fatigue on 06/01/19.  ROS: See pertinent positives and negatives per HPI.  Patient Active Problem List   Diagnosis Date Noted  . Chronic constipation   . PONV (postoperative nausea and vomiting)   . Cubital tunnel syndrome, bilateral   . Lactose intolerance   . Seasonal affective disorder ([place])   . SUI (stress urinary incontinence, female)   . Anemia   . Raynauds syndrome   . Superficial thrombophlebitis   . 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  . Laryngopharyngeal reflux (LPR) 04/27/2019  . Deviated septum 12/05/2018  . Sacroiliac joint pain 10/29/2018  . Status post laparoscopic hysterectomy 07/14/2018  . Increased risk of breast cancer, 23.4% 05/10/2018  . Menopausal vasomotor syndrome 05/10/2018  . Genetic testing 03/19/2018  . Family history of breast cancer   . 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 Osteopenia   . Achalasia 10/28/2017  . Thoracic  spondylosis 10/14/2017  . 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  . Scoliosis 10/16/2012  . Rectocele without mention of uterine prolapse 10/14/2012  . OA (osteoarthritis) 10/07/2012  . Eczema 08/08/2011  . Hyperlipidemia 07/24/2011  . Allergic rhinitis 05/24/2009  . Lesion of ulnar nerve 05/24/2009  . Essential hypertension 02/27/2009  . Asthma 02/27/2009  . Lordosis of lumbar region 02/2000    Social History   Tobacco Use  . Smoking status: Never Smoker  . Smokeless tobacco: Never Used  Substance Use Topics  . Alcohol use: Yes    Alcohol/week: 8.0 standard drinks    Types: 8 Glasses of wine per week    Current Outpatient Medications:  .  ADVAIR DISKUS 250-50 MCG/DOSE AEPB, TAKE 1 PUFF BY MOUTH TWICE A DAY (Patient taking differently: Inhale 1 puff into the lungs 2 (two) times daily as needed (asthma). ), Disp: 180 each, Rfl: 1 .  budesonide-formoterol (SYMBICORT) 160-4.5 MCG/ACT inhaler, Inhale 2 puffs into the lungs 2 (two) times daily., Disp: 1 Inhaler, Rfl: 0 .  buPROPion (WELLBUTRIN XL) 150 MG 24 hr tablet, TAKE 1 TABLET BY MOUTH EVERY DAY, Disp: 90 tablet, Rfl: 0 .  CALCIUM PO, Take 15 mLs by mouth daily., Disp: , Rfl:  .  Cholecalciferol (VITAMIN D3) 1000 units CAPS, Take 1,000 Units by mouth daily., Disp: , Rfl:  .  diclofenac Sodium (VOLTAREN) 1 % GEL, Voltaren 1 % topical gel, Disp: , Rfl:  .  docusate sodium (COLACE) 100 MG capsule, Take 100 mg by mouth 2 (two) times daily as needed for mild constipation.,  Disp: , Rfl:  .  esomeprazole (NEXIUM) 40 MG capsule, Take 40 mg by mouth See admin instructions. Take 40 mg by mouth daily. May take additional 40 mg if needed for acid reflux, Disp: , Rfl:  .  gabapentin (NEURONTIN) 100 MG capsule, TAKE 1 CAPSULE BY MOUTH AT BEDTIME (WITH 300MG  CAPSULE), Disp: 90 capsule, Rfl: 3 .  gabapentin (NEURONTIN) 300 MG capsule, Take with the 100 mg tablet of gabapentin, Disp: 90 capsule, Rfl: 3 .  hydrochlorothiazide  (HYDRODIURIL) 12.5 MG tablet, TAKE 1 TABLET BY MOUTH EVERY DAY, Disp: 90 tablet, Rfl: 1 .  ibuprofen (ADVIL,MOTRIN) 200 MG tablet, Take 200 mg by mouth every 6 (six) hours as needed., Disp: , Rfl:  .  venlafaxine XR (EFFEXOR-XR) 37.5 MG 24 hr capsule, TAKE 1 CAPSULE BY MOUTH EVERY DAY WITH BREAKFAST, Disp: 90 capsule, Rfl: 3  Allergies  Allergen Reactions  . Dilaudid [Hydromorphone Hcl] Hives, Shortness Of Breath and Itching  . Glucagon Anaphylaxis  . Lactose Intolerance (Gi)   . Wasp Venom Itching and Swelling  . Wasp Venom Protein Itching and Hives    Objective:   VITALS: Per patient if applicable, see vitals. GENERAL: Alert, appears well and in no acute distress. HEENT: Atraumatic, conjunctiva clear, no obvious abnormalities on inspection of external nose and ears. NECK: Normal movements of the head and neck. CARDIOPULMONARY: No increased WOB. Speaking in clear sentences. I:E ratio WNL.  MS: Moves all visible extremities without noticeable abnormality. PSYCH: Pleasant and cooperative, well-groomed. Speech normal rate and rhythm. Affect is appropriate. Insight and judgement are appropriate. Attention is focused, linear, and appropriate.  NEURO: CN grossly intact. Oriented as arrived to appointment on time with no prompting. Moves both UE equally.  SKIN: No obvious lesions, wounds, erythema, or cyanosis noted on face or hands.  Assessment and Plan:   Beth Peters was seen today for discuss sleep study.  Diagnoses and all orders for this visit:  Suspected sleep apnea -     Ambulatory referral to Sleep Studies   Referral to sleep studies placed today.   . Reviewed expectations re: course of current medical issues. . Discussed self-management of symptoms. . Outlined signs and symptoms indicating need for more acute intervention. . Patient verbalized understanding and all questions were answered. Marland Kitchen Health Maintenance issues including appropriate healthy diet, exercise, and smoking  avoidance were discussed with patient. . See orders for this visit as documented in the electronic medical record.  I discussed the assessment and treatment plan with the patient. The patient was provided an opportunity to ask questions and all were answered. The patient agreed with the plan and demonstrated an understanding of the instructions.   The patient was advised to call back or seek an in-person evaluation if the symptoms worsen or if the condition fails to improve as anticipated.   CMA or LPN served as scribe during this visit. History, Physical, and Plan performed by medical provider. The above documentation has been reviewed and is accurate and complete.   Inda Coke, [place] 06/24/2019

## 2019-06-29 ENCOUNTER — Other Ambulatory Visit: Payer: Self-pay

## 2019-06-30 ENCOUNTER — Ambulatory Visit (INDEPENDENT_AMBULATORY_CARE_PROVIDER_SITE_OTHER): Payer: PPO

## 2019-06-30 DIAGNOSIS — E538 Deficiency of other specified B group vitamins: Secondary | ICD-10-CM | POA: Diagnosis not present

## 2019-06-30 MED ORDER — CYANOCOBALAMIN 1000 MCG/ML IJ SOLN
1000.0000 ug | Freq: Once | INTRAMUSCULAR | Status: AC
  Administered 2019-06-30: 11:00:00 1000 ug via INTRAMUSCULAR

## 2019-06-30 NOTE — Progress Notes (Signed)
Per orders of Samantha Worley, PA, injection of B 12 given by Corydon Schweiss [employer] in right deltoid. Patient tolerated injection well. Patient will make appointment for 1month   

## 2019-07-01 ENCOUNTER — Encounter: Payer: Self-pay | Admitting: Neurology

## 2019-07-01 ENCOUNTER — Other Ambulatory Visit: Payer: Self-pay

## 2019-07-01 ENCOUNTER — Ambulatory Visit (INDEPENDENT_AMBULATORY_CARE_PROVIDER_SITE_OTHER): Payer: PPO | Admitting: Neurology

## 2019-07-01 VITALS — BP 162/99 | HR 71 | Temp 97.1°F | Ht 62.5 in | Wt 164.0 lb

## 2019-07-01 DIAGNOSIS — K219 Gastro-esophageal reflux disease without esophagitis: Secondary | ICD-10-CM

## 2019-07-01 DIAGNOSIS — K2100 Gastro-esophageal reflux disease with esophagitis, without bleeding: Secondary | ICD-10-CM

## 2019-07-01 DIAGNOSIS — R0681 Apnea, not elsewhere classified: Secondary | ICD-10-CM | POA: Diagnosis not present

## 2019-07-01 DIAGNOSIS — R0683 Snoring: Secondary | ICD-10-CM

## 2019-07-01 DIAGNOSIS — I10 Essential (primary) hypertension: Secondary | ICD-10-CM

## 2019-07-01 NOTE — Progress Notes (Signed)
SLEEP MEDICINE CLINIC    Provider:  Larey Seat, MD  Primary Care Physician:  Inda Coke, [place] [address]     Referring Provider: Inda Coke, [place] [address] [place],  [place] [address]          Chief Complaint according to patient   Patient presents with:    . New Patient (Initial Visit)           HISTORY OF PRESENT ILLNESS:  Beth Peters is a 68 y.o. year old Caucasian female patient seen on 07/01/2019 from Briscoe Deutscher, DO , now Inda Coke, [place]   Chief concern according to patient : both parents and her son have been diagnosed with OSA. she has never had a SS.  She snores in her sleep, sleeps light and in reclined position due to GERD.  Her father had COPD.    I have the pleasure of seeing Beth Peters today, a right-handed White or Caucasian female with a possible sleep disorder.  She   has a past medical history of Anemia, Asthma, Chronic constipation, Cubital tunnel syndrome, bilateral, Family history of adverse reaction to anesthesia, Family history of breast cancer, Lactose intolerance, Lordosis of lumbar region (8/01), OA (osteoarthritis), Osteopenia, PONV (postoperative nausea and vomiting), Raynauds syndrome, Seasonal affective disorder ([place]), SUI (stress urinary incontinence, female), and Superficial thrombophlebitis.   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   She lives in a household with her spouse and dog.   There are adult children, a son in [place] and a daughter in [place].  No grandchildren yet.   Tobacco use; never - father was a heavy smoker and she had passive exposure.   ETOH use: socially - white wine, takes nexium- Caffeine intake in form of Coffee( 1 in AM ) Soda( none) Tea ( none) or energy drinks. Regular exercise in form of aquatics , before Covid she did some weight training.     Sleep habits are as follows: The patient's dinner time is  between 6-7 PM. The patient goes to bed at 10 PM and reads- she will fall asleep by 11 PM ,continues to sleep for several hours - she has to wake for 2 bathroom breaks, the first time at 3-4 AM.   The preferred sleep position is reclined with the support of 2 pillows. Dreams are reportedly frequently and /vividly.  She snores, she gasps.  7  AM is the usual rise time. The patient wakes up spontaneously at 4-5 AM, sometimes she gets more sleep, somtimes not. He/ She reports not feeling refreshed or restored in AM, with symptoms such as dry mouth, she drinks water all night,   and residual fatigue. Naps are taken infrequently.  Review of Systems: Out of a complete 14 system review, the patient complains of only the following symptoms, and all other reviewed systems are negative.:  Fatigue, loud snoring, gasping, choking. fragmented sleep, Insomnia - early awakening , often after GED or nocturia spell.    How likely are you to doze in the following situations: 0 = not likely, 1 = slight chance, 2 = moderate chance, 3 = high chance   Sitting and Reading? Watching Television? Sitting inactive in a public place (theater or meeting)? As a passenger in a car for an hour without a break? Lying down in the afternoon when circumstances permit? Sitting and talking to someone? Sitting quietly after lunch without  alcohol? In a car, while stopped for a few minutes in traffic?   Total = 10/ 24 points   FSS endorsed at 44/ 63 points.   Social History   Socioeconomic History  . Marital status: Married    Spouse name: Not on file  . Number of children: Not on file  . Years of education: Not on file  . Highest education level: Not on file  Occupational History  . Not on file  Tobacco Use  . Smoking status: Never Smoker  . Smokeless tobacco: Never Used  Substance and Sexual Activity  . Alcohol use: Yes    Alcohol/week: 8.0 standard drinks    Types: 8 Glasses of wine per week  . Drug use: No  .  Sexual activity: Yes    Partners: Male    Birth control/protection: Post-menopausal, Surgical    Comment: vasectomy, hysterectomy  Other Topics Concern  . Not on file  Social History Narrative  . Not on file   Social Determinants of Health   Financial Resource Strain:   . Difficulty of Paying Living Expenses: Not on file  Food Insecurity:   . Worried About Charity fundraiser in the Last Year: Not on file  . Ran Out of Food in the Last Year: Not on file  Transportation Needs:   . Lack of Transportation (Medical): Not on file  . Lack of Transportation (Non-Medical): Not on file  Physical Activity:   . Days of Exercise per Week: Not on file  . Minutes of Exercise per Session: Not on file  Stress:   . Feeling of Stress : Not on file  Social Connections:   . Frequency of Communication with Friends and Family: Not on file  . Frequency of Social Gatherings with Friends and Family: Not on file  . Attends Religious Services: Not on file  . Active Member of Clubs or Organizations: Not on file  . Attends Archivist Meetings: Not on file  . Marital Status: Not on file    Family History  Problem Relation Age of Onset  . Hypertension Father   . Heart disease Father   . Stroke Father   . Cancer Father        Testicular, Bladder, Prostate, Lung  . Heart disease Mother   . Stroke Mother   . Osteoporosis Mother   . Uterine cancer Sister 59  . Breast cancer Sister   . Cancer Sister        rectal, liver,lymph nodes  . Diabetes Maternal Grandfather   . Heart disease Maternal Grandfather   . Diabetes Paternal Grandmother   . Breast cancer Maternal Aunt     Past Medical History:  Diagnosis Date  . Anemia   . Asthma   . Chronic constipation   . Cubital tunnel syndrome, bilateral   . Family history of adverse reaction to anesthesia    sister and mother-- ponv  . Family history of breast cancer   . Lactose intolerance   . 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 OA  (osteoarthritis)   . Osteopenia   . PONV (postoperative nausea and vomiting)   . Raynauds syndrome   . Seasonal affective disorder ([place])   . SUI (stress urinary incontinence, female)   . Superficial thrombophlebitis    x2  left lower leg superficial in  2006  prior to vein stripping     Past Surgical History:  Procedure Laterality Date  . CYSTOSCOPY N/A 07/14/2018   Procedure: CYSTOSCOPY;  Surgeon: Salvadore Dom, MD;  Location: [facility];  Service: Gynecology;  Laterality: N/A;  . D & C HYSTEROSCOPY W/ RESECTION FIBROID  09-21-2002  dr Mohammed Kindle  [place] W/ RESECTION POLYP  12-24-2010  dr Joan Flores  [facility]  . DILATION AND CURETTAGE OF UTERUS    . ESOPHAGEAL DILATION  2013  --- [facility]   Omega dilation for Achalasia  . ESOPHAGOGASTRODUODENOSCOPY N/A 09/20/2017   Procedure: ESOPHAGOGASTRODUODENOSCOPY (EGD);  Surgeon: Clarene Essex, MD;  Location: [facility];  Service: Endoscopy;  Laterality: N/A;  . HELLER MYOTOMY  [id number]   fundoplication repair hiatal hernia  . KNEE ARTHROSCOPY Left 2006 &  09-27-2003  . KNEE ARTHROSCOPY W/ SYNOVECTOMY Right 09-20-2009  dr Wynelle Link  [facility]  . KNEE SURGERY Right 1962  . RECTOCELE REPAIR N/A 07/14/2018   Procedure: POSTERIOR REPAIR (RECTOCELE);  Surgeon: Salvadore Dom, MD;  Location: [facility];  Service: Gynecology;  Laterality: N/A;  . ROTATOR CUFF REPAIR Right 09/2011  . TONSILLECTOMY AND ADENOIDECTOMY  1961  . TOTAL KNEE ARTHROPLASTY Bilateral left 02-05-2006 ;  right 08-14-2007   dr Wynelle Link  [facility]  . TOTAL KNEE REVISION Right 11/26/2017   Procedure: Right knee polyethylene revision;  Surgeon: Gaynelle Arabian, MD;  Location: [facility];  Service: Orthopedics;  Laterality: Right;  Adductor Block  . VARICOSE VEIN SURGERY  09/2005     Current Outpatient Medications on File Prior to Visit  Medication Sig Dispense Refill  . ADVAIR DISKUS 250-50 MCG/DOSE AEPB TAKE 1 PUFF BY MOUTH TWICE A DAY (Patient taking  differently: Inhale 1 puff into the lungs 2 (two) times daily as needed (asthma). ) 180 each 1  . budesonide-formoterol (SYMBICORT) 160-4.5 MCG/ACT inhaler Inhale 2 puffs into the lungs 2 (two) times daily. 1 Inhaler 0  . buPROPion (WELLBUTRIN XL) 150 MG 24 hr tablet TAKE 1 TABLET BY MOUTH EVERY DAY 90 tablet 0  . CALCIUM PO Take 15 mLs by mouth daily.    . Cholecalciferol (VITAMIN D3) 1000 units CAPS Take 1,000 Units by mouth daily.    . diclofenac Sodium (VOLTAREN) 1 % GEL Voltaren 1 % topical gel    . docusate sodium (COLACE) 100 MG capsule Take 100 mg by mouth 2 (two) times daily as needed for mild constipation.    Marland Kitchen esomeprazole (NEXIUM) 40 MG capsule Take 40 mg by mouth See admin instructions. Take 40 mg by mouth daily. May take additional 40 mg if needed for acid reflux    . gabapentin (NEURONTIN) 100 MG capsule TAKE 1 CAPSULE BY MOUTH AT BEDTIME (WITH 300MG  CAPSULE) 90 capsule 3  . gabapentin (NEURONTIN) 300 MG capsule Take with the 100 mg tablet of gabapentin 90 capsule 3  . hydrochlorothiazide (HYDRODIURIL) 12.5 MG tablet TAKE 1 TABLET BY MOUTH EVERY DAY 90 tablet 1  . ibuprofen (ADVIL,MOTRIN) 200 MG tablet Take 200 mg by mouth every 6 (six) hours as needed.    . venlafaxine XR (EFFEXOR-XR) 37.5 MG 24 hr capsule TAKE 1 CAPSULE BY MOUTH EVERY DAY WITH BREAKFAST 90 capsule 3   No current facility-administered medications on file prior to visit.    Allergies  Allergen Reactions  . Dilaudid [Hydromorphone Hcl] Hives, Shortness Of Breath and Itching  . Glucagon Anaphylaxis  . Lactose Intolerance (Gi)   . Wasp Venom Itching and Swelling  . Wasp Venom Protein Itching and Hives    Physical exam:  Today's Vitals   07/01/19 1518  BP: (!) 162/99  Pulse: 71  Temp: (!) 97.1 F (36.2 C)  Weight: 164 lb (74.4 kg)  Height: 5' 2.5" (1.588 m)   Body mass index is 29.52 kg/m.   Wt Readings from Last 3 Encounters:  07/01/19 164 lb (74.4 kg)  06/24/19 160 lb (72.6 kg)  06/01/19 163 lb  4 oz (74 kg)     Ht Readings from Last 3 Encounters:  07/01/19 5' 2.5" (1.588 m)  06/24/19 5' 2.5" (1.588 m)  06/01/19 5' 2.5" (1.588 m)      General: The patient is awake, alert and appears not in acute distress. The patient is well groomed. Head: Normocephalic, atraumatic. Neck is supple.  Mallampati; 3 - small  Mouth, teeth are showing signs of bruxism.  neck circumference:14 inches . Nasal airflow patent.  Retrognathia is not seen.   Cardiovascular:  Regular rate and cardiac rhythm by pulse,  without distended neck veins. Respiratory: Lungs are clear to auscultation.  Skin:  With evidence of ankle edema, but not  rash. Trunk: The patient's posture is erect.   Neurologic exam : The patient is awake and alert, oriented to place and time.   Memory subjective described as intact.  Attention span & concentration ability appears normal.  Speech is fluent,  without  dysarthria, dysphonia or aphasia.  Mood and affect are appropriate.   Cranial nerves: no loss of smell or taste reported  Pupils are equal and briskly reactive to light. Funduscopic exam deferred..  Extraocular movements in vertical and horizontal planes were intact and without nystagmus. No Diplopia. Visual fields by finger perimetry are intact. Hearing was intact to soft voice and finger rubbing.    Facial sensation intact to fine touch.  Facial motor strength is symmetric and tongue and uvula move midline.  Neck ROM : rotation, tilt and flexion extension were normal for age and shoulder shrug was symmetrical.    Motor exam:  Symmetric bulk, tone and ROM.   Normal tone without cog wheeling, symmetric grip strength .   Sensory:  Fine touch, pinprick and vibration were tested  and  normal.  Proprioception tested in the upper extremities was normal.   Coordination: Rapid alternating movements in the fingers/hands were of normal speed.  The Finger-to-nose maneuver was intact without evidence of ataxia, dysmetria or  tremor.   Gait and station:  status post 2 knee replacements. Patient could rise unassisted from a seated position.  Deep tendon reflexes: in the  upper and lower extremities are symmetric and intact.  Babinski response was deferred .      After spending a total time of 40 minutes face to face and additional time for physical and neurologic examination, review of laboratory studies,  personal review of imaging studies, reports and results of other testing and review of referral information / records as far as provided in visit, I have established the following assessments:  1) Mrs. Alsteen presents with a history of achalasia, associated with an incompetent gastric sphincter, acid reflux and possibly also acid reflux related apnea.  She has been recently witnessed to snore she has actually woken herself up snoring.  There is one bathroom break most nights between 3 and 4 AM after which she sometimes cannot go back to sleep.  Her initial sleep latency also is longer than average almost an hour.  She reads in bed keeps her bedroom cool, quiet and dark.  She has set bedtimes and rise times.  She is not overdoing caffeine, she is not daily drinking alcohol.  My Plan is to proceed with:  1) an in lab study or HST could both be helpful to screen for apnea.    I would like to thank Inda Coke, [place],  [place] [id number] for allowing me to meet with and to take care of this pleasant patient.   In short, Karelyn Oas Werden is presenting with snoring and GERD related apnea.CC: I will share my notes with PCP. Marland Kitchen  Electronically signed by: Larey Seat, MD 07/01/2019 3:37 PM  [facility] and [facility] Sleep Board certified by The [employer] of Sleep Medicine and Diplomate of the [employer] of Sleep Medicine. Board certified In Neurology through the [employer], Fellow of the [employer] of Neurology. Medical Director of [employer].

## 2019-07-04 IMAGING — MG DIGITAL SCREENING BILATERAL MAMMOGRAM WITH TOMO AND CAD
8 series · 8 of 24 positions shown · non-contrast
Comparison: Previous exam(s).

CLINICAL DATA: Screening.

EXAM:
DIGITAL SCREENING BILATERAL MAMMOGRAM WITH TOMO AND CAD

[L CC synth-2D]
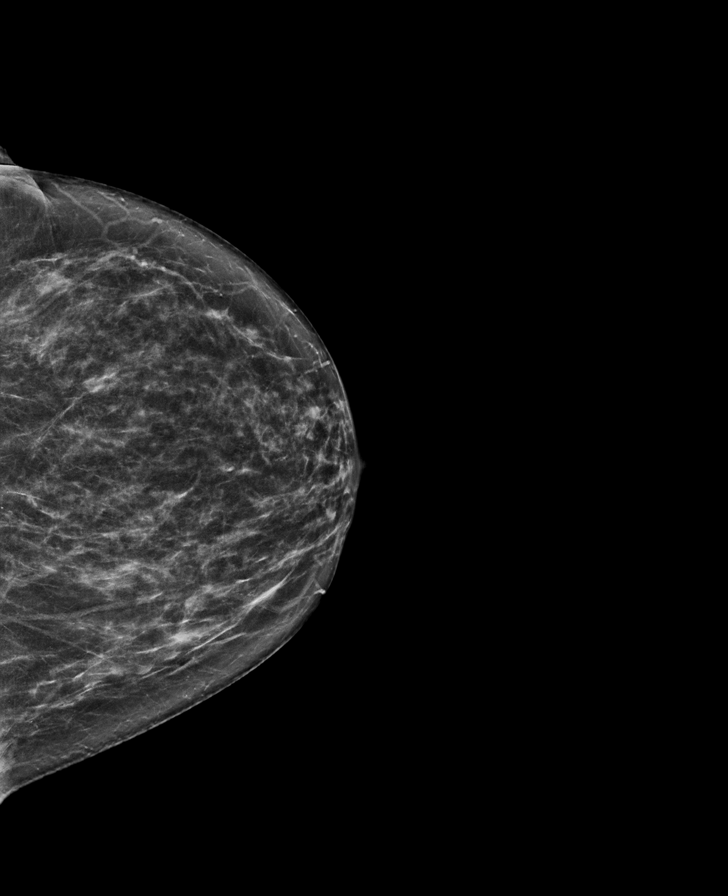

[R CC synth-2D]
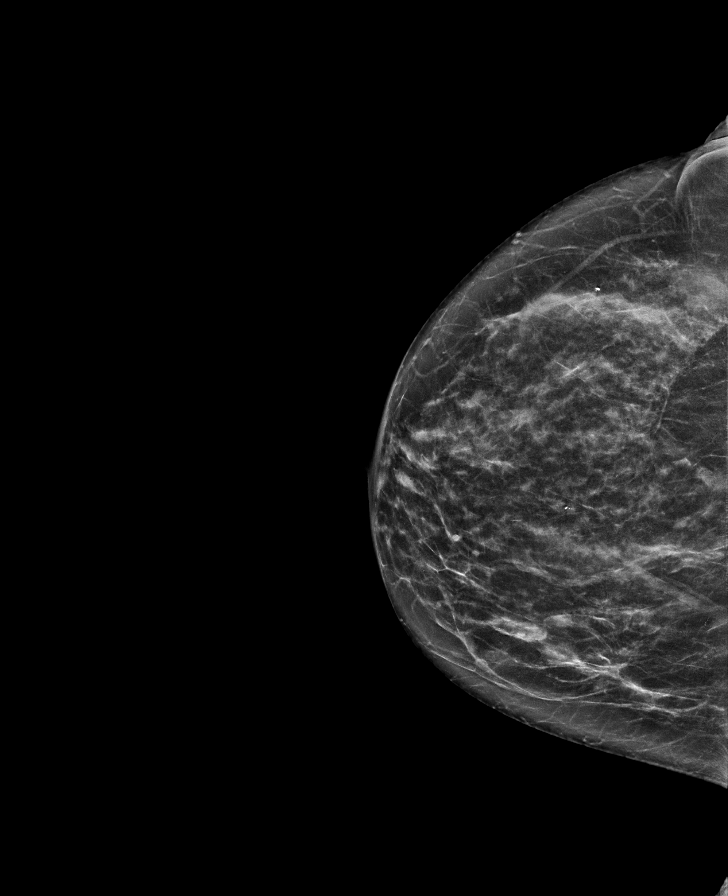

[L MLO synth-2D]
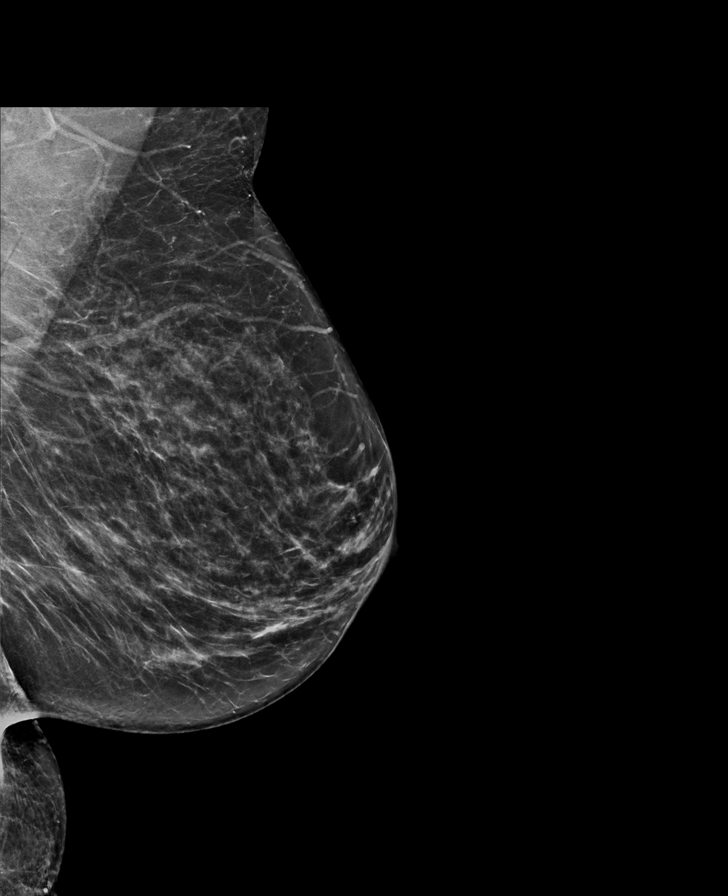

[R MLO synth-2D]
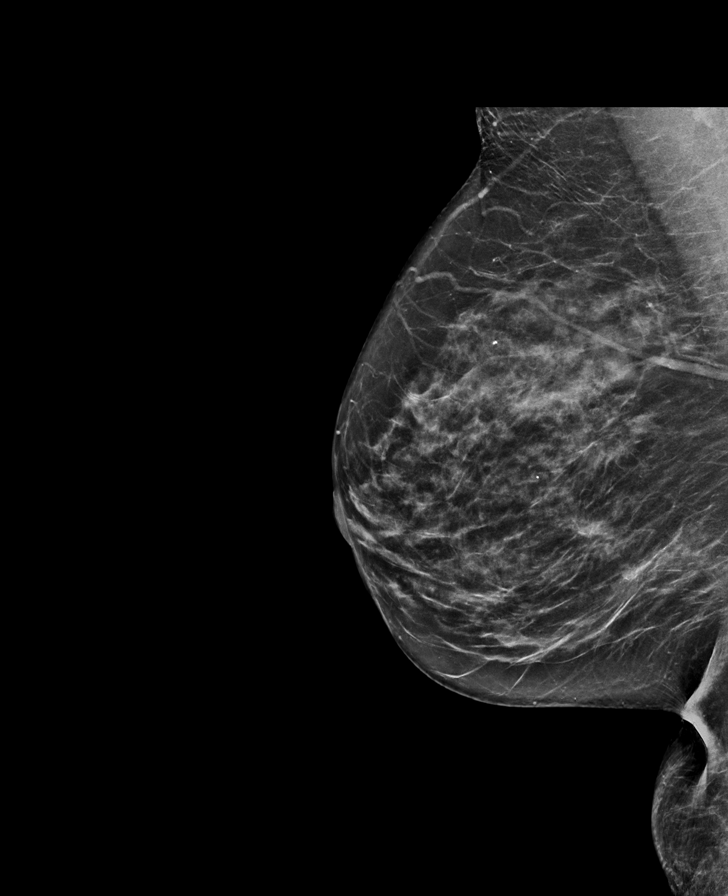

[L CC tomo · tomo slice 33/66.0]
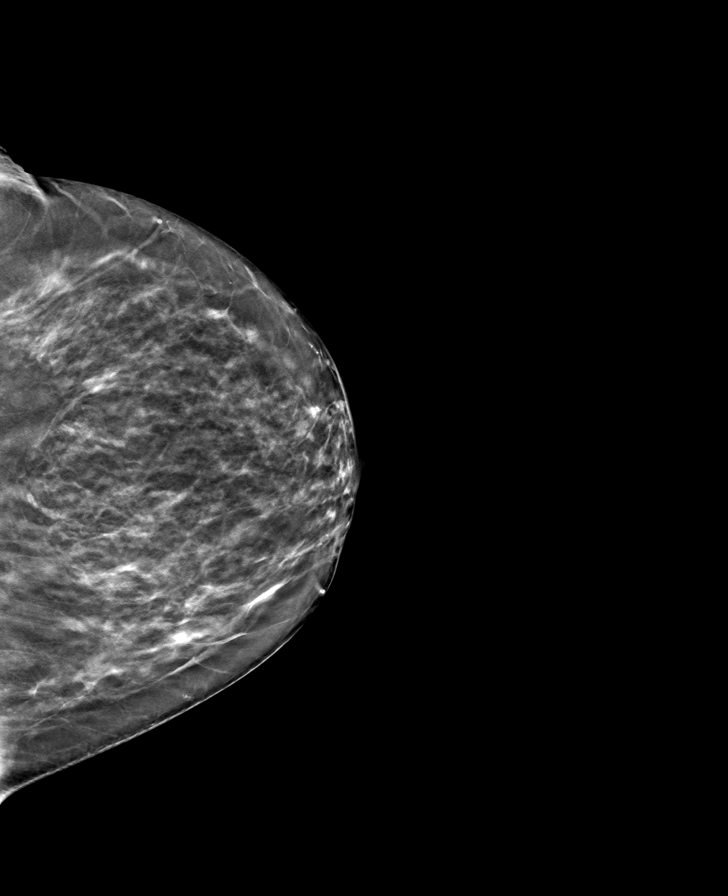

[L MLO tomo · tomo slice 35/69.0]
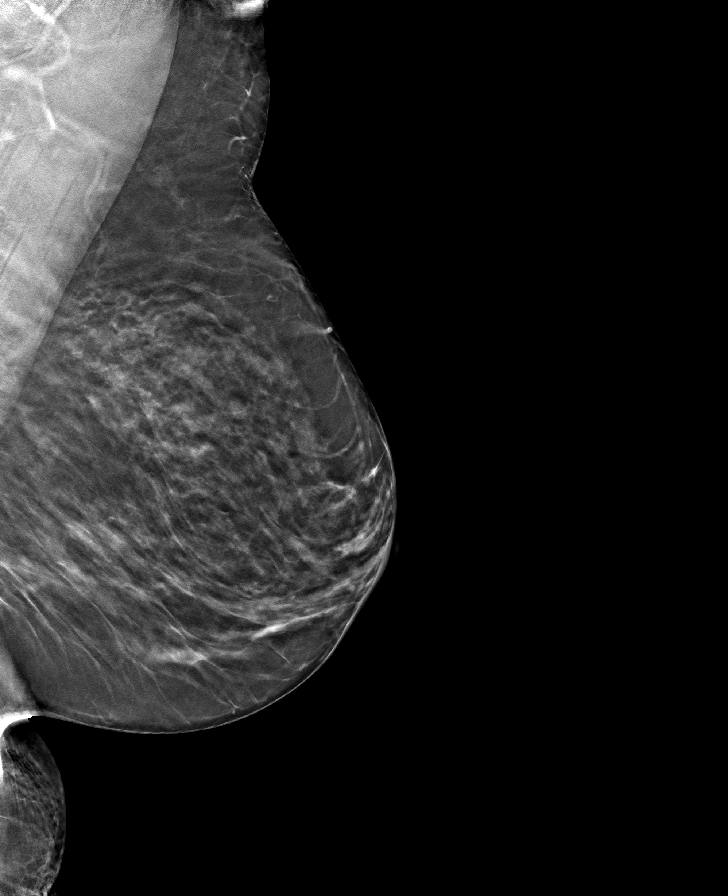

[R MLO tomo · tomo slice 35/70.0]
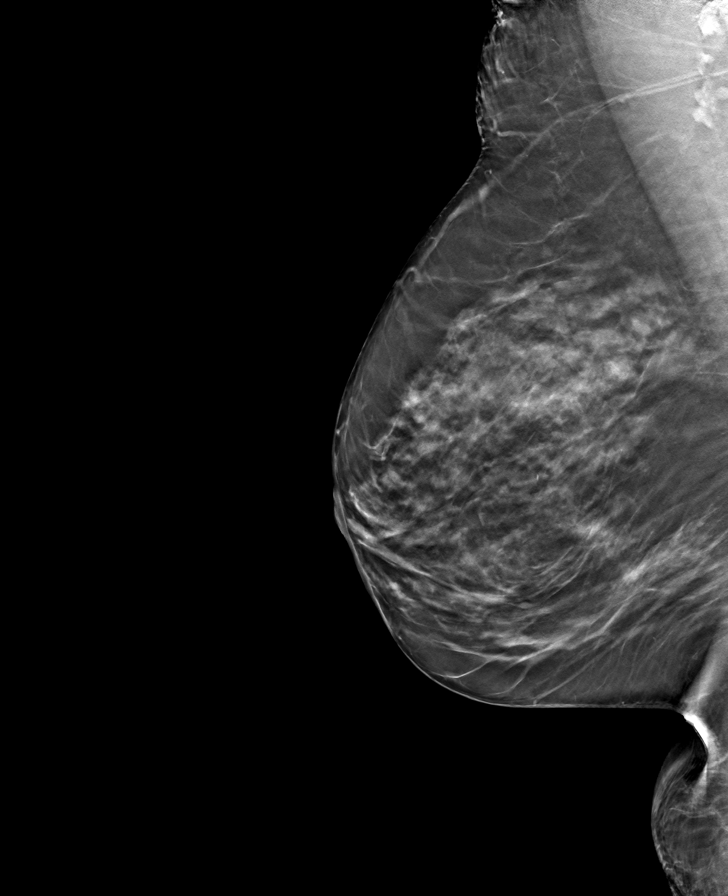

[R CC tomo · tomo slice 33/64.0]
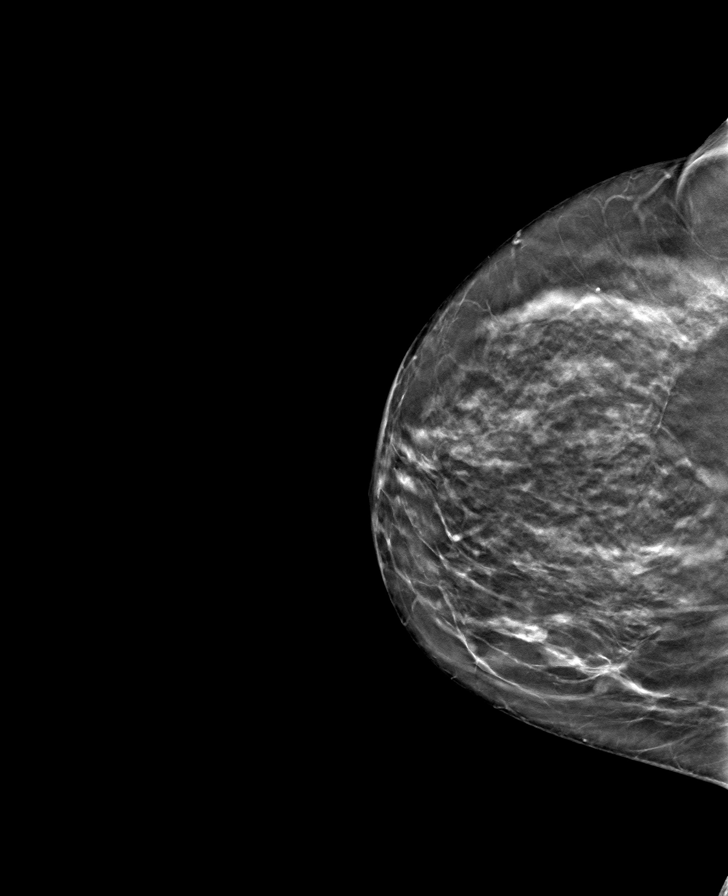

[8 of 24 positions shown; findings below may reference images not displayed]

ACR Breast Density Category c: The breast tissue is heterogeneously
dense, which may obscure small masses.
FINDINGS: There are no findings suspicious for malignancy. Images were
processed with CAD.
IMPRESSION: No mammographic evidence of malignancy. A result letter of this
screening mammogram will be mailed directly to the patient.

RECOMMENDATION:
Screening mammogram in one year. (Code:FT-U-LHB)

BI-RADS CATEGORY  1: Negative.

## 2019-07-21 DIAGNOSIS — M5412 Radiculopathy, cervical region: Secondary | ICD-10-CM | POA: Insufficient documentation

## 2019-07-21 DIAGNOSIS — M5030 Other cervical disc degeneration, unspecified cervical region: Secondary | ICD-10-CM | POA: Diagnosis not present

## 2019-07-21 DIAGNOSIS — M542 Cervicalgia: Secondary | ICD-10-CM | POA: Diagnosis not present

## 2019-07-28 ENCOUNTER — Ambulatory Visit: Payer: PPO

## 2019-07-30 DIAGNOSIS — M5412 Radiculopathy, cervical region: Secondary | ICD-10-CM | POA: Diagnosis not present

## 2019-08-03 ENCOUNTER — Other Ambulatory Visit: Payer: Self-pay

## 2019-08-03 ENCOUNTER — Ambulatory Visit (INDEPENDENT_AMBULATORY_CARE_PROVIDER_SITE_OTHER): Payer: PPO

## 2019-08-03 DIAGNOSIS — E538 Deficiency of other specified B group vitamins: Secondary | ICD-10-CM

## 2019-08-03 MED ORDER — CYANOCOBALAMIN 1000 MCG/ML IJ SOLN
1000.0000 ug | Freq: Once | INTRAMUSCULAR | Status: AC
  Administered 2019-08-03: 1000 ug via INTRAMUSCULAR

## 2019-08-03 NOTE — Progress Notes (Signed)
Per orders of Beth Peters, [place], injection of B12 given by Serita Sheller in right deltoid. Patient tolerated injection well. Patient will make appointment for 1 month.

## 2019-08-06 ENCOUNTER — Other Ambulatory Visit: Payer: Self-pay | Admitting: *Deleted

## 2019-08-06 DIAGNOSIS — M5030 Other cervical disc degeneration, unspecified cervical region: Secondary | ICD-10-CM | POA: Diagnosis not present

## 2019-08-06 MED ORDER — HYDROCHLOROTHIAZIDE 12.5 MG PO TABS: 12.5000 mg | ORAL_TABLET | Freq: Every day | ORAL | 1 refills | Status: DC

## 2019-08-09 ENCOUNTER — Ambulatory Visit (INDEPENDENT_AMBULATORY_CARE_PROVIDER_SITE_OTHER): Payer: PPO | Admitting: Neurology

## 2019-08-09 DIAGNOSIS — G4733 Obstructive sleep apnea (adult) (pediatric): Secondary | ICD-10-CM | POA: Diagnosis not present

## 2019-08-09 DIAGNOSIS — K2100 Gastro-esophageal reflux disease with esophagitis, without bleeding: Secondary | ICD-10-CM

## 2019-08-09 DIAGNOSIS — I10 Essential (primary) hypertension: Secondary | ICD-10-CM

## 2019-08-09 DIAGNOSIS — R0683 Snoring: Secondary | ICD-10-CM

## 2019-08-09 DIAGNOSIS — R0681 Apnea, not elsewhere classified: Secondary | ICD-10-CM

## 2019-08-09 DIAGNOSIS — K219 Gastro-esophageal reflux disease without esophagitis: Secondary | ICD-10-CM

## 2019-08-11 DIAGNOSIS — M25512 Pain in left shoulder: Secondary | ICD-10-CM | POA: Insufficient documentation

## 2019-08-11 DIAGNOSIS — M25511 Pain in right shoulder: Secondary | ICD-10-CM | POA: Diagnosis not present

## 2019-08-11 DIAGNOSIS — M5030 Other cervical disc degeneration, unspecified cervical region: Secondary | ICD-10-CM | POA: Diagnosis not present

## 2019-08-11 DIAGNOSIS — M542 Cervicalgia: Secondary | ICD-10-CM | POA: Diagnosis not present

## 2019-08-11 DIAGNOSIS — M5412 Radiculopathy, cervical region: Secondary | ICD-10-CM | POA: Diagnosis not present

## 2019-08-16 DIAGNOSIS — Z20828 Contact with and (suspected) exposure to other viral communicable diseases: Secondary | ICD-10-CM | POA: Diagnosis not present

## 2019-08-16 DIAGNOSIS — Z03818 Encounter for observation for suspected exposure to other biological agents ruled out: Secondary | ICD-10-CM | POA: Diagnosis not present

## 2019-08-17 DIAGNOSIS — K2100 Gastro-esophageal reflux disease with esophagitis, without bleeding: Secondary | ICD-10-CM | POA: Insufficient documentation

## 2019-08-17 DIAGNOSIS — R0683 Snoring: Secondary | ICD-10-CM | POA: Insufficient documentation

## 2019-08-17 NOTE — Progress Notes (Signed)
Hi Dr. Brett Fairy,  I think I was sent this by mistake and also listed as the referring provider by mistake. It doesn't look like I have seen her.  Thanks, Miquel Dunn

## 2019-08-17 NOTE — Progress Notes (Signed)
Summary & Diagnosis:     Mild obstructive Sleep apnea was noted at an AHI of 12. 2/h. and  only mildly accentuated by REM sleep. The patient's apnea pattern  was very much dependent on position, and she had significantly  higher AHIs when sleeping on her left ( AHI was 22.2) versus  right side (AHI was 5.4/h).  Recommendations:     Mild , non -REM Sleep dependent Apnea can be treated with a  dental device and also with avoiding the left lateral sleep  position. CPAP therapy would be optional if these steps do not  improve sleep quality.    Interpreting Physician: Larey Seat, MD

## 2019-08-17 NOTE — Procedures (Signed)
Patient Information     First Name: Beth Last Name: Peters ID: [id number]  Birth Date: Oct 28, 1950 Age: 69 Gender: Female  Referring Provider: Binnie Kand, NP BMI: 28.9 (W=163 lb, H=5' 3'')  Neck Circ.:  14 '' Epworth:  10/24   Sleep Study Information    Study Date: Aug 09, 2019 S/H/A Version: 001.001.001.001 / 4.1.1528 / 77  History:    [name] is a right-handed Caucasian female with a medical history of Anemia, Asthma, Chronic constipation, Cubital tunnel syndrome, bilateral, Family history of adverse reaction to anesthesia, Family history of breast cancer, Lactose intolerance, 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 She presents with achalasia, incompetent gastric sphincter and possibly acid reflux related sleep apnea. Snores loudly. Has a very long sleep latency. Evaluate for OSA.         Summary & Diagnosis:      Mild obstructive Sleep apnea was noted at an AHI of 12. 2/h. and only mildly accentuated by REM sleep. The patient's apnea pattern was very much dependent on position, and she had significantly higher AHIs when sleeping on her left ( AHI was  22.2) versus right side (AHI was 5.4/h). Recommendations:      Mild , non -REM Sleep dependent Apnea can be treated with a dental device and also with avoiding the left lateral sleep position. CPAP therapy would be optional if these steps do not improve sleep quality.    Interpreting Physician: Larey Seat, MD          Sleep Summary  Oxygen Saturation Statistics   Start Study Time: End Study Time: Total Recording Time:     11:35:12 PM 9:39:13 AM      10 h, 4 min  Total Sleep Time % REM of Sleep Time:  9 h, 13 min 20.1    Mean: 95 Minimum: 91 Maximum: 99  Mean of Desaturations Nadirs (%):   93  Oxygen  Desaturation. %:   4-9 10-20 >20 Total  Events Number Total    33  1 97.1 2.9  0 0.0  34 100.0  Oxygen Saturation: <90 <=88 <85 <80 <70  Duration (minutes): Sleep % 0.0 0.0  0.0 0.0  0.0 0.0 0.0 0.0 0.0 0.0     Respiratory Indices      Total Events REM NREM All Night  pRDI:  116  pAHI:  111 ODI:  34  pAHIc: 9  % CSR: 0.0 12.7 12.7 5.0 0.6 12.7 12.1 3.4 1.1 12.7 12.2 3.7 1.0       Pulse Rate Statistics during Sleep (BPM)      Mean: 70 Minimum: 59 Maximum: 94    Indices are calculated using technically valid sleep time of  9 h, 7 min. Central-Indices are calculated using technically valid sleep time of  9  h, 1 min. pRDI/pAHI are calculated using oxi desaturations ? 3%  Body Position Statistics  Position Supine Prone Right Left Non-Supine  Sleep (min) 0.0 40.5 305.0 208.0 553.5  Sleep % 0.0 7.3 55.1 37.6 100.0  pRDI N/A 19.4 5.4 22.2 12.7  pAHI N/A 14.9 5.4 21.6 12.2  ODI N/A 4.5 1.8 6.4 3.7     Snoring Statistics Snoring Level (dB) >40 >50 >60 >70 >80 >Threshold (45)  Sleep (min) 258.3 6.9 1.2 0.0 0.0 34.9  Sleep % 46.7 1.2 0.2 0.0 0.0 6.3    Mean:  41 dB Sleep Stages Chart

## 2019-08-18 ENCOUNTER — Telehealth: Payer: Self-pay | Admitting: Neurology

## 2019-08-18 DIAGNOSIS — M25511 Pain in right shoulder: Secondary | ICD-10-CM | POA: Diagnosis not present

## 2019-08-18 DIAGNOSIS — M25512 Pain in left shoulder: Secondary | ICD-10-CM | POA: Diagnosis not present

## 2019-08-18 DIAGNOSIS — M7541 Impingement syndrome of right shoulder: Secondary | ICD-10-CM | POA: Diagnosis not present

## 2019-08-18 DIAGNOSIS — M7542 Impingement syndrome of left shoulder: Secondary | ICD-10-CM | POA: Diagnosis not present

## 2019-08-18 NOTE — Telephone Encounter (Signed)
Called the patient and reviewed with her the sleep study in detail. Advised the patient that apnea was very minimal as the patient slept on the right side. Informed the patient on left side the apnea was increased. Advised Dr Dohmeier recommends to avoid sleeping on back or left side. See if she feels better after sleeping on the right side. Informed her if not she recommended CPAP as a option or dental device. Reviewed what they both look like. Informed her to call the office if she decides to move forward with either of those options. She verbalized understanding. Informed her in regards to CPAP as medicare patient she would have to start the process by June (within 6 mths) otherwise the process would have to be repeated over again. Pt verbalized understanding. Pt had no questions at this time but was encouraged to call back if questions arise.

## 2019-08-18 NOTE — Telephone Encounter (Signed)
-----   Message from Larey Seat, MD sent at 08/17/2019  8:56 AM EST ----- Summary & Diagnosis:     Mild obstructive Sleep apnea was noted at an AHI of 12. 2/h. and  only mildly accentuated by REM sleep. The patient's apnea pattern  was very much dependent on position, and she had significantly  higher AHIs when sleeping on her left ( AHI was 22.2) versus  right side (AHI was 5.4/h).  Recommendations:     Mild , non -REM Sleep dependent Apnea can be treated with a  dental device and also with avoiding the left lateral sleep  position. CPAP therapy would be optional if these steps do not  improve sleep quality.    Interpreting Physician: Larey Seat, MD

## 2019-08-19 ENCOUNTER — Other Ambulatory Visit: Payer: Self-pay | Admitting: Family Medicine

## 2019-08-24 DIAGNOSIS — M5030 Other cervical disc degeneration, unspecified cervical region: Secondary | ICD-10-CM | POA: Diagnosis not present

## 2019-08-31 ENCOUNTER — Ambulatory Visit (INDEPENDENT_AMBULATORY_CARE_PROVIDER_SITE_OTHER): Payer: PPO

## 2019-08-31 ENCOUNTER — Other Ambulatory Visit: Payer: Self-pay

## 2019-08-31 DIAGNOSIS — E538 Deficiency of other specified B group vitamins: Secondary | ICD-10-CM | POA: Diagnosis not present

## 2019-08-31 MED ORDER — CYANOCOBALAMIN 1000 MCG/ML IJ SOLN
1000.0000 ug | Freq: Once | INTRAMUSCULAR | Status: AC
  Administered 2019-08-31: 1000 ug via INTRAMUSCULAR

## 2019-08-31 NOTE — Progress Notes (Signed)
Per orders of Inda Coke, [place], injection of Vitamin B12 given by Tobe Sos in left deltoid. Patient tolerated injection well. Patient will make appointment for 1 month.

## 2019-09-07 ENCOUNTER — Telehealth: Payer: Self-pay | Admitting: Physician Assistant

## 2019-09-07 NOTE — Telephone Encounter (Signed)
I left a message asking the patient to call and schedule Medicare AWV with Courtney ([facility]).  If patient calls back, please schedule Medicare Wellness Visit (initial) at next available opening.  VDM (Dee-Dee) 

## 2019-09-10 DIAGNOSIS — M542 Cervicalgia: Secondary | ICD-10-CM | POA: Diagnosis not present

## 2019-09-10 DIAGNOSIS — M5030 Other cervical disc degeneration, unspecified cervical region: Secondary | ICD-10-CM | POA: Diagnosis not present

## 2019-09-13 DIAGNOSIS — M25511 Pain in right shoulder: Secondary | ICD-10-CM | POA: Diagnosis not present

## 2019-09-13 DIAGNOSIS — M25611 Stiffness of right shoulder, not elsewhere classified: Secondary | ICD-10-CM | POA: Diagnosis not present

## 2019-09-13 DIAGNOSIS — M25512 Pain in left shoulder: Secondary | ICD-10-CM | POA: Diagnosis not present

## 2019-09-13 DIAGNOSIS — M25612 Stiffness of left shoulder, not elsewhere classified: Secondary | ICD-10-CM | POA: Diagnosis not present

## 2019-09-17 DIAGNOSIS — M25512 Pain in left shoulder: Secondary | ICD-10-CM | POA: Diagnosis not present

## 2019-09-17 DIAGNOSIS — M25611 Stiffness of right shoulder, not elsewhere classified: Secondary | ICD-10-CM | POA: Diagnosis not present

## 2019-09-17 DIAGNOSIS — M25612 Stiffness of left shoulder, not elsewhere classified: Secondary | ICD-10-CM | POA: Diagnosis not present

## 2019-09-17 DIAGNOSIS — M25511 Pain in right shoulder: Secondary | ICD-10-CM | POA: Diagnosis not present

## 2019-09-20 DIAGNOSIS — M25512 Pain in left shoulder: Secondary | ICD-10-CM | POA: Diagnosis not present

## 2019-09-20 DIAGNOSIS — M25612 Stiffness of left shoulder, not elsewhere classified: Secondary | ICD-10-CM | POA: Diagnosis not present

## 2019-09-20 DIAGNOSIS — M25611 Stiffness of right shoulder, not elsewhere classified: Secondary | ICD-10-CM | POA: Diagnosis not present

## 2019-09-20 DIAGNOSIS — M25511 Pain in right shoulder: Secondary | ICD-10-CM | POA: Diagnosis not present

## 2019-09-21 ENCOUNTER — Telehealth: Payer: Self-pay

## 2019-09-21 NOTE — Telephone Encounter (Signed)
PA submitted via covermymeds for [employer] (Key: [id number])

## 2019-09-24 DIAGNOSIS — M25611 Stiffness of right shoulder, not elsewhere classified: Secondary | ICD-10-CM | POA: Diagnosis not present

## 2019-09-24 DIAGNOSIS — M25511 Pain in right shoulder: Secondary | ICD-10-CM | POA: Diagnosis not present

## 2019-09-24 DIAGNOSIS — M25512 Pain in left shoulder: Secondary | ICD-10-CM | POA: Diagnosis not present

## 2019-09-24 DIAGNOSIS — M25612 Stiffness of left shoulder, not elsewhere classified: Secondary | ICD-10-CM | POA: Diagnosis not present

## 2019-09-28 ENCOUNTER — Ambulatory Visit (INDEPENDENT_AMBULATORY_CARE_PROVIDER_SITE_OTHER): Payer: PPO

## 2019-09-28 ENCOUNTER — Other Ambulatory Visit: Payer: Self-pay

## 2019-09-28 DIAGNOSIS — M25611 Stiffness of right shoulder, not elsewhere classified: Secondary | ICD-10-CM | POA: Diagnosis not present

## 2019-09-28 DIAGNOSIS — M25612 Stiffness of left shoulder, not elsewhere classified: Secondary | ICD-10-CM | POA: Diagnosis not present

## 2019-09-28 DIAGNOSIS — M25512 Pain in left shoulder: Secondary | ICD-10-CM | POA: Diagnosis not present

## 2019-09-28 DIAGNOSIS — M25511 Pain in right shoulder: Secondary | ICD-10-CM | POA: Diagnosis not present

## 2019-09-28 DIAGNOSIS — E538 Deficiency of other specified B group vitamins: Secondary | ICD-10-CM | POA: Diagnosis not present

## 2019-09-28 MED ORDER — CYANOCOBALAMIN 1000 MCG/ML IJ SOLN
1000.0000 ug | Freq: Once | INTRAMUSCULAR | Status: AC
  Administered 2019-09-28: 1000 ug via INTRAMUSCULAR

## 2019-09-28 NOTE — Progress Notes (Addendum)
Per orders of [place],Samantha, injection of Vitamin B12  given by Loralyn Freshwater.Patient tolerated injection well.  I agree with the above assessment and plan.  Inda Coke PA-C

## 2019-10-08 DIAGNOSIS — M4802 Spinal stenosis, cervical region: Secondary | ICD-10-CM | POA: Diagnosis not present

## 2019-10-08 DIAGNOSIS — M47812 Spondylosis without myelopathy or radiculopathy, cervical region: Secondary | ICD-10-CM | POA: Diagnosis not present

## 2019-10-08 DIAGNOSIS — K220 Achalasia of cardia: Secondary | ICD-10-CM | POA: Diagnosis not present

## 2019-10-08 DIAGNOSIS — I10 Essential (primary) hypertension: Secondary | ICD-10-CM | POA: Diagnosis not present

## 2019-10-14 DIAGNOSIS — M47812 Spondylosis without myelopathy or radiculopathy, cervical region: Secondary | ICD-10-CM | POA: Diagnosis not present

## 2019-10-14 DIAGNOSIS — M542 Cervicalgia: Secondary | ICD-10-CM | POA: Diagnosis not present

## 2019-10-20 ENCOUNTER — Other Ambulatory Visit: Payer: Self-pay | Admitting: Family Medicine

## 2019-10-28 ENCOUNTER — Other Ambulatory Visit: Payer: Self-pay

## 2019-10-28 ENCOUNTER — Ambulatory Visit: Payer: PPO

## 2019-10-28 ENCOUNTER — Ambulatory Visit (INDEPENDENT_AMBULATORY_CARE_PROVIDER_SITE_OTHER): Payer: PPO

## 2019-10-28 VITALS — BP 132/68 | HR 76 | Temp 98.0°F | Ht 63.0 in | Wt 156.6 lb

## 2019-10-28 DIAGNOSIS — Z0000 Encounter for general adult medical examination without abnormal findings: Secondary | ICD-10-CM | POA: Diagnosis not present

## 2019-10-28 DIAGNOSIS — E538 Deficiency of other specified B group vitamins: Secondary | ICD-10-CM | POA: Diagnosis not present

## 2019-10-28 MED ORDER — CYANOCOBALAMIN 1000 MCG/ML IJ SOLN
1000.0000 ug | Freq: Once | INTRAMUSCULAR | Status: AC
  Administered 2019-10-28: 1000 ug via INTRAMUSCULAR

## 2019-10-28 NOTE — Progress Notes (Signed)
Subjective:   Beth Peters is a 69 y.o. female who presents for Medicare Annual (Subsequent) preventive examination.  Review of Systems:   Cardiac Risk Factors include: advanced age (>47men, >66 women);hypertension    Objective:     Vitals: BP 132/68   Pulse 76   Temp 98 F (36.7 C) (Temporal)   Ht 5\' 3"  (1.6 m)   Wt 156 lb 9.6 oz (71 kg)   LMP 09/23/2012   SpO2 96%   BMI 27.74 kg/m   Body mass index is 27.74 kg/m.  Advanced Directives 10/28/2019 07/14/2018 07/09/2018 11/26/2017 11/26/2017 11/19/2017 09/20/2017  Does Patient Have a Medical Advance Directive? Yes No No Yes Yes Yes No  Type of Advance Directive Living will;Healthcare Power of Attorney - - Living will Living will Living will -  Does patient want to make changes to medical advance directive? No - Patient declined - - No - Patient declined No - Patient declined - -  Copy of [place] in Chart? No - copy requested - - - - - -  Would patient like information on creating a medical advance directive? - No - Patient declined No - Patient declined - - - -    Tobacco Social History   Tobacco Use  Smoking Status Never Smoker  Smokeless Tobacco Never Used     Counseling given: Not Answered   Clinical Intake:  Pre-visit preparation completed: Yes  Diabetes: No  How often do you need to have someone help you when you read instructions, pamphlets, or other written materials from your doctor or pharmacy?: 1 - Never  Interpreter Needed?: No  Information entered by :: Denman George LPN  Past Medical History:  Diagnosis Date  . Anemia   . Asthma   . Chronic constipation   . Cubital tunnel syndrome, bilateral   . Family history of adverse reaction to anesthesia    sister and mother-- ponv  . Family history of breast cancer   . Lactose intolerance   . 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 OA (osteoarthritis)   . Osteopenia   . PONV (postoperative nausea and vomiting)   . Raynauds  syndrome   . Seasonal affective disorder ([place])   . SUI (stress urinary incontinence, female)   . Superficial thrombophlebitis    x2  left lower leg superficial in  2006  prior to vein stripping    Past Surgical History:  Procedure Laterality Date  . CYSTOSCOPY N/A 07/14/2018   Procedure: CYSTOSCOPY;  Surgeon: Salvadore Dom, MD;  Location: [facility];  Service: Gynecology;  Laterality: N/A;  . D & C HYSTEROSCOPY W/ RESECTION FIBROID  09-21-2002  dr Mohammed Kindle  [place] W/ RESECTION POLYP  12-24-2010  dr Joan Flores  [facility]  . DILATION AND CURETTAGE OF UTERUS    . ESOPHAGEAL DILATION  2013  --- [facility]   Omega dilation for Achalasia  . ESOPHAGOGASTRODUODENOSCOPY N/A 09/20/2017   Procedure: ESOPHAGOGASTRODUODENOSCOPY (EGD);  Surgeon: Clarene Essex, MD;  Location: [facility];  Service: Endoscopy;  Laterality: N/A;  . HELLER MYOTOMY  123456   fundoplication repair hiatal hernia  . KNEE ARTHROSCOPY Left 2006 &  09-27-2003  . KNEE ARTHROSCOPY W/ SYNOVECTOMY Right 09-20-2009  dr Wynelle Link  [facility]  . KNEE SURGERY Right 1962  . RECTOCELE REPAIR N/A 07/14/2018   Procedure: POSTERIOR REPAIR (RECTOCELE);  Surgeon: Salvadore Dom, MD;  Location: [facility];  Service: Gynecology;  Laterality:  N/A;  . ROTATOR CUFF REPAIR Right 09/2011  . TONSILLECTOMY AND ADENOIDECTOMY  1961  . TOTAL KNEE ARTHROPLASTY Bilateral left 02-05-2006 ;  right 08-14-2007   dr Wynelle Link  [facility]  . TOTAL KNEE REVISION Right 11/26/2017   Procedure: Right knee polyethylene revision;  Surgeon: Gaynelle Arabian, MD;  Location: [facility];  Service: Orthopedics;  Laterality: Right;  Adductor Block  . VARICOSE VEIN SURGERY  09/2005   Family History  Problem Relation Age of Onset  . Hypertension Father   . Heart disease Father   . Stroke Father   . Cancer Father        Testicular, Bladder, Prostate, Lung  . Heart disease Mother   . Stroke Mother   . Osteoporosis Mother   . Uterine cancer Sister 37   . Breast cancer Sister   . Cancer Sister        rectal, liver,lymph nodes  . Diabetes Maternal Grandfather   . Heart disease Maternal Grandfather   . Diabetes Paternal Grandmother   . Breast cancer Maternal Aunt    Social History   Socioeconomic History  . Marital status: Married    Spouse name: Not on file  . Number of children: 2  . Years of education: Not on file  . Highest education level: Not on file  Occupational History  . Occupation: Retired     Comment: Forensic scientist  . Smoking status: Never Smoker  . Smokeless tobacco: Never Used  Substance and Sexual Activity  . Alcohol use: Yes    Alcohol/week: 8.0 standard drinks    Types: 8 Glasses of wine per week  . Drug use: No  . Sexual activity: Yes    Partners: Male    Birth control/protection: Post-menopausal, Surgical    Comment: vasectomy, hysterectomy  Other Topics Concern  . Not on file  Social History Narrative   Daughter getting married Spring 2021; 1st grandchild due in June       Hobbies: Oil painting and antiquing    Social Determinants of Health   Financial Resource Strain:   . Difficulty of Paying Living Expenses:   Food Insecurity:   . Worried About Charity fundraiser in the Last Year:   . Arboriculturist in the Last Year:   Transportation Needs:   . Film/video editor (Medical):   Marland Kitchen Lack of Transportation (Non-Medical):   Physical Activity:   . Days of Exercise per Week:   . Minutes of Exercise per Session:   Stress:   . Feeling of Stress :   Social Connections:   . Frequency of Communication with Friends and Family:   . Frequency of Social Gatherings with Friends and Family:   . Attends Religious Services:   . Active Member of Clubs or Organizations:   . Attends Archivist Meetings:   Marland Kitchen Marital Status:     Outpatient Encounter Medications as of 10/28/2019  Medication Sig  . ADVAIR DISKUS 250-50 MCG/DOSE AEPB TAKE 1 PUFF BY MOUTH TWICE A DAY (Patient  taking differently: Inhale 1 puff into the lungs 2 (two) times daily as needed (asthma). )  . azelastine (ASTELIN) 0.1 % nasal spray azelastine 137 mcg (0.1 %) nasal spray aerosol  PLACE 2 SPRAYS INTO BOTH NOSTRILS AT BEDTIME. USE IN EACH NOSTRIL AS DIRECTED  . budesonide-formoterol (SYMBICORT) 160-4.5 MCG/ACT inhaler Inhale 2 puffs into the lungs 2 (two) times daily.  Marland Kitchen buPROPion (WELLBUTRIN XL) 150 MG 24 hr tablet TAKE  1 TABLET BY MOUTH EVERY DAY  . CALCIUM PO Take 15 mLs by mouth daily.  . Cholecalciferol (VITAMIN D3) 1000 units CAPS Take 1,000 Units by mouth daily.  . diclofenac Sodium (VOLTAREN) 1 % GEL Voltaren 1 % topical gel  . docusate sodium (COLACE) 100 MG capsule Take 100 mg by mouth 2 (two) times daily as needed for mild constipation.  Marland Kitchen esomeprazole (NEXIUM) 40 MG capsule Take 40 mg by mouth See admin instructions. Take 40 mg by mouth daily. May take additional 40 mg if needed for acid reflux  . gabapentin (NEURONTIN) 100 MG capsule TAKE 1 CAPSULE BY MOUTH AT BEDTIME (WITH 300MG  CAPSULE)  . gabapentin (NEURONTIN) 300 MG capsule Take with the 100 mg tablet of gabapentin  . hydrochlorothiazide (HYDRODIURIL) 12.5 MG tablet Take 1 tablet (12.5 mg total) by mouth daily.  Marland Kitchen ibuprofen (ADVIL,MOTRIN) 200 MG tablet Take 200 mg by mouth every 6 (six) hours as needed.  . montelukast (SINGULAIR) 10 MG tablet montelukast 10 mg tablet  TAKE 1 TABLET BY MOUTH EVERYDAY AT BEDTIME  . progesterone (PROMETRIUM) 100 MG capsule progesterone micronized 100 mg capsule  . traMADol (ULTRAM) 50 MG tablet Take 50 mg by mouth 3 (three) times daily as needed.  . venlafaxine XR (EFFEXOR-XR) 37.5 MG 24 hr capsule TAKE 1 CAPSULE BY MOUTH EVERY DAY WITH BREAKFAST   No facility-administered encounter medications on file as of 10/28/2019.    Activities of Daily Living In your present state of health, do you have any difficulty performing the following activities: 10/28/2019  Hearing? N  Vision? N  Difficulty  concentrating or making decisions? N  Walking or climbing stairs? N  Dressing or bathing? N  Doing errands, shopping? N  Preparing Food and eating ? N  Comment some problems with swallowing due to achalasia  Using the Toilet? N  In the past six months, have you accidently leaked urine? N  Do you have problems with loss of bowel control? N  Managing your Medications? N  Managing your Finances? N  Housekeeping or managing your Housekeeping? N  Some recent data might be hidden    Patient Care Team: Inda Coke, [place] as PCP - General (Physician Assistant) Gaynelle Arabian, MD as Consulting Physician (Orthopedic Surgery) Levy Pupa, PA-C as Physician Assistant (Chiropractic Medicine) Salvadore Dom, MD as Consulting Physician (Obstetrics and Gynecology) Ronald Lobo, MD as Consulting Physician (Gastroenterology) Suella Broad, MD as Consulting Physician (Physical Medicine and Rehabilitation) Melina Schools, MD as Consulting Physician (Orthopedic Surgery) Kristeen Miss, MD as Consulting Physician (Neurosurgery) Izora Gala, MD as Consulting Physician (Otolaryngology) Dohmeier, Asencion Partridge, MD as Consulting Physician (Neurology)    Assessment:   This is a routine wellness examination for Chae.  Exercise Activities and Dietary recommendations Current Exercise Habits: Structured exercise class, Type of exercise: Other - see comments(water exercise), Time (Minutes): 45, Frequency (Times/Week): 3, Weekly Exercise (Minutes/Week): 135  Goals   None     Fall Risk Fall Risk  10/28/2019 06/01/2019 06/02/2017  Falls in the past year? 0 1 No  Number falls in past yr: 0 1 -  Injury with Fall? 0 0 -  Risk for fall due to : Impaired mobility Impaired balance/gait;Other (Comment) -  Risk for fall due to: Comment - Dogs tripped her -  Follow up Falls evaluation completed;Education provided;Falls prevention discussed Falls evaluation completed -   Is the patient's home free of  loose throw rugs in walkways, pet beds, electrical cords, etc?   yes  Grab bars in the bathroom? yes      Handrails on the stairs?   yes      Adequate lighting?   yes  Timed Get Up and Go performed: completed and within normal timeframe; no gait abnormalities noted   Depression Screen PHQ 2/9 Scores 10/28/2019 06/01/2019 06/02/2017  PHQ - 2 Score 2 2 0  PHQ- 9 Score 4 14 -     Cognitive Function     6CIT Screen 10/28/2019  What Year? 0 points  What month? 0 points  What time? 0 points  Count back from 20 0 points  Months in reverse 0 points  Repeat phrase 0 points  Total Score 0    Immunization History  Administered Date(s) Administered  . Fluad Quad(high Dose 65+) 05/14/2019  . Influenza, High Dose Seasonal PF 07/11/2011, 05/06/2018  . Influenza,inj,Quad PF,6+ Mos 05/13/2017  . Influenza,inj,quad, With Preservative 04/14/2017, 04/14/2018  . Pneumococcal Conjugate-13 05/06/2018  . Pneumococcal Polysaccharide-23 07/15/2005  . Td 07/15/2005  . Zoster 09/30/2012    Qualifies for Shingles Vaccine?Discussed and patient will check with pharmacy for coverage.  Patient education handout provided   Screening Tests Health Maintenance  Topic Date Due  . PNA vac Low Risk Adult (2 of 2 - PPSV23) 05/07/2019  . TETANUS/TDAP  05/31/2020 (Originally 07/16/2015)  . INFLUENZA VACCINE  02/13/2020  . MAMMOGRAM  08/10/2020  . COLONOSCOPY  01/21/2029  . DEXA SCAN  Completed  . Hepatitis C Screening  Completed    Cancer Screenings: Lung: Low Dose CT Chest recommended if Age 69-80 years, 30 pack-year currently smoking OR have quit w/in 15years. Patient does not qualify. Breast:  Up to date on Mammogram? Yes   Up to date of Bone Density/Dexa? Yes Colorectal: colonoscopy 01/22/19     Plan:  I have personally reviewed and addressed the Medicare Annual Wellness questionnaire and have noted the following in the patient's chart:  A. Medical and social history B. Use of alcohol, tobacco  or illicit drugs  C. Current medications and supplements D. Functional ability and status E.  Nutritional status F.  Physical activity G. Advance directives H. List of other physicians I.  Hospitalizations, surgeries, and ER visits in previous 12 months J.  [place] such as hearing and vision if needed, cognitive and depression L. Referrals, records requested, and appointments- none   In addition, I have reviewed and discussed with patient certain preventive protocols, quality metrics, and best practice recommendations. A written personalized care plan for preventive services as well as general preventive health recommendations were provided to patient.   Signed,  Denman George, LPN  Nurse Health Advisor   Nurse Notes: Patient has completed Covid vaccine Therapist, music). Wanted to make provider aware that she continues to have problems with sore throat.  She plans to consult with ENT and GI.   B12 injection given as ordered.  No voiced complaints.  Patient to return as ordered for next injection.

## 2019-10-28 NOTE — Patient Instructions (Addendum)
Beth Peters , Thank you for taking time to come for your Medicare Wellness Visit. I appreciate your ongoing commitment to your health goals. Please review the following plan we discussed and let me know if I can assist you in the future.   Screening recommendations/referrals: Colorectal Screening: up to date; last colonoscopy 01/22/19 Mammogram: up to date; last 08/10/18 Bone Density: up to date; last 04/06/19  Vision and Dental Exams: Recommended annual ophthalmology exams for early detection of glaucoma and other disorders of the eye Recommended annual dental exams for proper oral hygiene  Vaccinations: Influenza vaccine: completed 05/14/19 Pneumococcal vaccine: up to date; last 05/06/18 Tdap vaccine: recommended every 10 years; Please call your insurance company to determine your out of pocket expense. You also receive this vaccine at your local pharmacy or Health Dept. Shingles vaccine: You may receive this vaccine at your local pharmacy. (see handout)  Covid vaccine:  Completed   Advanced directives: Please bring a copy of your POA (Power of Attorney) and/or Living Will to your next appointment.  Goals: Recommend to drink at least 6-8 8oz glasses of water per day and consume a balanced diet rich in fresh fruits and vegetables.   Next appointment: Please schedule your Annual Wellness Visit with your Nurse Health Advisor in one year.  Preventive Care 68 Years and Older, Female Preventive care refers to lifestyle choices and visits with your health care provider that can promote health and wellness. What does preventive care include?  A yearly physical exam. This is also called an annual well check.  Dental exams once or twice a year.  Routine eye exams. Ask your health care provider how often you should have your eyes checked.  Personal lifestyle choices, including:  Daily care of your teeth and gums.  Regular physical activity.  Eating a healthy diet.  Avoiding tobacco  and drug use.  Limiting alcohol use.  Practicing safe sex.  Taking low-dose aspirin every day if recommended by your health care provider.  Taking vitamin and mineral supplements as recommended by your health care provider. What happens during an annual well check? The services and screenings done by your health care provider during your annual well check will depend on your age, overall health, lifestyle risk factors, and family history of disease. Counseling  Your health care provider may ask you questions about your:  Alcohol use.  Tobacco use.  Drug use.  Emotional well-being.  Home and relationship well-being.  Sexual activity.  Eating habits.  History of falls.  Memory and ability to understand (cognition).  Work and work Statistician.  Reproductive health. Screening  You may have the following tests or measurements:  Height, weight, and BMI.  Blood pressure.  Lipid and cholesterol levels. These may be checked every 5 years, or more frequently if you are over 77 years old.  Skin check.  Lung cancer screening. You may have this screening every year starting at age 34 if you have a 30-pack-year history of smoking and currently smoke or have quit within the past 15 years.  Fecal occult blood test (FOBT) of the stool. You may have this test every year starting at age 50.  Flexible sigmoidoscopy or colonoscopy. You may have a sigmoidoscopy every 5 years or a colonoscopy every 10 years starting at age 58.  Hepatitis C blood test.  Hepatitis B blood test.  Sexually transmitted disease (STD) testing.  Diabetes screening. This is done by checking your blood sugar (glucose) after you have not eaten for a  while (fasting). You may have this done every 1-3 years.  Bone density scan. This is done to screen for osteoporosis. You may have this done starting at age 30.  Mammogram. This may be done every 1-2 years. Talk to your health care provider about how often you  should have regular mammograms. Talk with your health care provider about your test results, treatment options, and if necessary, the need for more tests. Vaccines  Your health care provider may recommend certain vaccines, such as:  Influenza vaccine. This is recommended every year.  Tetanus, diphtheria, and acellular pertussis (Tdap, Td) vaccine. You may need a Td booster every 10 years.  Zoster vaccine. You may need this after age 8.  Pneumococcal 13-valent conjugate (PCV13) vaccine. One dose is recommended after age 73.  Pneumococcal polysaccharide (PPSV23) vaccine. One dose is recommended after age 65. Talk to your health care provider about which screenings and vaccines you need and how often you need them. This information is not intended to replace advice given to you by your health care provider. Make sure you discuss any questions you have with your health care provider. Document Released: 07/28/2015 Document Revised: 03/20/2016 Document Reviewed: 05/02/2015 Elsevier Interactive Patient Education  2017 [place] Prevention in the Home Falls can cause injuries. They can happen to people of all ages. There are many things you can do to make your home safe and to help prevent falls. What can I do on the outside of my home?  Regularly fix the edges of walkways and driveways and fix any cracks.  Remove anything that might make you trip as you walk through a door, such as a raised step or threshold.  Trim any bushes or trees on the path to your home.  Use bright outdoor lighting.  Clear any walking paths of anything that might make someone trip, such as rocks or tools.  Regularly check to see if handrails are loose or broken. Make sure that both sides of any steps have handrails.  Any raised decks and porches should have guardrails on the edges.  Have any leaves, snow, or ice cleared regularly.  Use sand or salt on walking paths during winter.  Clean up any  spills in your garage right away. This includes oil or grease spills. What can I do in the bathroom?  Use night lights.  Install grab bars by the toilet and in the tub and shower. Do not use towel bars as grab bars.  Use non-skid mats or decals in the tub or shower.  If you need to sit down in the shower, use a plastic, non-slip stool.  Keep the floor dry. Clean up any water that spills on the floor as soon as it happens.  Remove soap buildup in the tub or shower regularly.  Attach bath mats securely with double-sided non-slip rug tape.  Do not have throw rugs and other things on the floor that can make you trip. What can I do in the bedroom?  Use night lights.  Make sure that you have a light by your bed that is easy to reach.  Do not use any sheets or blankets that are too big for your bed. They should not hang down onto the floor.  Have a firm chair that has side arms. You can use this for support while you get dressed.  Do not have throw rugs and other things on the floor that can make you trip. What can I do in the  kitchen?  Clean up any spills right away.  Avoid walking on wet floors.  Keep items that you use a lot in easy-to-reach places.  If you need to reach something above you, use a strong step stool that has a grab bar.  Keep electrical cords out of the way.  Do not use floor polish or wax that makes floors slippery. If you must use wax, use non-skid floor wax.  Do not have throw rugs and other things on the floor that can make you trip. What can I do with my stairs?  Do not leave any items on the stairs.  Make sure that there are handrails on both sides of the stairs and use them. Fix handrails that are broken or loose. Make sure that handrails are as long as the stairways.  Check any carpeting to make sure that it is firmly attached to the stairs. Fix any carpet that is loose or worn.  Avoid having throw rugs at the top or bottom of the stairs. If you  do have throw rugs, attach them to the floor with carpet tape.  Make sure that you have a light switch at the top of the stairs and the bottom of the stairs. If you do not have them, ask someone to add them for you. What else can I do to help prevent falls?  Wear shoes that:  Do not have high heels.  Have rubber bottoms.  Are comfortable and fit you well.  Are closed at the toe. Do not wear sandals.  If you use a stepladder:  Make sure that it is fully opened. Do not climb a closed stepladder.  Make sure that both sides of the stepladder are locked into place.  Ask someone to hold it for you, if possible.  Clearly mark and make sure that you can see:  Any grab bars or handrails.  First and last steps.  Where the edge of each step is.  Use tools that help you move around (mobility aids) if they are needed. These include:  Canes.  Walkers.  Scooters.  Crutches.  Turn on the lights when you go into a dark area. Replace any light bulbs as soon as they burn out.  Set up your furniture so you have a clear path. Avoid moving your furniture around.  If any of your floors are uneven, fix them.  If there are any pets around you, be aware of where they are.  Review your medicines with your doctor. Some medicines can make you feel dizzy. This can increase your chance of falling. Ask your doctor what other things that you can do to help prevent falls. This information is not intended to replace advice given to you by your health care provider. Make sure you discuss any questions you have with your health care provider. Document Released: 04/27/2009 Document Revised: 12/07/2015 Document Reviewed: 08/05/2014 Elsevier Interactive Patient Education  2017 [employer].

## 2019-10-29 DIAGNOSIS — M25511 Pain in right shoulder: Secondary | ICD-10-CM | POA: Diagnosis not present

## 2019-10-29 DIAGNOSIS — M25611 Stiffness of right shoulder, not elsewhere classified: Secondary | ICD-10-CM | POA: Diagnosis not present

## 2019-10-29 DIAGNOSIS — M25512 Pain in left shoulder: Secondary | ICD-10-CM | POA: Diagnosis not present

## 2019-10-29 DIAGNOSIS — M25612 Stiffness of left shoulder, not elsewhere classified: Secondary | ICD-10-CM | POA: Diagnosis not present

## 2019-11-01 DIAGNOSIS — M25611 Stiffness of right shoulder, not elsewhere classified: Secondary | ICD-10-CM | POA: Diagnosis not present

## 2019-11-01 DIAGNOSIS — M25512 Pain in left shoulder: Secondary | ICD-10-CM | POA: Diagnosis not present

## 2019-11-01 DIAGNOSIS — M25511 Pain in right shoulder: Secondary | ICD-10-CM | POA: Diagnosis not present

## 2019-11-01 DIAGNOSIS — M25612 Stiffness of left shoulder, not elsewhere classified: Secondary | ICD-10-CM | POA: Diagnosis not present

## 2019-12-17 ENCOUNTER — Other Ambulatory Visit: Payer: Self-pay

## 2019-12-17 ENCOUNTER — Emergency Department (HOSPITAL_COMMUNITY)
Admission: EM | Admit: 2019-12-17 | Discharge: 2019-12-17 | Disposition: A | Discharge status: Home / Self Care | Payer: PPO | Attending: Emergency Medicine | Admitting: Emergency Medicine

## 2019-12-17 ENCOUNTER — Encounter (HOSPITAL_COMMUNITY): Payer: Self-pay

## 2019-12-17 ENCOUNTER — Emergency Department (HOSPITAL_COMMUNITY): Payer: PPO

## 2019-12-17 DIAGNOSIS — M542 Cervicalgia: Secondary | ICD-10-CM

## 2019-12-17 DIAGNOSIS — Y9389 Activity, other specified: Secondary | ICD-10-CM | POA: Diagnosis not present

## 2019-12-17 DIAGNOSIS — J45909 Unspecified asthma, uncomplicated: Secondary | ICD-10-CM | POA: Insufficient documentation

## 2019-12-17 DIAGNOSIS — Z79899 Other long term (current) drug therapy: Secondary | ICD-10-CM | POA: Insufficient documentation

## 2019-12-17 DIAGNOSIS — Z885 Allergy status to narcotic agent status: Secondary | ICD-10-CM | POA: Insufficient documentation

## 2019-12-17 DIAGNOSIS — I10 Essential (primary) hypertension: Secondary | ICD-10-CM | POA: Diagnosis not present

## 2019-12-17 DIAGNOSIS — R52 Pain, unspecified: Secondary | ICD-10-CM | POA: Diagnosis not present

## 2019-12-17 DIAGNOSIS — Y998 Other external cause status: Secondary | ICD-10-CM | POA: Diagnosis not present

## 2019-12-17 DIAGNOSIS — Y92410 Unspecified street and highway as the place of occurrence of the external cause: Secondary | ICD-10-CM | POA: Insufficient documentation

## 2019-12-17 DIAGNOSIS — R202 Paresthesia of skin: Secondary | ICD-10-CM | POA: Diagnosis not present

## 2019-12-17 DIAGNOSIS — Z888 Allergy status to other drugs, medicaments and biological substances status: Secondary | ICD-10-CM | POA: Insufficient documentation

## 2019-12-17 MED ORDER — METHOCARBAMOL 500 MG PO TABS: 500.0000 mg | ORAL_TABLET | Freq: Two times a day (BID) | ORAL | 0 refills | Status: AC

## 2019-12-17 MED ORDER — METHOCARBAMOL 500 MG PO TABS
750.0000 mg | ORAL_TABLET | Freq: Once | ORAL | Status: AC
  Administered 2019-12-17: 750 mg via ORAL
  Filled 2019-12-17: qty 2
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
## 2019-12-17 NOTE — ED Notes (Signed)
An After Visit Summary was printed and given to the patient. Discharge instructions given and no further questions at this time.  Pt leaving with husband. 

## 2019-12-17 NOTE — Discharge Instructions (Signed)
I have prescribed a short course of muscle relaxers to help with your pain, please take these medications as prescribed.  Please be aware this medication can cause drowsiness, do not drink a whole or drive while taking this medication.  May also take some Aleve over-the-counter for symptomatic control.  If you experience any worsening symptoms please return to the emergency department.

## 2020-01-07 ENCOUNTER — Other Ambulatory Visit: Payer: Self-pay

## 2020-01-10 ENCOUNTER — Ambulatory Visit (INDEPENDENT_AMBULATORY_CARE_PROVIDER_SITE_OTHER): Payer: PPO | Admitting: Obstetrics and Gynecology

## 2020-01-10 ENCOUNTER — Other Ambulatory Visit: Payer: Self-pay

## 2020-01-10 ENCOUNTER — Encounter: Payer: Self-pay | Admitting: Obstetrics and Gynecology

## 2020-01-10 VITALS — BP 124/76 | HR 76 | Resp 10 | Ht 63.0 in | Wt 158.0 lb

## 2020-01-10 DIAGNOSIS — Z9189 Other specified personal risk factors, not elsewhere classified: Secondary | ICD-10-CM

## 2020-01-10 DIAGNOSIS — Z01419 Encounter for gynecological examination (general) (routine) without abnormal findings: Secondary | ICD-10-CM | POA: Diagnosis not present

## 2020-01-10 DIAGNOSIS — Z8739 Personal history of other diseases of the musculoskeletal system and connective tissue: Secondary | ICD-10-CM | POA: Diagnosis not present

## 2020-01-10 DIAGNOSIS — Z90710 Acquired absence of both cervix and uterus: Secondary | ICD-10-CM | POA: Diagnosis not present

## 2020-01-10 DIAGNOSIS — N951 Menopausal and female climacteric states: Secondary | ICD-10-CM

## 2020-01-10 DIAGNOSIS — N9489 Other specified conditions associated with female genital organs and menstrual cycle: Secondary | ICD-10-CM

## 2020-01-10 MED ORDER — GABAPENTIN 100 MG PO CAPS: ORAL_CAPSULE | ORAL | 3 refills | Status: DC

## 2020-01-10 MED ORDER — VENLAFAXINE HCL ER 37.5 MG PO CP24: ORAL_CAPSULE | ORAL | 3 refills | Status: DC

## 2020-01-10 NOTE — Patient Instructions (Signed)
EXERCISE AND DIET:  We recommended that you start or continue a regular exercise program for good health. Regular exercise means any activity that makes your heart beat faster and makes you sweat.  We recommend exercising at least 30 minutes per day at least 3 days a week, preferably 4 or 5.  We also recommend a diet low in fat and sugar.  Inactivity, poor dietary choices and obesity can cause diabetes, heart attack, stroke, and kidney damage, among others.    ALCOHOL AND SMOKING:  Women should limit their alcohol intake to no more than 7 drinks/beers/glasses of wine (combined, not each!) per week. Moderation of alcohol intake to this level decreases your risk of breast cancer and liver damage. And of course, no recreational drugs are part of a healthy lifestyle.  And absolutely no smoking or even second hand smoke. Most people know smoking can cause heart and lung diseases, but did you know it also contributes to weakening of your bones? Aging of your skin?  Yellowing of your teeth and nails?  CALCIUM AND VITAMIN D:  Adequate intake of calcium and Vitamin D are recommended.  The recommendations for exact amounts of these supplements seem to change often, but generally speaking 1,200 mg of calcium (between diet and supplement) and 800 units of Vitamin D per day seems prudent. Certain women may benefit from higher intake of Vitamin D.  If you are among these women, your doctor will have told you during your visit.    PAP SMEARS:  Pap smears, to check for cervical cancer or precancers,  have traditionally been done yearly, although recent scientific advances have shown that most women can have pap smears less often.  However, every woman still should have a physical exam from her gynecologist every year. It will include a breast check, inspection of the vulva and vagina to check for abnormal growths or skin changes, a visual exam of the cervix, and then an exam to evaluate the size and shape of the uterus and  ovaries.  And after 68 years of age, a rectal exam is indicated to check for rectal cancers. We will also provide age appropriate advice regarding health maintenance, like when you should have certain vaccines, screening for sexually transmitted diseases, bone density testing, colonoscopy, mammograms, etc.   MAMMOGRAMS:  All women over 40 years old should have a yearly mammogram. Many facilities now offer a "3D" mammogram, which may cost around $50 extra out of pocket. If possible,  we recommend you accept the option to have the 3D mammogram performed.  It both reduces the number of women who will be called back for extra views which then turn out to be normal, and it is better than the routine mammogram at detecting truly abnormal areas.    COLON CANCER SCREENING: Now recommend starting at age 45. At this time colonoscopy is not covered for routine screening until 50. There are take home tests that can be done between 45-49.   COLONOSCOPY:  Colonoscopy to screen for colon cancer is recommended for all women at age 50.  We know, you hate the idea of the prep.  We agree, BUT, having colon cancer and not knowing it is worse!!  Colon cancer so often starts as a polyp that can be seen and removed at colonscopy, which can quite literally save your life!  And if your first colonoscopy is normal and you have no family history of colon cancer, most women don't have to have it again for   10 years.  Once every ten years, you can do something that may end up saving your life, right?  We will be happy to help you get it scheduled when you are ready.  Be sure to check your insurance coverage so you understand how much it will cost.  It may be covered as a preventative service at no cost, but you should check your particular policy.      Breast Self-Awareness Breast self-awareness means being familiar with how your breasts look and feel. It involves checking your breasts regularly and reporting any changes to your  health care provider. Practicing breast self-awareness is important. A change in your breasts can be a sign of a serious medical problem. Being familiar with how your breasts look and feel allows you to find any problems early, when treatment is more likely to be successful. All women should practice breast self-awareness, including women who have had breast implants. How to do a breast self-exam One way to learn what is normal for your breasts and whether your breasts are changing is to do a breast self-exam. To do a breast self-exam: Look for Changes  1. Remove all the clothing above your waist. 2. Stand in front of a mirror in a room with good lighting. 3. Put your hands on your hips. 4. Push your hands firmly downward. 5. Compare your breasts in the mirror. Look for differences between them (asymmetry), such as: ? Differences in shape. ? Differences in size. ? Puckers, dips, and bumps in one breast and not the other. 6. Look at each breast for changes in your skin, such as: ? Redness. ? Scaly areas. 7. Look for changes in your nipples, such as: ? Discharge. ? Bleeding. ? Dimpling. ? Redness. ? A change in position. Feel for Changes Carefully feel your breasts for lumps and changes. It is best to do this while lying on your back on the floor and again while sitting or standing in the shower or tub with soapy water on your skin. Feel each breast in the following way:  Place the arm on the side of the breast you are examining above your head.  Feel your breast with the other hand.  Start in the nipple area and make  inch (2 cm) overlapping circles to feel your breast. Use the pads of your three middle fingers to do this. Apply light pressure, then medium pressure, then firm pressure. The light pressure will allow you to feel the tissue closest to the skin. The medium pressure will allow you to feel the tissue that is a little deeper. The firm pressure will allow you to feel the tissue  close to the ribs.  Continue the overlapping circles, moving downward over the breast until you feel your ribs below your breast.  Move one finger-width toward the center of the body. Continue to use the  inch (2 cm) overlapping circles to feel your breast as you move slowly up toward your collarbone.  Continue the up and down exam using all three pressures until you reach your armpit.  Write Down What You Find  Write down what is normal for each breast and any changes that you find. Keep a written record with breast changes or normal findings for each breast. By writing this information down, you do not need to depend only on memory for size, tenderness, or location. Write down where you are in your menstrual cycle, if you are still menstruating. If you are having trouble noticing differences   in your breasts, do not get discouraged. With time you will become more familiar with the variations in your breasts and more comfortable with the exam. How often should I examine my breasts? Examine your breasts every month. If you are breastfeeding, the best time to examine your breasts is after a feeding or after using a breast pump. If you menstruate, the best time to examine your breasts is 5-7 days after your period is over. During your period, your breasts are lumpier, and it may be more difficult to notice changes. When should I see my health care provider? See your health care provider if you notice:  A change in shape or size of your breasts or nipples.  A change in the skin of your breast or nipples, such as a reddened or scaly area.  Unusual discharge from your nipples.  A lump or thick area that was not there before.  Pain in your breasts.  Anything that concerns you.  

## 2020-01-10 NOTE — Progress Notes (Signed)
70 y.o. G38P2002 Married White or Caucasian Not Hispanic or Latino female here for annual exam. Patient states that her bowel movements are back to "a thin string", would like to check her rectocele repair.  Sometimes she strains sometimes she doesn't. She noticed a slight fullness in her vagina the other day, took a laxative, had a BM and everything felt fine. She doesn't feel she has bulge.  TLH/BSO/rectocele repair in 12/19.   Sexually active, no pain  TC risk of breast cancer in 23.4%.  Arthritis is excruciating. Degenerative disc disease C3-C7 and T1. 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   She is still having vasomotors symptoms, worse with wine or coffee, stress. They eased off for a while, then got bad again.     Patient's last menstrual period was 09/23/2012.          Sexually active: Yes.    The current method of family planning is post menopausal status.    Exercising: Yes.    swimming and walking Smoker:  no  Health Maintenance: Pap:  11/21/2017 WNL NEG HPV History of abnormal Pap:  no MMG:  08/10/18 BIRADS 1 negative/density c  02/17/19 Bilateral Breast MR - BIRADS 1 negative  BMD:   04/06/19 Osteopenia, t score -1.7, FRAX 10/1.4% Colonoscopy: 01/22/19 f/u 5-10 years TDaP:  Unsure -- 2007 per EPIC Gardasil: N/a   reports that she has never smoked. She has never used smokeless tobacco. She reports current alcohol use of about 8.0 standard drinks of alcohol per week. She reports that she does not use drugs. Retired. Kids are local. She has a 74 week old granddaughter (with her son). Daughter recently married.   Past Medical History:  Diagnosis Date  . Anemia   . Asthma   . Chronic constipation   . Cubital tunnel syndrome, bilateral   . Family history of adverse reaction to anesthesia    sister and mother-- ponv  . Family history of breast cancer   . Lactose intolerance   . 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 OA (osteoarthritis)   . Osteopenia    . PONV (postoperative nausea and vomiting)   . Raynauds syndrome   . Seasonal affective disorder ([place])   . SUI (stress urinary incontinence, female)   . Superficial thrombophlebitis    x2  left lower leg superficial in  2006  prior to vein stripping     Past Surgical History:  Procedure Laterality Date  . CYSTOSCOPY N/A 07/14/2018   Procedure: CYSTOSCOPY;  Surgeon: Salvadore Dom, MD;  Location: [facility];  Service: Gynecology;  Laterality: N/A;  . D & C HYSTEROSCOPY W/ RESECTION FIBROID  09-21-2002  dr Mohammed Kindle  [place] W/ RESECTION POLYP  12-24-2010  dr Joan Flores  [facility]  . DILATION AND CURETTAGE OF UTERUS    . ESOPHAGEAL DILATION  2013  --- [facility]   Omega dilation for Achalasia  . ESOPHAGOGASTRODUODENOSCOPY N/A 09/20/2017   Procedure: ESOPHAGOGASTRODUODENOSCOPY (EGD);  Surgeon: Clarene Essex, MD;  Location: [facility];  Service: Endoscopy;  Laterality: N/A;  . HELLER MYOTOMY  4098   fundoplication repair hiatal hernia  . KNEE ARTHROSCOPY Left 2006 &  09-27-2003  . KNEE ARTHROSCOPY W/ SYNOVECTOMY Right 09-20-2009  dr Wynelle Link  [facility]  . KNEE SURGERY Right 1962  . RECTOCELE REPAIR N/A 07/14/2018   Procedure: POSTERIOR REPAIR (RECTOCELE);  Surgeon: Salvadore Dom, MD;  Location: [facility];  Service: Gynecology;  Laterality:  N/A;  . ROTATOR CUFF REPAIR Right 09/2011  . TONSILLECTOMY AND ADENOIDECTOMY  1961  . TOTAL KNEE ARTHROPLASTY Bilateral left 02-05-2006 ;  right 08-14-2007   dr Wynelle Link  [facility]  . TOTAL KNEE REVISION Right 11/26/2017   Procedure: Right knee polyethylene revision;  Surgeon: Gaynelle Arabian, MD;  Location: [facility];  Service: Orthopedics;  Laterality: Right;  Adductor Block  . VARICOSE VEIN SURGERY  09/2005    Current Outpatient Medications  Medication Sig Dispense Refill  . ADVAIR DISKUS 250-50 MCG/DOSE AEPB TAKE 1 PUFF BY MOUTH TWICE A DAY (Patient taking differently: Inhale 1 puff into the lungs 2 (two) times daily  as needed (asthma). ) 180 each 1  . budesonide-formoterol (SYMBICORT) 160-4.5 MCG/ACT inhaler Inhale 2 puffs into the lungs 2 (two) times daily. 1 Inhaler 0  . buPROPion (WELLBUTRIN XL) 150 MG 24 hr tablet TAKE 1 TABLET BY MOUTH EVERY DAY 90 tablet 0  . CALCIUM PO Take 15 mLs by mouth daily.    . Cholecalciferol (VITAMIN D3) 1000 units CAPS Take 1,000 Units by mouth daily.    . diclofenac Sodium (VOLTAREN) 1 % GEL Voltaren 1 % topical gel    . docusate sodium (COLACE) 100 MG capsule Take 100 mg by mouth 2 (two) times daily as needed for mild constipation.    Marland Kitchen esomeprazole (NEXIUM) 40 MG capsule Take 40 mg by mouth See admin instructions. Take 40 mg by mouth daily. May take additional 40 mg if needed for acid reflux    . gabapentin (NEURONTIN) 100 MG capsule TAKE 1 CAPSULE BY MOUTH AT BEDTIME (WITH 300MG  CAPSULE) 90 capsule 3  . gabapentin (NEURONTIN) 300 MG capsule Take with the 100 mg tablet of gabapentin 90 capsule 3  . hydrochlorothiazide (HYDRODIURIL) 12.5 MG tablet Take 1 tablet (12.5 mg total) by mouth daily. 90 tablet 1  . ibuprofen (ADVIL,MOTRIN) 200 MG tablet Take 200 mg by mouth every 6 (six) hours as needed.    . traMADol (ULTRAM) 50 MG tablet Take 50 mg by mouth 3 (three) times daily as needed.    . venlafaxine XR (EFFEXOR-XR) 37.5 MG 24 hr capsule TAKE 1 CAPSULE BY MOUTH EVERY DAY WITH BREAKFAST 90 capsule 3   No current facility-administered medications for this visit.    Family History  Problem Relation Age of Onset  . Hypertension Father   . Heart disease Father   . Stroke Father   . Cancer Father        Testicular, Bladder, Prostate, Lung  . Heart disease Mother   . Stroke Mother   . Osteoporosis Mother   . Uterine cancer Sister 57  . Breast cancer Sister   . Cancer Sister        rectal, liver,lymph nodes  . Diabetes Maternal Grandfather   . Heart disease Maternal Grandfather   . Diabetes Paternal Grandmother   . Breast cancer Maternal Aunt     Review of  Systems  Constitutional: Negative.   HENT: Negative.   Eyes: Negative.   Respiratory: Negative.   Cardiovascular: Negative.   Gastrointestinal: Negative.   Endocrine: Negative.   Genitourinary: Negative.   Musculoskeletal: Negative.   Skin: Negative.   Allergic/Immunologic: Negative.   Neurological: Negative.   Hematological: Negative.   Psychiatric/Behavioral: Negative.     Exam:   BP 124/76 (BP Location: Right Arm, Patient Position: Sitting, Cuff Size: Normal)   Pulse 76   Resp 10   Ht 5\' 3"  (1.6 m)   Wt 158 lb (71.7 kg)  LMP 09/23/2012   BMI 27.99 kg/m   Weight change: @WEIGHTCHANGE @ Height:   Height: 5\' 3"  (160 cm)  Ht Readings from Last 3 Encounters:  01/10/20 5\' 3"  (1.6 m)  12/17/19 5' 2.5" (1.588 m)  10/28/19 5\' 3"  (1.6 m)    General appearance: alert, cooperative and appears stated age Head: Normocephalic, without obvious abnormality, atraumatic Neck: no adenopathy, supple, symmetrical, trachea midline and thyroid normal to inspection and palpation Lungs: clear to auscultation bilaterally Cardiovascular: regular rate and rhythm Breasts: normal appearance, no masses or tenderness Abdomen: soft, non-tender; non distended,  no masses,  no organomegaly Extremities: extremities normal, atraumatic, no cyanosis or edema Skin: Skin color, texture, turgor normal. No rashes or lesions Lymph nodes: Cervical, supraclavicular, and axillary nodes normal. No abnormal inguinal nodes palpated Neurologic: Grossly normal   Pelvic: External genitalia:  no lesions              Urethra:  normal appearing urethra with no masses, tenderness or lesions              Bartholins and Skenes: normal                 Vagina: normal appearing vagina with normal color and discharge, no lesions, no prolapse noted (examined supine only with and without valsalva)              Cervix: absent               Bimanual Exam:  Uterus:  uterus absent              Adnexa: no mass, fullness,  tenderness               Rectovaginal: Confirms               Anus:  normal sphincter tone, no lesions  Terence Lux chaperoned for the exam.  A:  Well Woman with normal exam  Vasomotor symptoms, some help with effexor and gabapentin, will increase to taking one gabapentin in the am (currently just at night)  Some bowel changes, normal exam, colonoscopy UTD, she will f/u with primary  P:   No pap needed  She will schedule mammogram (overdue), then she will call to set up her breast MRI 6 months later  DEXA and colonoscopy UTD  Discussed breast self exam  Discussed calcium and vit D intake  Labs with primary

## 2020-02-11 ENCOUNTER — Other Ambulatory Visit: Payer: Self-pay | Admitting: Physician Assistant

## 2020-02-17 ENCOUNTER — Other Ambulatory Visit: Payer: Self-pay | Admitting: Physician Assistant

## 2020-05-05 ENCOUNTER — Other Ambulatory Visit: Payer: Self-pay | Admitting: Obstetrics and Gynecology

## 2020-05-05 DIAGNOSIS — Z1231 Encounter for screening mammogram for malignant neoplasm of breast: Secondary | ICD-10-CM

## 2020-05-11 ENCOUNTER — Ambulatory Visit: Payer: PPO | Admitting: Obstetrics and Gynecology

## 2020-05-11 ENCOUNTER — Encounter: Payer: Self-pay | Admitting: Obstetrics and Gynecology

## 2020-05-11 ENCOUNTER — Other Ambulatory Visit: Payer: Self-pay

## 2020-05-11 ENCOUNTER — Telehealth: Payer: Self-pay

## 2020-05-11 VITALS — BP 140/64 | HR 75 | Ht 62.5 in | Wt 160.0 lb

## 2020-05-11 DIAGNOSIS — S2002XA Contusion of left breast, initial encounter: Secondary | ICD-10-CM

## 2020-05-11 NOTE — Telephone Encounter (Signed)
AEX 12/2019 FH breast cancer Last Breast MRI  02/2019-Birads, 1 Neg, next scheduled for 05/26/20- screening MMG TC risk 23.4%  Spoke with pt. Pt states having bruised area under left breast near armpit x 1 week. Pt denies any other skin changes, lumps or nipple d/c. Pt advised to have OV for further evaluation. Pt agreeable. Pt scheduled for OV today 10/28 at 2 pm with Dr Talbert Nan. Pt verbalized understanding to date and time of appt.  Encounter closed

## 2020-05-11 NOTE — Telephone Encounter (Signed)
Patient is calling in regards to "spot on breast that looks like a bruise".

## 2020-05-11 NOTE — Progress Notes (Signed)
GYNECOLOGY  VISIT   HPI: 69 y.o.   Married White or Caucasian Not Hispanic or Latino  female   [phone] with Patient's last menstrual period was 09/23/2012.   here for a place on her left breast that looks like a bruise and it has been present for about a week. She has a family history of breast cancer with her maternal aunt. Her arms are sore from holding her 21 month old granddaughter the last two days.  She has an elevated risk of breast cancer of 23.4%. Last mammogram was in 1/20 and last Breast MRI was in 8/20. Both were normal.   Her breast started looking red a week ago. She is very active, bruises easily. She is playing with a puppy, moving furniture and holding a baby. Not sore or hot.   GYNECOLOGIC HISTORY: Patient's last menstrual period was 09/23/2012. Contraception:PMP Menopausal hormone therapy: none         OB History    Gravida  2   Para  2   Term  2   Preterm      AB      Living  2     SAB      TAB      Ectopic      Multiple      Live Births  2              Patient Active Problem List   Diagnosis Date Noted  . Snoring 08/17/2019  . Gastroesophageal reflux disease with esophagitis without hemorrhage 08/17/2019  . Bilateral shoulder pain 08/11/2019  . Cervical radiculopathy 07/21/2019  . Chronic constipation   . PONV (postoperative nausea and vomiting)   . Cubital tunnel syndrome, bilateral   . Lactose intolerance   . Seasonal affective disorder ([place])   . SUI (stress urinary incontinence, female)   . Anemia   . Raynauds syndrome   . Superficial thrombophlebitis   . 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  . Laryngopharyngeal reflux (LPR) 04/27/2019  . Deviated septum 12/05/2018  . Sacroiliac joint pain 10/29/2018  . Status post laparoscopic hysterectomy 07/14/2018  . Increased risk of breast cancer, 23.4% 05/10/2018  . Menopausal vasomotor syndrome 05/10/2018  . Genetic testing 03/19/2018  . Family history of breast cancer   . 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 Osteopenia   . Achalasia 10/28/2017  . Thoracic spondylosis 10/14/2017  . 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  . Scoliosis 10/16/2012  . Rectocele without mention of uterine prolapse 10/14/2012  . OA (osteoarthritis) 10/07/2012  . Eczema 08/08/2011  . Hyperlipidemia 07/24/2011  . Allergic rhinitis 05/24/2009  . Lesion of ulnar nerve 05/24/2009  . Essential hypertension 02/27/2009  . Asthma 02/27/2009  . Lordosis of lumbar region 02/2000    Past Medical History:  Diagnosis Date  . Anemia   . Asthma   . Chronic constipation   . Cubital tunnel syndrome, bilateral   . Family history of adverse reaction to anesthesia    sister and mother-- ponv  . Family history of breast cancer   . Lactose intolerance   . 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 OA (osteoarthritis)   . Osteopenia   . PONV (postoperative nausea and vomiting)   . Raynauds syndrome   . Seasonal affective disorder ([place])   . SUI (stress urinary incontinence, female)   . Superficial thrombophlebitis    x2  left lower leg superficial in  2006  prior to vein stripping  Past Surgical History:  Procedure Laterality Date  . CYSTOSCOPY N/A 07/14/2018   Procedure: CYSTOSCOPY;  Surgeon: Salvadore Dom, MD;  Location: [facility];  Service: Gynecology;  Laterality: N/A;  . D & C HYSTEROSCOPY W/ RESECTION FIBROID  09-21-2002  dr Mohammed Kindle  [place] W/ RESECTION POLYP  12-24-2010  dr Joan Flores  [facility]  . DILATION AND CURETTAGE OF UTERUS    . ESOPHAGEAL DILATION  2013  --- [facility]   Omega dilation for Achalasia  . ESOPHAGOGASTRODUODENOSCOPY N/A 09/20/2017   Procedure: ESOPHAGOGASTRODUODENOSCOPY (EGD);  Surgeon: Clarene Essex, MD;  Location: [facility];  Service: Endoscopy;  Laterality: N/A;  . HELLER MYOTOMY  0272   fundoplication repair hiatal hernia  . KNEE ARTHROSCOPY Left 2006 &  09-27-2003  . KNEE ARTHROSCOPY W/ SYNOVECTOMY Right  09-20-2009  dr Wynelle Link  [facility]  . KNEE SURGERY Right 1962  . RECTOCELE REPAIR N/A 07/14/2018   Procedure: POSTERIOR REPAIR (RECTOCELE);  Surgeon: Salvadore Dom, MD;  Location: [facility];  Service: Gynecology;  Laterality: N/A;  . ROTATOR CUFF REPAIR Right 09/2011  . TONSILLECTOMY AND ADENOIDECTOMY  1961  . TOTAL KNEE ARTHROPLASTY Bilateral left 02-05-2006 ;  right 08-14-2007   dr Wynelle Link  [facility]  . TOTAL KNEE REVISION Right 11/26/2017   Procedure: Right knee polyethylene revision;  Surgeon: Gaynelle Arabian, MD;  Location: [facility];  Service: Orthopedics;  Laterality: Right;  Adductor Block  . VARICOSE VEIN SURGERY  09/2005    Current Outpatient Medications  Medication Sig Dispense Refill  . ADVAIR DISKUS 250-50 MCG/DOSE AEPB TAKE 1 PUFF BY MOUTH TWICE A DAY (Patient taking differently: Inhale 1 puff into the lungs 2 (two) times daily as needed (asthma). ) 180 each 1  . budesonide-formoterol (SYMBICORT) 160-4.5 MCG/ACT inhaler Inhale 2 puffs into the lungs 2 (two) times daily. 1 Inhaler 0  . buPROPion (WELLBUTRIN XL) 150 MG 24 hr tablet TAKE 1 TABLET BY MOUTH EVERY DAY 90 tablet 0  . CALCIUM PO Take 15 mLs by mouth daily.    . Cholecalciferol (VITAMIN D3) 1000 units CAPS Take 1,000 Units by mouth daily.    . diclofenac Sodium (VOLTAREN) 1 % GEL Voltaren 1 % topical gel    . docusate sodium (COLACE) 100 MG capsule Take 100 mg by mouth 2 (two) times daily as needed for mild constipation.    Marland Kitchen esomeprazole (NEXIUM) 40 MG capsule Take 40 mg by mouth See admin instructions. Take 40 mg by mouth daily. May take additional 40 mg if needed for acid reflux    . gabapentin (NEURONTIN) 100 MG capsule TAKE 3 CAPSULE BY MOUTH AT BEDTIME and one capsule in the am. 360 capsule 3  . hydrochlorothiazide (HYDRODIURIL) 12.5 MG tablet TAKE 1 TABLET BY MOUTH EVERY DAY 90 tablet 1  . ibuprofen (ADVIL,MOTRIN) 200 MG tablet Take 200 mg by mouth every 6 (six) hours as needed.    . venlafaxine XR  (EFFEXOR-XR) 37.5 MG 24 hr capsule TAKE 1 CAPSULE BY MOUTH EVERY DAY WITH BREAKFAST 90 capsule 3   No current facility-administered medications for this visit.     ALLERGIES: Dilaudid [hydromorphone hcl], Glucagon, Lactose intolerance (gi), Wasp venom, and Wasp venom protein  Family History  Problem Relation Age of Onset  . Hypertension Father   . Heart disease Father   . Stroke Father   . Cancer Father        Testicular, Bladder, Prostate, Lung  . Heart disease Mother   .  Stroke Mother   . Osteoporosis Mother   . Uterine cancer Sister 81  . Breast cancer Sister   . Cancer Sister        rectal, liver,lymph nodes  . Diabetes Maternal Grandfather   . Heart disease Maternal Grandfather   . Diabetes Paternal Grandmother   . Breast cancer Maternal Aunt     Social History   Socioeconomic History  . Marital status: Married    Spouse name: Not on file  . Number of children: 2  . Years of education: Not on file  . Highest education level: Not on file  Occupational History  . Occupation: Retired     Comment: Forensic scientist  . Smoking status: Never Smoker  . Smokeless tobacco: Never Used  Vaping Use  . Vaping Use: Never used  Substance and Sexual Activity  . Alcohol use: Yes    Alcohol/week: 8.0 standard drinks    Types: 8 Glasses of wine per week  . Drug use: No  . Sexual activity: Yes    Partners: Male    Birth control/protection: Post-menopausal, Surgical    Comment: vasectomy, hysterectomy  Other Topics Concern  . Not on file  Social History Narrative   Daughter getting married Spring 2021; 1st grandchild due in June       Hobbies: Oil painting and antiquing    Social Determinants of Health   Financial Resource Strain:   . Difficulty of Paying Living Expenses: Not on file  Food Insecurity:   . Worried About Charity fundraiser in the Last Year: Not on file  . Ran Out of Food in the Last Year: Not on file  Transportation Needs:   . Lack of  Transportation (Medical): Not on file  . Lack of Transportation (Non-Medical): Not on file  Physical Activity:   . Days of Exercise per Week: Not on file  . Minutes of Exercise per Session: Not on file  Stress:   . Feeling of Stress : Not on file  Social Connections:   . Frequency of Communication with Friends and Family: Not on file  . Frequency of Social Gatherings with Friends and Family: Not on file  . Attends Religious Services: Not on file  . Active Member of Clubs or Organizations: Not on file  . Attends Archivist Meetings: Not on file  . Marital Status: Not on file  Intimate Partner Violence:   . Fear of Current or Ex-Partner: Not on file  . Emotionally Abused: Not on file  . Physically Abused: Not on file  . Sexually Abused: Not on file    Review of Systems  All other systems reviewed and are negative.   PHYSICAL EXAMINATION:    BP 140/64   Pulse 75   Ht 5' 2.5" (1.588 m)   Wt 160 lb (72.6 kg)   LMP 09/23/2012   SpO2 97%   BMI 28.80 kg/m     General appearance: alert, cooperative and appears stated age Breasts: no masses, in the upper outer left breast is some mild erythema, slightly tender, not warm. No other changes No supracervical or axillary adenopathy   ASSESSMENT Mild erythema and tenderness in the left breast, suspect bruise. Not warm, not worsening. Otherwise normal breast exam    PLAN She has a mammogram scheduled in a few weeks She will call if her symptoms worsen in the mean time She is also due for a breast MRI.

## 2020-05-26 ENCOUNTER — Other Ambulatory Visit: Payer: Self-pay

## 2020-05-26 ENCOUNTER — Ambulatory Visit
Admission: RE | Admit: 2020-05-26 | Discharge: 2020-05-26 | Disposition: A | Discharge status: Home / Self Care | Payer: PPO | Source: Ambulatory Visit | Attending: Obstetrics and Gynecology | Admitting: Obstetrics and Gynecology

## 2020-05-26 DIAGNOSIS — Z1231 Encounter for screening mammogram for malignant neoplasm of breast: Secondary | ICD-10-CM

## 2020-06-01 DIAGNOSIS — R110 Nausea: Secondary | ICD-10-CM | POA: Diagnosis not present

## 2020-06-01 DIAGNOSIS — K220 Achalasia of cardia: Secondary | ICD-10-CM | POA: Diagnosis not present

## 2020-06-01 DIAGNOSIS — K219 Gastro-esophageal reflux disease without esophagitis: Secondary | ICD-10-CM | POA: Diagnosis not present

## 2020-06-01 DIAGNOSIS — R6881 Early satiety: Secondary | ICD-10-CM | POA: Diagnosis not present

## 2020-06-01 DIAGNOSIS — J029 Acute pharyngitis, unspecified: Secondary | ICD-10-CM | POA: Diagnosis not present

## 2020-06-01 DIAGNOSIS — R630 Anorexia: Secondary | ICD-10-CM | POA: Diagnosis not present

## 2020-06-01 DIAGNOSIS — Z800 Family history of malignant neoplasm of digestive organs: Secondary | ICD-10-CM | POA: Diagnosis not present

## 2020-06-04 ENCOUNTER — Other Ambulatory Visit: Payer: Self-pay | Admitting: Physician Assistant

## 2020-06-23 DIAGNOSIS — M5416 Radiculopathy, lumbar region: Secondary | ICD-10-CM | POA: Diagnosis not present

## 2020-06-23 DIAGNOSIS — Z96651 Presence of right artificial knee joint: Secondary | ICD-10-CM | POA: Diagnosis not present

## 2020-06-23 DIAGNOSIS — Z96652 Presence of left artificial knee joint: Secondary | ICD-10-CM | POA: Diagnosis not present

## 2020-07-04 DIAGNOSIS — M5416 Radiculopathy, lumbar region: Secondary | ICD-10-CM | POA: Diagnosis not present

## 2020-07-13 DIAGNOSIS — M5416 Radiculopathy, lumbar region: Secondary | ICD-10-CM | POA: Diagnosis not present

## 2020-08-03 DIAGNOSIS — Z01812 Encounter for preprocedural laboratory examination: Secondary | ICD-10-CM | POA: Diagnosis not present

## 2020-08-08 DIAGNOSIS — R1314 Dysphagia, pharyngoesophageal phase: Secondary | ICD-10-CM | POA: Diagnosis not present

## 2020-08-08 DIAGNOSIS — R6881 Early satiety: Secondary | ICD-10-CM | POA: Diagnosis not present

## 2020-08-08 DIAGNOSIS — Z9889 Other specified postprocedural states: Secondary | ICD-10-CM | POA: Diagnosis not present

## 2020-08-08 DIAGNOSIS — K219 Gastro-esophageal reflux disease without esophagitis: Secondary | ICD-10-CM | POA: Diagnosis not present

## 2020-08-10 DIAGNOSIS — K219 Gastro-esophageal reflux disease without esophagitis: Secondary | ICD-10-CM | POA: Diagnosis not present

## 2020-08-17 DIAGNOSIS — M5416 Radiculopathy, lumbar region: Secondary | ICD-10-CM | POA: Diagnosis not present

## 2020-08-18 DIAGNOSIS — Z96653 Presence of artificial knee joint, bilateral: Secondary | ICD-10-CM | POA: Diagnosis not present

## 2020-08-18 DIAGNOSIS — M5416 Radiculopathy, lumbar region: Secondary | ICD-10-CM | POA: Diagnosis not present

## 2020-08-24 DIAGNOSIS — K219 Gastro-esophageal reflux disease without esophagitis: Secondary | ICD-10-CM | POA: Diagnosis not present

## 2020-08-24 DIAGNOSIS — R1310 Dysphagia, unspecified: Secondary | ICD-10-CM | POA: Diagnosis not present

## 2020-08-24 DIAGNOSIS — K220 Achalasia of cardia: Secondary | ICD-10-CM | POA: Diagnosis not present

## 2020-08-24 DIAGNOSIS — M5416 Radiculopathy, lumbar region: Secondary | ICD-10-CM | POA: Diagnosis not present

## 2020-08-24 DIAGNOSIS — R140 Abdominal distension (gaseous): Secondary | ICD-10-CM | POA: Diagnosis not present

## 2020-08-26 ENCOUNTER — Other Ambulatory Visit: Payer: Self-pay | Admitting: Physician Assistant

## 2020-08-30 DIAGNOSIS — M5416 Radiculopathy, lumbar region: Secondary | ICD-10-CM | POA: Diagnosis not present

## 2020-09-06 DIAGNOSIS — M5416 Radiculopathy, lumbar region: Secondary | ICD-10-CM | POA: Diagnosis not present

## 2020-09-10 ENCOUNTER — Other Ambulatory Visit: Payer: Self-pay | Admitting: Physician Assistant

## 2020-09-13 DIAGNOSIS — M5416 Radiculopathy, lumbar region: Secondary | ICD-10-CM | POA: Diagnosis not present

## 2020-09-20 DIAGNOSIS — M5416 Radiculopathy, lumbar region: Secondary | ICD-10-CM | POA: Diagnosis not present

## 2020-09-27 DIAGNOSIS — M5416 Radiculopathy, lumbar region: Secondary | ICD-10-CM | POA: Diagnosis not present

## 2020-10-06 DIAGNOSIS — M5416 Radiculopathy, lumbar region: Secondary | ICD-10-CM | POA: Diagnosis not present

## 2020-11-09 DIAGNOSIS — J00 Acute nasopharyngitis [common cold]: Secondary | ICD-10-CM | POA: Diagnosis not present

## 2020-11-09 DIAGNOSIS — Z20822 Contact with and (suspected) exposure to covid-19: Secondary | ICD-10-CM | POA: Diagnosis not present

## 2020-12-04 ENCOUNTER — Telehealth: Payer: Self-pay

## 2020-12-04 NOTE — Telephone Encounter (Signed)
LVM to call back and get scheduled for a virtual.

## 2020-12-04 NOTE — Telephone Encounter (Signed)
Please call pt and schedule virtual visit. She did not go to ED yesterday as recommended.

## 2020-12-04 NOTE — Telephone Encounter (Signed)
Nurse Assessment Nurse: Louie Boston, RN, Barnetta Chapel Date/Time (Eastern Time): 12/03/2020 4:16:21 PM Confirm and document reason for call. If symptomatic, describe symptoms. ---caller states she tested positive for covid friday night. symptoms started saturday am with cough didnt sleep. slept last night with nyquil. whistle/ wheezing has mild asthma. HA, fatigue, no fever but no thermometer. Does the patient have any new or worsening symptoms? ---Yes Will a triage be completed? ---Yes Related visit to physician within the last 2 weeks? ---No Does the PT have any chronic conditions? (i.e. diabetes, asthma, this includes High risk factors for pregnancy, etc.) ---Yes List chronic conditions. ---asthma, htn, arthritis, raynauds, aspirates liquids into the lungs Is this a behavioral health or substance abuse call? ---No Guidelines Guideline Title Affirmed Question Affirmed Notes Nurse Date/Time ([place] Time) COVID-19 - Diagnosed or Suspected MODERATE difficulty breathing (e.g., speaks in [place], [place] 12/03/2020 4:19:29 PM PLEASE NOTE: All timestamps contained within this report are represented as [place] Standard Time. CONFIDENTIALTY NOTICE: This fax transmission is intended only for the addressee. It contains information that is legally privileged, confidential or otherwise protected from use or disclosure. If you are not the intended recipient, you are strictly prohibited from reviewing, disclosing, copying using or disseminating any of this information or taking any action in reliance on or regarding this information. If you have received this fax in error, please notify us immediately by telephone so that we can arrange for its return to [place]. Phone: [phone], Toll-Free: [phone], Fax: [phone] Page: 2 of 2 Call Id: [id number] Guidelines Guideline Title Affirmed Question Affirmed Notes Nurse Date/Time Eilene Ghazi Time) phrases, SOB even at rest, pulse  100-120) Disp. Time Eilene Ghazi Time) Disposition Final User 12/03/2020 4:14:54 PM Send to Urgent Vassie Loll 12/03/2020 4:22:43 PM Go to ED Now Yes Louie Boston, RN, Ledell Noss Disagree/Comply Comply Caller Understands Yes PreDisposition InappropriateToAsk Care Advice Given Per Guideline GO TO ED NOW: * You need to be seen in the Emergency Department. CARE ADVICE given per COVID-19 - DIAGNOSED OR SUSPECTED (Adult) guideline. CALL EMS 911 IF: * Severe difficulty breathing occurs * Lips or face turns blue * Confusion occurs. ANOTHER ADULT SHOULD DRIVE: * It is better and safer if another adult drives instead of you. Referrals [facility] - ED

## 2020-12-05 ENCOUNTER — Telehealth (INDEPENDENT_AMBULATORY_CARE_PROVIDER_SITE_OTHER): Payer: PPO | Admitting: Family Medicine

## 2020-12-05 DIAGNOSIS — J45909 Unspecified asthma, uncomplicated: Secondary | ICD-10-CM | POA: Diagnosis not present

## 2020-12-05 DIAGNOSIS — U071 COVID-19: Secondary | ICD-10-CM | POA: Diagnosis not present

## 2020-12-05 MED ORDER — FLUTICASONE-SALMETEROL 250-50 MCG/ACT IN AEPB: INHALATION_SPRAY | RESPIRATORY_TRACT | 0 refills | Status: DC

## 2020-12-05 MED ORDER — MOLNUPIRAVIR EUA 200MG CAPSULE
4.0000 | ORAL_CAPSULE | Freq: Two times a day (BID) | ORAL | 0 refills | Status: AC
Start: 2020-12-05 — End: 2020-12-10

## 2020-12-05 MED ORDER — BENZONATATE 100 MG PO CAPS
100.0000 mg | ORAL_CAPSULE | Freq: Three times a day (TID) | ORAL | 0 refills | Status: DC | PRN
Start: 2020-12-05 — End: 2022-01-25

## 2020-12-05 NOTE — Progress Notes (Signed)
Virtual Visit via Video Note  I connected with Beth Peters  on 12/05/20 at 12:20 PM EDT by a video enabled telemedicine application and verified that I am speaking with the correct person using two identifiers.  Location patient: home, [place] Location provider:work or home office Persons participating in the virtual visit: patient, provider  I discussed the limitations of evaluation and management by telemedicine and the availability of in person appointments. The patient expressed understanding and agreed to proceed.   HPI:  Acute telemedicine visit for Covid19: -Onset: 4 days ago, had positive test the 1st day -Symptoms include: sore throat, congestion, fatigue, cough, mild diarrhea -Denies: CP, SOB, NV, inability to eat/drink/get out of bed -doing much better today -Has tried: dayquil, nyquil, delsym -Pertinent past medical history: see below - has asthma; uses advair during allergy season, requests refill - report was in [place] for 3 years and now back here - doc there was refilling, reports asthma is stable and that she has plenty of albuterol to use if needed -Pertinent medication allergies: Allergies  Allergen Reactions  . Dilaudid [Hydromorphone Hcl] Hives, Shortness Of Breath and Itching  . Glucagon Anaphylaxis  . Lactose Intolerance (Gi)   . Wasp Venom Itching and Swelling  . Wasp Venom Protein Itching and Hives   -COVID-19 vaccine status: vaccinated and boosted -does not have recent lab work  ROS: See pertinent positives and negatives per HPI.  Past Medical History:  Diagnosis Date  . Anemia   . Asthma   . Chronic constipation   . Cubital tunnel syndrome, bilateral   . Family history of adverse reaction to anesthesia    sister and mother-- ponv  . Family history of breast cancer   . Lactose intolerance   . 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 OA (osteoarthritis)   . Osteopenia   . PONV (postoperative nausea and vomiting)   . Raynauds syndrome   . Seasonal affective  disorder ([place])   . SUI (stress urinary incontinence, female)   . Superficial thrombophlebitis    x2  left lower leg superficial in  2006  prior to vein stripping     Past Surgical History:  Procedure Laterality Date  . CYSTOSCOPY N/A 07/14/2018   Procedure: CYSTOSCOPY;  Surgeon: Salvadore Dom, MD;  Location: [facility];  Service: Gynecology;  Laterality: N/A;  . D & C HYSTEROSCOPY W/ RESECTION FIBROID  09-21-2002  dr Mohammed Kindle  [place] W/ RESECTION POLYP  12-24-2010  dr Joan Flores  [facility]  . DILATION AND CURETTAGE OF UTERUS    . ESOPHAGEAL DILATION  2013  --- [facility]   Omega dilation for Achalasia  . ESOPHAGOGASTRODUODENOSCOPY N/A 09/20/2017   Procedure: ESOPHAGOGASTRODUODENOSCOPY (EGD);  Surgeon: Clarene Essex, MD;  Location: [facility];  Service: Endoscopy;  Laterality: N/A;  . HELLER MYOTOMY  8938   fundoplication repair hiatal hernia  . KNEE ARTHROSCOPY Left 2006 &  09-27-2003  . KNEE ARTHROSCOPY W/ SYNOVECTOMY Right 09-20-2009  dr Wynelle Link  [facility]  . KNEE SURGERY Right 1962  . RECTOCELE REPAIR N/A 07/14/2018   Procedure: POSTERIOR REPAIR (RECTOCELE);  Surgeon: Salvadore Dom, MD;  Location: [facility];  Service: Gynecology;  Laterality: N/A;  . ROTATOR CUFF REPAIR Right 09/2011  . TONSILLECTOMY AND ADENOIDECTOMY  1961  . TOTAL KNEE ARTHROPLASTY Bilateral left 02-05-2006 ;  right 08-14-2007   dr Wynelle Link  [facility]  . TOTAL KNEE REVISION Right 11/26/2017   Procedure: Right knee polyethylene revision;  Surgeon: Gaynelle Arabian, MD;  Location: [facility];  Service: Orthopedics;  Laterality: Right;  Adductor Block  . VARICOSE VEIN SURGERY  09/2005     Current Outpatient Medications:  .  benzonatate (TESSALON PERLES) 100 MG capsule, Take 1 capsule (100 mg total) by mouth 3 (three) times daily as needed., Disp: 20 capsule, Rfl: 0 .  molnupiravir EUA 200 mg CAPS, Take 4 capsules (800 mg total) by mouth 2 (two) times daily for 5 days., Disp: 40  capsule, Rfl: 0 .  buPROPion (WELLBUTRIN XL) 150 MG 24 hr tablet, TAKE 1 TABLET BY MOUTH EVERY DAY, Disp: 90 tablet, Rfl: 0 .  CALCIUM PO, Take 15 mLs by mouth daily., Disp: , Rfl:  .  Cholecalciferol (VITAMIN D3) 1000 units CAPS, Take 1,000 Units by mouth daily., Disp: , Rfl:  .  diclofenac Sodium (VOLTAREN) 1 % GEL, Voltaren 1 % topical gel, Disp: , Rfl:  .  docusate sodium (COLACE) 100 MG capsule, Take 100 mg by mouth 2 (two) times daily as needed for mild constipation., Disp: , Rfl:  .  esomeprazole (NEXIUM) 40 MG capsule, Take 40 mg by mouth See admin instructions. Take 40 mg by mouth daily. May take additional 40 mg if needed for acid reflux, Disp: , Rfl:  .  fluticasone-salmeterol (ADVAIR DISKUS) 250-50 MCG/ACT AEPB, TAKE 1 PUFF BY MOUTH TWICE A DAY, Disp: 180 each, Rfl: 0 .  gabapentin (NEURONTIN) 100 MG capsule, TAKE 3 CAPSULE BY MOUTH AT BEDTIME and one capsule in the am., Disp: 360 capsule, Rfl: 3 .  hydrochlorothiazide (HYDRODIURIL) 12.5 MG tablet, TAKE 1 TABLET BY MOUTH EVERY DAY, Disp: 90 tablet, Rfl: 1 .  ibuprofen (ADVIL,MOTRIN) 200 MG tablet, Take 200 mg by mouth every 6 (six) hours as needed., Disp: , Rfl:  .  venlafaxine XR (EFFEXOR-XR) 37.5 MG 24 hr capsule, TAKE 1 CAPSULE BY MOUTH EVERY DAY WITH BREAKFAST, Disp: 90 capsule, Rfl: 3  EXAM:  VITALS per patient if applicable:  GENERAL: alert, oriented, appears well and in no acute distress  HEENT: atraumatic, conjunttiva clear, no obvious abnormalities on inspection of external nose and ears  NECK: normal movements of the head and neck  LUNGS: on inspection no signs of respiratory distress, breathing rate appears normal, no obvious gross SOB, gasping or wheezing  CV: no obvious cyanosis  MS: moves all visible extremities without noticeable abnormality  PSYCH/NEURO: pleasant and cooperative, no obvious depression or anxiety, speech and thought processing grossly intact  ASSESSMENT AND PLAN:  Discussed the following  assessment and plan:  COVID-19  Asthma, unspecified asthma severity, unspecified whether complicated, unspecified whether persistent - Plan: fluticasone-salmeterol (ADVAIR DISKUS) 250-50 MCG/ACT AEPB  -we discussed possible serious and likely etiologies, options for evaluation and workup, limitations of telemedicine visit vs in person visit, treatment, treatment risks and precautions. Pt prefers to treat via telemedicine empirically rather than in person at this moment.  Discussed treatment options, ideal treatment window, potential complications, isolation and precautions for COVID-19.   The patient opted for treatment with Molnupiravir due to being higher risk for complications of covid or severe disease. Discussed EUA status limited knowledge of risks/interactions/side effects per EUA document vs possible benefits and precautions share with patient and provided in patient instructions. Also, she did want a prescription for cough, Tessalon Rx sent.  Other symptomatic care measures summarized in patient instructions. For her asthma, she reports is stable and is not currently having wheezing or SOB. She is requesting a fefill of her maintenance medications -  sent. Reports has rescue inhaler on hand and declines refill.  Work/School slipped offered:declined Scheduled follow up with PCP offered: as needed.  Advised to seek prompt in person care if worsening, new symptoms arise, or if is not improving with treatment. Discussed options for inperson care if PCP office not available. Did let this patient know that I only do telemedicine on Tuesdays and Thursdays for [place]. Advised to schedule follow up visit with PCP or [facility] if any further questions or concerns to avoid delays in care.   I discussed the assessment and treatment plan with the patient. The patient was provided an opportunity to ask questions and all were answered. The patient agreed with the plan and demonstrated an understanding of the  instructions.     Lucretia Kern, DO

## 2020-12-05 NOTE — Patient Instructions (Addendum)
HOME CARE TIPS:   -I sent the medication(s) we discussed to your pharmacy: Meds ordered this encounter  Medications  . molnupiravir EUA 200 mg CAPS    Sig: Take 4 capsules (800 mg total) by mouth 2 (two) times daily for 5 days.    Dispense:  40 capsule    Refill:  0  . benzonatate (TESSALON PERLES) 100 MG capsule    Sig: Take 1 capsule (100 mg total) by mouth 3 (three) times daily as needed.    Dispense:  20 capsule    Refill:  0  . fluticasone-salmeterol (ADVAIR DISKUS) 250-50 MCG/ACT AEPB    Sig: TAKE 1 PUFF BY MOUTH TWICE A DAY    Dispense:  180 each    Refill:  0     -I sent in the [place] treatment or referral you requested per our discussion. Please see the information provided below and discuss further with the pharmacist/treatment team.   -can use nasal saline a few times per day if you have nasal congestion; sometimes  a short course of Afrin nasal spray for 3 days can help with symptoms as well  -stay hydrated, drink plenty of fluids and eat small healthy meals - avoid dairy  -can take 1000 IU (50mg) Vit D3 and 100-500 mg of Vit C daily per instructions  -If the Covid test is positive, check out the [facility] for more information on home care, transmission and treatment for COVID19  -follow up with your doctor in 2-3 days unless improving and feeling better  -stay home while sick, except to seek medical care. If you have COVID19, ideally it would be best to stay home for a full 10 days since the onset of symptoms PLUS one day of no fever and feeling better. Wear a good mask that fits snugly (such as N95 or KN95) if around others to reduce the risk of transmission.  It was nice to meet you today, and I really hope you are feeling better soon. I help [place] out with telemedicine visits on Tuesdays and Thursdays and am available for visits on those days. If you have any concerns or questions following this visit please schedule a follow up visit with your Primary  Care doctor or seek care at a local urgent care clinic to avoid delays in care.    Seek in person care or schedule a follow up video visit promptly if your symptoms worsen, new concerns arise or you are not improving with treatment. Call 911 and/or seek emergency care if your symptoms are severe or life threatening.     Fact Sheet for Patients And Caregivers Emergency Use Authorization (EUA) Of LAGEVRIOT (molnupiravir) capsules For Coronavirus Disease 2019 (COVID-19)  What is the most important information I should know about LAGEVRIO? LAGEVRIO may cause serious side effects, including: . LAGEVRIO may cause harm to your unborn baby. It is not known if LAGEVRIO will harm your baby if you take LAGEVRIO during pregnancy. o LAGEVRIO is not recommended for use in pregnancy. o LAGEVRIO has not been studied in pregnancy. LAGEVRIO was studied in pregnant animals only. When LAGEVRIO was given to pregnant animals, LAGEVRIO caused harm to their unborn babies. o You and your healthcare provider may decide that you should take LAGEVRIO during pregnancy if there are no other COVID-19 treatment options approved or authorized by the FDA that are accessible or clinically appropriate for you. o If you and your healthcare provider decide that you should take LGoodrichduring pregnancy, you and  your healthcare provider should discuss the known and potential benefits and the potential risks of taking LAGEVRIO during pregnancy. For individuals who are able to become pregnant: . You should use a reliable method of birth control (contraception) consistently and correctly during treatment with LAGEVRIO and for 4 days after the last dose of LAGEVRIO. Talk to your healthcare provider about reliable birth control methods. . Before starting treatment with [facility] your healthcare provider may do a pregnancy test to see if you are pregnant before starting treatment with LAGEVRIO. . Tell your healthcare provider  right away if you become pregnant or think you may be pregnant during treatment with LAGEVRIO. Pregnancy Surveillance Program: . There is a pregnancy surveillance program for individuals who take LAGEVRIO during pregnancy. The purpose of this program is to collect information about the health of you and your baby. Talk to your healthcare provider about how to take part in this program. . If you take LAGEVRIO during pregnancy and you agree to participate in the pregnancy surveillance program and allow your healthcare provider to share your information with [place], then your healthcare provider will report your use of [place] during pregnancy to [place]. by calling [phone] or [email or web address]. For individuals who are sexually active with partners who are able to become pregnant: . It is not known if LAGEVRIO can affect sperm. While the risk is regarded as low, animal studies to fully assess the potential for LAGEVRIO to affect the babies of males treated with LAGEVRIO have not been completed. A reliable method of birth control (contraception) should be used consistently and correctly during treatment with LAGEVRIO and for at least 3 months after the last dose. The risk to sperm beyond 3 months is not known. Studies to understand the risk to sperm beyond 3 months are ongoing. Talk to your healthcare provider about reliable birth control methods. Talk to your healthcare provider if you have questions or concerns about how LAGEVRIO may affect sperm. You are being given this fact sheet because your healthcare provider believes it is necessary to provide you with LAGEVRIO for the treatment of adults with mild-to-moderate coronavirus disease 2019 (COVID-19) with positive results of direct SARS-CoV-2 viral testing, and who are at high risk for progression to severe COVID-19 including hospitalization or death, and for whom other COVID-19 treatment options  approved or authorized by the FDA are not accessible or clinically appropriate. The U.S. Food and Drug Administration (FDA) has issued an Emergency Use Authorization (EUA) to make LAGEVRIO available during the COVID-19 pandemic (for more details about an EUA please see "What is an Emergency Use Authorization?" at the end of this document). LAGEVRIO is not an FDA-approved medicine in the [place]. Read this Fact Sheet for information about LAGEVRIO. Talk to your healthcare provider about your options if you have any questions. It is your choice to take LAGEVRIO.  What is COVID-19? COVID-19 is caused by a virus called a coronavirus. You can get COVID-19 through close contact with another person who has the virus. COVID-19 illnesses have ranged from very mild-to-severe, including illness resulting in death. While information so far suggests that most COVID-19 illness is mild, serious illness can happen and may cause some of your other medical conditions to become worse. Older people and people of all ages with severe, long lasting (chronic) medical conditions like heart disease, lung disease and diabetes, for example seem to be at higher risk of being hospitalized for COVID-19.  What is LAGEVRIO?  LAGEVRIO is an investigational medicine used to treat mild-to-moderate COVID-19 in adults: . with positive results of direct SARS-CoV-2 viral testing, and . who are at high risk for progression to severe COVID-19 including hospitalization or death, and for whom other COVID-19 treatment options approved or authorized by the FDA are not accessible or clinically appropriate. The FDA has authorized the emergency use of LAGEVRIO for the treatment of mild-tomoderate COVID-19 in adults under an EUA. For more information on EUA, see the "What is an Emergency Use Authorization (EUA)?" section at the end of this Fact Sheet. LAGEVRIO is not authorized: . for use in people less than 30 years of age. . for  prevention of COVID-19. . for people needing hospitalization for COVID-19. . for use for longer than 5 consecutive days.  What should I tell my healthcare provider before I take LAGEVRIO? Tell your healthcare provider if you: . Have any allergies . Are breastfeeding or plan to breastfeed . Have any serious illnesses . Are taking any medicines (prescription, over-the-counter, vitamins, or herbal products).  How do I take LAGEVRIO? Marland Kitchen Take LAGEVRIO exactly as your healthcare provider tells you to take it. . Take 4 capsules of LAGEVRIO every 12 hours (for example, at 8 am and at 8 pm) . Take LAGEVRIO for 5 days. It is important that you complete the full 5 days of treatment with LAGEVRIO. Do not stop taking LAGEVRIO before you complete the full 5 days of treatment, even if you feel better. . Take LAGEVRIO with or without food. . You should stay in isolation for as long as your healthcare provider tells you to. Talk to your healthcare provider if you are not sure about how to properly isolate while you have COVID-19. Marland Kitchen Swallow LAGEVRIO capsules whole. Do not open, break, or crush the capsules. If you cannot swallow capsules whole, tell your healthcare provider. . What to do if you miss a dose: o If it has been less than 10 hours since the missed dose, take it as soon as you remember o If it has been more than 10 hours since the missed dose, skip the missed dose and take your dose at the next scheduled time. . Do not double the dose of LAGEVRIO to make up for a missed dose.  What are the important possible side effects of LAGEVRIO? . See, "What is the most important information I should know about LAGEVRIO?" . Allergic Reactions. Allergic reactions can happen in people taking LAGEVRIO, even after only 1 dose. Stop taking LAGEVRIO and call your healthcare provider right away if you get any of the following symptoms of an allergic reaction: o hives o rapid heartbeat o trouble swallowing  or breathing o swelling of the mouth, lips, or face o throat tightness o hoarseness o skin rash The most common side effects of LAGEVRIO are: . diarrhea . nausea . dizziness These are not all the possible side effects of LAGEVRIO. Not many people have taken LAGEVRIO. Serious and unexpected side effects may happen. This medicine is still being studied, so it is possible that all of the risks are not known at this time.  What other treatment choices are there?  Veklury (remdesivir) is FDA-approved as an intravenous (IV) infusion for the treatment of mildto-moderate LNLGX-21 in certain adults and children. Talk with your doctor to see if Marijean Heath is appropriate for you. Like LAGEVRIO, FDA may also allow for the emergency use of other medicines to treat people with COVID-19. Go to LacrosseProperties.si for more  information. It is your choice to be treated or not to be treated with LAGEVRIO. Should you decide not to take it, it will not change your standard medical care.  What if I am breastfeeding? Breastfeeding is not recommended during treatment with LAGEVRIO and for 4 days after the last dose of LAGEVRIO. If you are breastfeeding or plan to breastfeed, talk to your healthcare provider about your options and specific situation before taking LAGEVRIO.  How do I report side effects with LAGEVRIO? Contact your healthcare provider if you have any side effects that bother you or do not go away. Report side effects to FDA MedWatch at SmoothHits.hu or call 1-800-FDA-1088 (1- 9180693498).  How should I store [place]? Marland Kitchen Store LAGEVRIO capsules at room temperature between 40F to 25F (20C to 25C). Marland Kitchen Keep LAGEVRIO and all medicines out of the reach of children and pets. How can I learn more about COVID-19? Marland Kitchen Ask your healthcare provider. . Visit SeekRooms.[email or web address] . Contact your  local or state public health department. . Call [place] at [phone] (toll free in the [place] . Visit [email or web address].com  What Is an Emergency Use Authorization (EUA)? The [place] FDA has made [place] available under an emergency access mechanism called an Emergency Use Authorization (EUA) The EUA is supported by a Presenter, broadcasting Health and Human Service (HHS) declaration that circumstances exist to justify emergency use of drugs and biological products during the COVID-19 pandemic. LAGEVRIO for the treatment of mild-to-moderate COVID-19 in adults with positive results of direct SARS-CoV-2 viral testing, who are at high risk for progression to severe COVID-19, including hospitalization or death, and for whom alternative COVID-19 treatment options approved or authorized by FDA are not accessible or clinically appropriate, has not undergone the same type of review as an FDA-approved product. In issuing an EUA under the MBWGY-65 public health emergency, the FDA has determined, among other things, that based on the total amount of scientific evidence available including data from adequate and well-controlled clinical trials, if available, it is reasonable to believe that the product may be effective for diagnosing, treating, or preventing COVID-19, or a serious or life-threatening disease or condition caused by COVID-19; that the known and potential benefits of the product, when used to diagnose, treat, or prevent such disease or condition, outweigh the known and potential risks of such product; and that there are no adequate, approved, and available alternatives.  All of these criteria must be met to allow for the product to be used in the treatment of patients during the COVID-19 pandemic. The EUA for LAGEVRIO is in effect for the duration of the COVID-19 declaration justifying emergency use of LAGEVRIO, unless terminated or revoked (after which LAGEVRIO may no longer be  used under the EUA). For patent information: http://rogers.info/ Copyright  2021-2022 [place] [place] and its affiliates. All rights reserved. [id number] Revised: March 2022

## 2020-12-16 ENCOUNTER — Other Ambulatory Visit: Payer: Self-pay | Admitting: Physician Assistant

## 2021-01-01 ENCOUNTER — Other Ambulatory Visit: Payer: Self-pay | Admitting: Family Medicine

## 2021-01-01 DIAGNOSIS — J45909 Unspecified asthma, uncomplicated: Secondary | ICD-10-CM

## 2021-01-17 ENCOUNTER — Ambulatory Visit: Payer: PPO | Admitting: Obstetrics and Gynecology

## 2021-02-12 NOTE — Progress Notes (Signed)
70 y.o. G67P2002 Married White or Caucasian Not Hispanic or Latino female here for annual exam. Patient would like talk about her weaning Effexor.  She is on Effexor and Gabapentin for vasomotor symptoms, symptoms have improved.   H/O TLH/BSO/rectocele repair in 12/19. Not sexually active, fine with it.   TC risk of breast cancer is 23.4%  Has bad arthritis and degenerative disc disease    Patient's last menstrual period was 09/23/2012.          Sexually active: No.  The current method of family planning is post menopausal status.    Exercising: No.  The patient does not participate in regular exercise at present. Smoker:  no  Health Maintenance: Pap:  11/21/2017 WNL NEG HPV History of abnormal Pap:  no MMG:  05/30/20 density C Bi-rads 1 neg  Breast MRI in 8/20 was normal BMD:   04/06/19 Osteopenia, t score -1.7, FRAX 10/1.4% Colonoscopy: 01/22/19 f/u 5-10 years TDaP: 2007 per Epic   Gardasil: NA   reports that she has never smoked. She has never used smokeless tobacco. She reports current alcohol use of about 8.0 standard drinks of alcohol per week. She reports that she does not use drugs. Retired, Warden/ranger. 41 year old granddaughter. Daughter is pregnant.   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   OA (osteoarthritis)    Osteopenia    PONV (postoperative nausea and vomiting)    Raynauds syndrome    Seasonal affective disorder (HCC)    SUI (stress urinary incontinence, female)    Superficial thrombophlebitis    x2  left lower leg superficial in  2006  prior to vein stripping     Past Surgical History:  Procedure Laterality Date   CYSTOSCOPY N/A 07/14/2018   Procedure: CYSTOSCOPY;  Surgeon: Salvadore Dom, MD;   Location: [facility];  Service: Gynecology;  Laterality: N/A;   D & C HYSTEROSCOPY W/ RESECTION FIBROID  09-21-2002  dr Mohammed Kindle  [facility]   D & C HYSTEROSCOPY W/ RESECTION POLYP  12-24-2010  dr Joan Flores  [facility]   DILATION AND CURETTAGE OF UTERUS     ESOPHAGEAL DILATION  2013  --- [facility]   Omega dilation for Achalasia   ESOPHAGOGASTRODUODENOSCOPY N/A 09/20/2017   Procedure: ESOPHAGOGASTRODUODENOSCOPY (EGD);  Surgeon: Clarene Essex, MD;  Location: [facility];  Service: Endoscopy;  Laterality: N/A;   HELLER MYOTOMY  123456   fundoplication repair hiatal hernia   KNEE ARTHROSCOPY Left 2006 &  09-27-2003   KNEE ARTHROSCOPY W/ SYNOVECTOMY Right 09-20-2009  dr Wynelle Link  [place] N/A 07/14/2018   Procedure: POSTERIOR REPAIR (RECTOCELE);  Surgeon: Salvadore Dom, MD;  Location: [facility];  Service: Gynecology;  Laterality: N/A;   ROTATOR CUFF REPAIR Right 09/2011   TONSILLECTOMY AND ADENOIDECTOMY  1961   TOTAL KNEE ARTHROPLASTY Bilateral left 02-05-2006 ;  right 08-14-2007   dr Wynelle Link  [facility]   TOTAL KNEE REVISION Right 11/26/2017   Procedure: Right knee polyethylene revision;  Surgeon: Gaynelle Arabian, MD;  Location: [facility];  Service: Orthopedics;  Laterality: Right;  Adductor Block   VARICOSE VEIN SURGERY  09/2005    Current Outpatient Medications  Medication Sig Dispense Refill   benzonatate (TESSALON PERLES) 100 MG capsule Take 1 capsule (100 mg total) by mouth 3 (three) times daily as needed. 20 capsule 0   buPROPion (WELLBUTRIN XL) 150 MG 24 hr tablet TAKE 1 TABLET BY MOUTH EVERY DAY 90 tablet 0   CALCIUM PO Take 15 mLs by mouth daily.     Cholecalciferol (VITAMIN D3) 1000 units CAPS Take 1,000 Units by mouth daily.     diclofenac Sodium (VOLTAREN) 1 % GEL Voltaren 1 % topical gel     docusate sodium (COLACE) 100 MG capsule Take 100 mg by mouth 2 (two) times daily as needed for mild constipation.     esomeprazole (NEXIUM) 40 MG  capsule Take 40 mg by mouth See admin instructions. Take 40 mg by mouth daily. May take additional 40 mg if needed for acid reflux     fluticasone-salmeterol (ADVAIR DISKUS) 250-50 MCG/ACT AEPB TAKE 1 PUFF BY MOUTH TWICE A DAY 180 each 0   gabapentin (NEURONTIN) 100 MG capsule TAKE 3 CAPSULE BY MOUTH AT BEDTIME and one capsule in the am. 360 capsule 3   hydrochlorothiazide (HYDRODIURIL) 12.5 MG tablet TAKE 1 TABLET BY MOUTH EVERY DAY 90 tablet 1   venlafaxine XR (EFFEXOR-XR) 37.5 MG 24 hr capsule TAKE 1 CAPSULE BY MOUTH EVERY DAY WITH BREAKFAST 90 capsule 3   No current facility-administered medications for this visit.    Family History  Problem Relation Age of Onset   Hypertension Father    Heart disease Father    Stroke Father    Cancer Father        Testicular, Bladder, Prostate, Lung   Heart disease Mother    Stroke Mother    Osteoporosis Mother    Uterine cancer Sister 73   Breast cancer Sister    Cancer Sister        rectal, liver,lymph nodes   Diabetes Maternal Grandfather    Heart disease Maternal Grandfather    Diabetes Paternal Grandmother    Breast cancer Maternal Aunt     Review of Systems  All other systems reviewed and are negative. She has had a productive cough, light yellow. Some wheezing.  Exam:   BP 130/62   Pulse 77   Ht '5\' 3"'$  (1.6 m)   Wt 159 lb (72.1 kg)   LMP 09/23/2012   PF 100 L/min   BMI 28.17 kg/m   Weight change: '@WEIGHTCHANGE'$ @ Height:   Height: '5\' 3"'$  (160 cm)  Ht Readings from Last 3 Encounters:  02/13/21 '5\' 3"'$  (1.6 m)  05/11/20 5' 2.5" (1.588 m)  01/10/20 '5\' 3"'$  (1.6 m)    General appearance: alert, cooperative and appears stated age Head: Normocephalic, without obvious abnormality, atraumatic Neck: no adenopathy, supple, symmetrical, trachea midline and thyroid normal to inspection and palpation Lungs: clear to auscultation bilaterally Cardiovascular: regular rate and rhythm Breasts: normal appearance, no masses or  tenderness Abdomen: soft, non-tender; non distended,  no masses,  no organomegaly Extremities: extremities normal, atraumatic, no cyanosis or edema Skin: Skin color, texture, turgor normal. No rashes or lesions Lymph nodes: Cervical, supraclavicular, and axillary nodes normal. No abnormal inguinal nodes palpated Neurologic: Grossly normal   Pelvic: External genitalia:  no lesions              Urethra:  normal appearing urethra with no masses, tenderness or lesions              Bartholins and Skenes:  normal                 Vagina: normal appearing vagina with normal color and discharge, no lesions              Cervix: absent               Bimanual Exam:  Uterus:  uterus absent              Adnexa: no mass, fullness, tenderness               Rectovaginal: Confirms               Anus:  normal sphincter tone, no lesions  Gae Dry chaperoned for the exam.  1. Well woman exam No pap this year Labs with her primary Colonoscopy UTD  2. History of osteopenia Discussed calcium and vit d - DG Bone Density; Future  3. S/P laparoscopic hysterectomy Doing well  4. Menopausal vasomotor syndrome Will wean off of effexor. She will take it every other day for 2 weeks. If having problems will put her on prozac short term to help her wean. - gabapentin (NEURONTIN) 100 MG capsule; TAKE 3 CAPSULE BY MOUTH AT BEDTIME and one capsule in the am.  Dispense: 360 capsule; Refill: 3  5. Increased risk of breast cancer Mammogram in 11/22 Breast MRI ordered (discussed contrast deposits)

## 2021-02-13 ENCOUNTER — Ambulatory Visit (INDEPENDENT_AMBULATORY_CARE_PROVIDER_SITE_OTHER): Payer: PPO | Admitting: Obstetrics and Gynecology

## 2021-02-13 ENCOUNTER — Encounter: Payer: Self-pay | Admitting: Obstetrics and Gynecology

## 2021-02-13 ENCOUNTER — Other Ambulatory Visit: Payer: Self-pay

## 2021-02-13 ENCOUNTER — Other Ambulatory Visit: Payer: Self-pay | Admitting: Obstetrics and Gynecology

## 2021-02-13 VITALS — BP 130/62 | HR 77 | Ht 63.0 in | Wt 159.0 lb

## 2021-02-13 DIAGNOSIS — Z90710 Acquired absence of both cervix and uterus: Secondary | ICD-10-CM | POA: Diagnosis not present

## 2021-02-13 DIAGNOSIS — N951 Menopausal and female climacteric states: Secondary | ICD-10-CM | POA: Diagnosis not present

## 2021-02-13 DIAGNOSIS — Z8739 Personal history of other diseases of the musculoskeletal system and connective tissue: Secondary | ICD-10-CM

## 2021-02-13 DIAGNOSIS — Z9189 Other specified personal risk factors, not elsewhere classified: Secondary | ICD-10-CM

## 2021-02-13 DIAGNOSIS — Z01419 Encounter for gynecological examination (general) (routine) without abnormal findings: Secondary | ICD-10-CM | POA: Diagnosis not present

## 2021-02-13 MED ORDER — GABAPENTIN 100 MG PO CAPS: ORAL_CAPSULE | ORAL | 3 refills | Status: DC

## 2021-02-13 NOTE — Patient Instructions (Signed)

## 2021-02-13 NOTE — Telephone Encounter (Signed)
Refill request for Effexor. Annual exam today 02/13/2021. I will route to provider for approval/denial.

## 2021-02-19 ENCOUNTER — Telehealth: Payer: Self-pay | Admitting: *Deleted

## 2021-02-19 DIAGNOSIS — Z9189 Other specified personal risk factors, not elsewhere classified: Secondary | ICD-10-CM

## 2021-02-19 NOTE — Telephone Encounter (Signed)
Order placed at [facility] message to call to provide patient with number [phone] to call and schedule.

## 2021-02-19 NOTE — Telephone Encounter (Signed)
-----   Message from Salvadore Dom, MD sent at 02/13/2021  9:40 AM EDT ----- Please schedule this patient for a breast MRI for increased risk of breast cancer. Thanks, Sharee Pimple

## 2021-03-01 ENCOUNTER — Other Ambulatory Visit: Payer: Self-pay | Admitting: Physician Assistant

## 2021-03-13 NOTE — Telephone Encounter (Signed)
[facility] called to schedule, patient said she will call back to schedule.

## 2021-03-19 ENCOUNTER — Other Ambulatory Visit: Payer: Self-pay | Admitting: Family Medicine

## 2021-03-20 NOTE — Telephone Encounter (Signed)
[facility] called to schedule, however patient said she will call back.

## 2021-03-23 ENCOUNTER — Other Ambulatory Visit: Payer: Self-pay | Admitting: Obstetrics and Gynecology

## 2021-03-23 ENCOUNTER — Telehealth: Payer: Self-pay | Admitting: *Deleted

## 2021-03-23 DIAGNOSIS — N951 Menopausal and female climacteric states: Secondary | ICD-10-CM

## 2021-03-23 DIAGNOSIS — Z8739 Personal history of other diseases of the musculoskeletal system and connective tissue: Secondary | ICD-10-CM

## 2021-03-23 MED ORDER — VENLAFAXINE HCL ER 37.5 MG PO CP24: ORAL_CAPSULE | ORAL | 0 refills | Status: DC

## 2021-03-23 NOTE — Telephone Encounter (Signed)
Patient had annual exam on 02/13/21 discuss weaning on efferxor-xl  37.5 mg capsule,patient states you asked her if she needed any refills during the weaning process she said not at the time, she thought she had enough. Patient called today stating she has been weaning for about 2 weeks now and she does not have enough. Asked if you could send in refill? Please advise

## 2021-03-23 NOTE — Telephone Encounter (Signed)
Please let her know that I sent in a 30 day supply of Effexor.

## 2021-03-26 NOTE — Telephone Encounter (Signed)
Patient scheduled on 04/12/21 for breast MRI. Will route to [facility] to get prior approval.

## 2021-04-12 ENCOUNTER — Ambulatory Visit
Admission: RE | Admit: 2021-04-12 | Discharge: 2021-04-12 | Disposition: A | Discharge status: Home / Self Care | Payer: PPO | Source: Ambulatory Visit | Attending: Obstetrics and Gynecology | Admitting: Obstetrics and Gynecology

## 2021-04-12 ENCOUNTER — Other Ambulatory Visit: Payer: Self-pay

## 2021-04-12 DIAGNOSIS — Z1239 Encounter for other screening for malignant neoplasm of breast: Secondary | ICD-10-CM | POA: Diagnosis not present

## 2021-04-12 DIAGNOSIS — Z9189 Other specified personal risk factors, not elsewhere classified: Secondary | ICD-10-CM

## 2021-04-12 MED ORDER — GADOBUTROL 1 MMOL/ML IV SOLN
7.0000 mL | Freq: Once | INTRAVENOUS | Status: AC | PRN
  Administered 2021-04-12: 7 mL via INTRAVENOUS

## 2021-04-14 ENCOUNTER — Other Ambulatory Visit: Payer: Self-pay | Admitting: Obstetrics and Gynecology

## 2021-04-14 DIAGNOSIS — N951 Menopausal and female climacteric states: Secondary | ICD-10-CM

## 2021-05-09 ENCOUNTER — Telehealth: Payer: Self-pay | Admitting: *Deleted

## 2021-05-09 DIAGNOSIS — N951 Menopausal and female climacteric states: Secondary | ICD-10-CM

## 2021-05-09 MED ORDER — VENLAFAXINE HCL ER 37.5 MG PO CP24: ORAL_CAPSULE | ORAL | 3 refills | Status: DC

## 2021-05-09 NOTE — Telephone Encounter (Signed)
Effexor refill sent.

## 2021-05-09 NOTE — Telephone Encounter (Signed)
Patient called has been off Effexor -XR 37.5 mg tablet reports having hot flashes every 25 minutes. She would like to start back on Rx. Annual exam was on 02/2021. Please advise

## 2021-05-10 NOTE — Telephone Encounter (Signed)
Patient informed. 

## 2021-05-29 ENCOUNTER — Other Ambulatory Visit: Payer: Self-pay | Admitting: Physician Assistant

## 2021-08-24 ENCOUNTER — Other Ambulatory Visit: Payer: Self-pay | Admitting: Physician Assistant

## 2021-08-28 DIAGNOSIS — D485 Neoplasm of uncertain behavior of skin: Secondary | ICD-10-CM | POA: Diagnosis not present

## 2021-08-28 DIAGNOSIS — L821 Other seborrheic keratosis: Secondary | ICD-10-CM | POA: Diagnosis not present

## 2021-08-28 DIAGNOSIS — C44311 Basal cell carcinoma of skin of nose: Secondary | ICD-10-CM | POA: Diagnosis not present

## 2021-08-28 DIAGNOSIS — L570 Actinic keratosis: Secondary | ICD-10-CM | POA: Diagnosis not present

## 2021-08-28 DIAGNOSIS — L308 Other specified dermatitis: Secondary | ICD-10-CM | POA: Diagnosis not present

## 2021-08-28 DIAGNOSIS — L920 Granuloma annulare: Secondary | ICD-10-CM | POA: Diagnosis not present

## 2021-08-31 ENCOUNTER — Ambulatory Visit
Admission: RE | Admit: 2021-08-31 | Discharge: 2021-08-31 | Disposition: A | Discharge status: Home / Self Care | Payer: PPO | Source: Ambulatory Visit | Attending: Obstetrics and Gynecology | Admitting: Obstetrics and Gynecology

## 2021-08-31 DIAGNOSIS — M85852 Other specified disorders of bone density and structure, left thigh: Secondary | ICD-10-CM | POA: Diagnosis not present

## 2021-08-31 DIAGNOSIS — Z8739 Personal history of other diseases of the musculoskeletal system and connective tissue: Secondary | ICD-10-CM

## 2021-08-31 DIAGNOSIS — Z780 Asymptomatic menopausal state: Secondary | ICD-10-CM | POA: Diagnosis not present

## 2021-09-20 ENCOUNTER — Other Ambulatory Visit: Payer: Self-pay | Admitting: Physician Assistant

## 2021-10-02 DIAGNOSIS — Z85828 Personal history of other malignant neoplasm of skin: Secondary | ICD-10-CM | POA: Diagnosis not present

## 2021-10-02 DIAGNOSIS — C44311 Basal cell carcinoma of skin of nose: Secondary | ICD-10-CM | POA: Diagnosis not present

## 2021-10-16 DIAGNOSIS — H02831 Dermatochalasis of right upper eyelid: Secondary | ICD-10-CM | POA: Diagnosis not present

## 2021-10-16 DIAGNOSIS — H02834 Dermatochalasis of left upper eyelid: Secondary | ICD-10-CM | POA: Diagnosis not present

## 2021-10-16 DIAGNOSIS — H2513 Age-related nuclear cataract, bilateral: Secondary | ICD-10-CM | POA: Diagnosis not present

## 2021-11-27 ENCOUNTER — Other Ambulatory Visit: Payer: Self-pay | Admitting: Physician Assistant

## 2021-11-28 DIAGNOSIS — D2271 Melanocytic nevi of right lower limb, including hip: Secondary | ICD-10-CM | POA: Diagnosis not present

## 2021-11-28 DIAGNOSIS — L718 Other rosacea: Secondary | ICD-10-CM | POA: Diagnosis not present

## 2021-11-28 DIAGNOSIS — D2371 Other benign neoplasm of skin of right lower limb, including hip: Secondary | ICD-10-CM | POA: Diagnosis not present

## 2021-11-28 DIAGNOSIS — Z85828 Personal history of other malignant neoplasm of skin: Secondary | ICD-10-CM | POA: Diagnosis not present

## 2021-11-28 DIAGNOSIS — L821 Other seborrheic keratosis: Secondary | ICD-10-CM | POA: Diagnosis not present

## 2021-11-28 DIAGNOSIS — D225 Melanocytic nevi of trunk: Secondary | ICD-10-CM | POA: Diagnosis not present

## 2021-11-28 DIAGNOSIS — D2261 Melanocytic nevi of right upper limb, including shoulder: Secondary | ICD-10-CM | POA: Diagnosis not present

## 2021-11-28 DIAGNOSIS — B353 Tinea pedis: Secondary | ICD-10-CM | POA: Diagnosis not present

## 2021-11-28 DIAGNOSIS — D2372 Other benign neoplasm of skin of left lower limb, including hip: Secondary | ICD-10-CM | POA: Diagnosis not present

## 2021-11-28 DIAGNOSIS — L920 Granuloma annulare: Secondary | ICD-10-CM | POA: Diagnosis not present

## 2021-11-28 DIAGNOSIS — L2089 Other atopic dermatitis: Secondary | ICD-10-CM | POA: Diagnosis not present

## 2021-12-19 ENCOUNTER — Other Ambulatory Visit: Payer: Self-pay | Admitting: Physician Assistant

## 2021-12-31 ENCOUNTER — Other Ambulatory Visit: Payer: Self-pay | Admitting: Physician Assistant

## 2022-01-10 ENCOUNTER — Telehealth: Payer: Self-pay | Admitting: Physician Assistant

## 2022-01-10 MED ORDER — BUPROPION HCL ER (XL) 150 MG PO TB24: 150.0000 mg | ORAL_TABLET | Freq: Every day | ORAL | 0 refills | Status: DC

## 2022-01-10 NOTE — Telephone Encounter (Signed)
Pt requests refill to get her to the next appointment (07/14)    LAST APPOINTMENT DATE:   06/24/19 - OV with SW  NEXT APPOINTMENT DATE: 01/25/22 - CPE  MEDICATION: buPROPion (WELLBUTRIN XL) 150 MG 24 hr tablet [[id number]]    Is the patient out of medication?  Yes.   PHARMACY[id number] [place] [place] - [address]  [id number] Phone:  [phone] Fax:  [phone]

## 2022-01-10 NOTE — Telephone Encounter (Signed)
Left detailed message on personal voicemail Rx for Wellbutrin was sent to pharmacy enough till your appt. Any questions call the office.

## 2022-01-12 HISTORY — PX: OTHER SURGICAL HISTORY: SHX169

## 2022-01-18 ENCOUNTER — Other Ambulatory Visit: Payer: Self-pay | Admitting: Physician Assistant

## 2022-01-25 ENCOUNTER — Encounter: Payer: Self-pay | Admitting: Physician Assistant

## 2022-01-25 ENCOUNTER — Ambulatory Visit (INDEPENDENT_AMBULATORY_CARE_PROVIDER_SITE_OTHER): Payer: PPO | Admitting: Physician Assistant

## 2022-01-25 VITALS — BP 130/80 | HR 73 | Temp 97.3°F | Ht 63.0 in | Wt 157.0 lb

## 2022-01-25 DIAGNOSIS — R8290 Unspecified abnormal findings in urine: Secondary | ICD-10-CM | POA: Diagnosis not present

## 2022-01-25 DIAGNOSIS — Z0001 Encounter for general adult medical examination with abnormal findings: Secondary | ICD-10-CM

## 2022-01-25 DIAGNOSIS — E785 Hyperlipidemia, unspecified: Secondary | ICD-10-CM | POA: Diagnosis not present

## 2022-01-25 DIAGNOSIS — K220 Achalasia of cardia: Secondary | ICD-10-CM | POA: Diagnosis not present

## 2022-01-25 LAB — URINALYSIS, ROUTINE W REFLEX MICROSCOPIC
Bilirubin Urine: NEGATIVE
Hgb urine dipstick: NEGATIVE
Ketones, ur: NEGATIVE
Leukocytes,Ua: NEGATIVE
Nitrite: NEGATIVE
Specific Gravity, Urine: 1.01 (ref 1.000–1.030)
Total Protein, Urine: NEGATIVE
Urine Glucose: NEGATIVE
Urobilinogen, UA: 0.2 (ref 0.0–1.0)
pH: 7 (ref 5.0–8.0)

## 2022-01-25 LAB — CBC WITH DIFFERENTIAL/PLATELET
Basophils Absolute: 0.1 10*3/uL (ref 0.0–0.1)
Basophils Relative: 1.7 % (ref 0.0–3.0)
Eosinophils Absolute: 0.2 10*3/uL (ref 0.0–0.7)
Eosinophils Relative: 3.9 % (ref 0.0–5.0)
HCT: 43.5 % (ref 36.0–46.0)
Hemoglobin: 14.6 g/dL (ref 12.0–15.0)
Lymphocytes Relative: 36.1 % (ref 12.0–46.0)
Lymphs Abs: 1.6 10*3/uL (ref 0.7–4.0)
MCHC: 33.5 g/dL (ref 30.0–36.0)
MCV: 92.2 fl (ref 78.0–100.0)
Monocytes Absolute: 0.4 10*3/uL (ref 0.1–1.0)
Monocytes Relative: 8.2 % (ref 3.0–12.0)
Neutro Abs: 2.2 10*3/uL (ref 1.4–7.7)
Neutrophils Relative %: 50.1 % (ref 43.0–77.0)
Platelets: 184 10*3/uL (ref 150.0–400.0)
RBC: 4.73 Mil/uL (ref 3.87–5.11)
RDW: 13.1 % (ref 11.5–15.5)
WBC: 4.3 10*3/uL (ref 4.0–10.5)

## 2022-01-25 LAB — COMPREHENSIVE METABOLIC PANEL
ALT: 14 U/L (ref 0–35)
AST: 14 U/L (ref 0–37)
Albumin: 4.4 g/dL (ref 3.5–5.2)
Alkaline Phosphatase: 53 U/L (ref 39–117)
BUN: 15 mg/dL (ref 6–23)
CO2: 32 mEq/L (ref 19–32)
Calcium: 10.3 mg/dL (ref 8.4–10.5)
Chloride: 99 mEq/L (ref 96–112)
Creatinine, Ser: 0.89 mg/dL (ref 0.40–1.20)
GFR: 66 mL/min (ref >60.00)
Glucose, Bld: 91 mg/dL (ref 70–99)
Potassium: 4.1 mEq/L (ref 3.5–5.1)
Sodium: 138 mEq/L (ref 135–145)
Total Bilirubin: 0.7 mg/dL (ref 0.2–1.2)
Total Protein: 6.9 g/dL (ref 6.0–8.3)

## 2022-01-25 LAB — LIPID PANEL
Cholesterol: 276 mg/dL — ABNORMAL HIGH (ref 0–200)
HDL: 77 mg/dL (ref >39.00)
LDL Cholesterol: 176 mg/dL — ABNORMAL HIGH (ref 0–99)
NonHDL: 198.54
Total CHOL/HDL Ratio: 4
Triglycerides: 112 mg/dL (ref 0.0–149.0)
VLDL: 22.4 mg/dL (ref 0.0–40.0)

## 2022-01-25 MED ORDER — EPINEPHRINE 0.3 MG/0.3ML IJ SOAJ: 0.3000 mg | INTRAMUSCULAR | 1 refills | Status: DC | PRN

## 2022-01-25 MED ORDER — HYDROCHLOROTHIAZIDE 25 MG PO TABS: 25.0000 mg | ORAL_TABLET | Freq: Every day | ORAL | 3 refills | Status: DC

## 2022-01-25 NOTE — Progress Notes (Signed)
Subjective:    Beth Peters is a 71 y.o. female and is here for a comprehensive physical exam.   HPI  There are no preventive care reminders to display for this patient.  Acute Concerns: Abnormal urine Patient has bubbles in her urine. This is not new for her, but she is paying more attention to things and wants it evaluated; parents with kidney disease. No pain with urination. No blood in urine. No personal hx of kidney stones.   Achalasia Has aspiration hx and wants to do a CXR to evaluate this. Denies: fevers, chills, severe cough, SOB. Need  Chronic Issues: HTN Currently taking HCTZ  12.5 mg. At home blood pressure readings are: around 140/80. Patient denies chest pain, SOB, blurred vision, dizziness, unusual headaches, lower leg swelling. Patient is compliant with medication. Denies excessive caffeine intake, stimulant usage, excessive alcohol intake, or increase in salt consumption. She is having increased swelling in her hands and is interested in increasing her dosage.  BP Readings from Last 3 Encounters:  01/25/22 130/80  02/13/21 130/62  05/11/20 140/64   HLD She would like evaluation of her carotids and CT score to evaluate further. Needs updated lipid panel as well.  Health Maintenance: Immunizations -- UTD Colonoscopy -- UTD Mammogram -- UTD per patient PAP -- n/a Bone Density -- UTD Diet -- doing well Sleep habits -- no concerns Exercise -- getting back into things Weight -- Weight: 157 lb (71.2 kg)  Mood -- denies concerns Weight history: Wt Readings from Last 10 Encounters:  01/25/22 157 lb (71.2 kg)  02/13/21 159 lb (72.1 kg)  05/11/20 160 lb (72.6 kg)  01/10/20 158 lb (71.7 kg)  12/17/19 152 lb (68.9 kg)  10/28/19 156 lb 9.6 oz (71 kg)  07/01/19 164 lb (74.4 kg)  06/24/19 160 lb (72.6 kg)  06/01/19 163 lb 4 oz (74 kg)  01/07/19 161 lb 12.8 oz (73.4 kg)   Body mass index is 27.81 kg/m. Patient's last menstrual period was  09/23/2012. Alcohol use:  reports current alcohol use of about 8.0 standard drinks of alcohol per week. Tobacco use:  Tobacco Use: Low Risk  (01/25/2022)   Patient History    Smoking Tobacco Use: Never    Smokeless Tobacco Use: Never    Passive Exposure: Not on file        10/28/2019    3:09 PM  Depression screen PHQ 2/9  Decreased Interest 1  Down, Depressed, Hopeless 1  PHQ - 2 Score 2  Altered sleeping 0  Tired, decreased energy 1  Change in appetite 0  Feeling bad or failure about yourself  0  Trouble concentrating 1  Moving slowly or fidgety/restless 0  PHQ-9 Score 4  Difficult doing work/chores Not difficult at all     Other providers/specialists: Patient Care Team: Inda Coke, [place] as PCP - General (Physician Assistant) Gaynelle Arabian, MD as Consulting Physician (Orthopedic Surgery) Levy Pupa, PA-C as Physician Assistant (Chiropractic Medicine) Salvadore Dom, MD as Consulting Physician (Obstetrics and Gynecology) Ronald Lobo, MD as Consulting Physician (Gastroenterology) Suella Broad, MD as Consulting Physician (Physical Medicine and Rehabilitation) Melina Schools, MD as Consulting Physician (Orthopedic Surgery) Kristeen Miss, MD as Consulting Physician (Neurosurgery) Izora Gala, MD as Consulting Physician (Otolaryngology) Dohmeier, Asencion Partridge, MD as Consulting Physician (Neurology)    PMHx, SurgHx, SocialHx, Medications, and Allergies were reviewed in the Visit Navigator and updated as appropriate.   Past Medical History:  Diagnosis Date   Anemia    Asthma  Chronic constipation    Cubital tunnel syndrome, bilateral    Family history of adverse reaction to anesthesia    sister and mother-- ponv   Family history of breast cancer    Lactose intolerance    Lordosis of lumbar region 8/01   OA (osteoarthritis)    Osteopenia    PONV (postoperative nausea and vomiting)    Raynauds syndrome    Seasonal affective disorder (HCC)    SUI  (stress urinary incontinence, female)    Superficial thrombophlebitis    x2  left lower leg superficial in  2006  prior to vein stripping      Past Surgical History:  Procedure Laterality Date   CYSTOSCOPY N/A 07/14/2018   Procedure: CYSTOSCOPY;  Surgeon: Salvadore Dom, MD;  Location: [facility];  Service: Gynecology;  Laterality: N/A;   D & C HYSTEROSCOPY W/ RESECTION FIBROID  09-21-2002  dr Mohammed Kindle  [facility]   D & C HYSTEROSCOPY W/ RESECTION POLYP  12-24-2010  dr Joan Flores  [facility]   DILATION AND CURETTAGE OF UTERUS     ESOPHAGEAL DILATION  2013  --- [facility]   Omega dilation for Achalasia   ESOPHAGOGASTRODUODENOSCOPY N/A 09/20/2017   Procedure: ESOPHAGOGASTRODUODENOSCOPY (EGD);  Surgeon: Clarene Essex, MD;  Location: [facility];  Service: Endoscopy;  Laterality: N/A;   HELLER MYOTOMY  4259   fundoplication repair hiatal hernia   KNEE ARTHROSCOPY Left 2006 &  09-27-2003   KNEE ARTHROSCOPY W/ SYNOVECTOMY Right 09-20-2009  dr Wynelle Link  [place] N/A 07/14/2018   Procedure: POSTERIOR REPAIR (RECTOCELE);  Surgeon: Salvadore Dom, MD;  Location: [facility];  Service: Gynecology;  Laterality: N/A;   ROTATOR CUFF REPAIR Right 09/2011   TONSILLECTOMY AND ADENOIDECTOMY  1961   TOTAL KNEE ARTHROPLASTY Bilateral left 02-05-2006 ;  right 08-14-2007   dr Wynelle Link  [facility]   TOTAL KNEE REVISION Right 11/26/2017   Procedure: Right knee polyethylene revision;  Surgeon: Gaynelle Arabian, MD;  Location: [facility];  Service: Orthopedics;  Laterality: Right;  Adductor Block   VARICOSE VEIN SURGERY  09/2005     Family History  Problem Relation Age of Onset   Hypertension Father    Heart disease Father    Stroke Father    Cancer Father        Testicular, Bladder, Prostate, Lung   Heart disease Mother    Stroke Mother    Osteoporosis Mother    Uterine cancer Sister 52   Breast cancer Sister    Cancer Sister        rectal, liver,lymph  nodes   Diabetes Maternal Grandfather    Heart disease Maternal Grandfather    Diabetes Paternal Grandmother    Breast cancer Maternal Aunt     Social History   Tobacco Use   Smoking status: Never   Smokeless tobacco: Never  Vaping Use   Vaping Use: Never used  Substance Use Topics   Alcohol use: Yes    Alcohol/week: 8.0 standard drinks of alcohol    Types: 8 Glasses of wine per week   Drug use: No    Review of Systems:   Review of Systems  Constitutional:  Negative for chills, fever, malaise/fatigue and weight loss.  HENT:  Negative for hearing loss, sinus pain and sore throat.   Respiratory:  Negative for cough and hemoptysis.   Cardiovascular:  Negative for chest pain, palpitations, leg swelling and PND.  Gastrointestinal:  Negative  for abdominal pain, constipation, diarrhea, heartburn, nausea and vomiting.  Genitourinary:  Negative for dysuria, frequency and urgency.  Musculoskeletal:  Negative for back pain, myalgias and neck pain.  Skin:  Negative for itching and rash.  Neurological:  Negative for dizziness, tingling, seizures and headaches.  Endo/Heme/Allergies:  Negative for polydipsia.  Psychiatric/Behavioral:  Negative for depression. The patient is not nervous/anxious.     Objective:   BP 130/80 (BP Location: Left Arm, Patient Position: Sitting, Cuff Size: Normal)   Pulse 73   Temp (!) 97.3 F (36.3 C) (Temporal)   Ht '5\' 3"'$  (1.6 m)   Wt 157 lb (71.2 kg)   LMP 09/23/2012   SpO2 97%   BMI 27.81 kg/m  Body mass index is 27.81 kg/m.   General Appearance:    Alert, cooperative, no distress, appears stated age  Head:    Normocephalic, without obvious abnormality, atraumatic  Eyes:    PERRL, conjunctiva/corneas clear, EOM's intact, fundi    benign, both eyes  Ears:    Normal TM's and external ear canals, both ears  Nose:   Nares normal, septum midline, mucosa normal, no drainage    or sinus tenderness  Throat:   Lips, mucosa, and tongue normal; teeth and  gums normal  Neck:   Supple, symmetrical, trachea midline, no adenopathy;    thyroid:  no enlargement/tenderness/nodules; no carotid   bruit or JVD  Back:     Symmetric, no curvature, ROM normal, no CVA tenderness  Lungs:     Clear to auscultation bilaterally, respirations unlabored  Chest Wall:    No tenderness or deformity   Heart:    Regular rate and rhythm, S1 and S2 normal, no murmur, rub or gallop  Breast Exam:    Deferred  Abdomen:     Soft, non-tender, bowel sounds active all four quadrants,    no masses, no organomegaly  Genitalia:    Deferred  Extremities:   Extremities normal, atraumatic, no cyanosis or edema  Pulses:   2+ and symmetric all extremities  Skin:   Skin color, texture, turgor normal, no rashes or lesions  Lymph nodes:   Cervical, supraclavicular, and axillary nodes normal  Neurologic:   CNII-XII intact, normal strength, sensation and reflexes    throughout    Assessment/Plan:   Encounter for general adult medical examination with abnormal findings Today patient counseled on age appropriate routine health concerns for screening and prevention, each reviewed and up to date or declined. Immunizations reviewed and up to date or declined. Labs ordered and reviewed. Risk factors for depression reviewed and negative. Hearing function and visual acuity are intact. ADLs screened and addressed as needed. Functional ability and level of safety reviewed and appropriate. Education, counseling and referrals performed based on assessed risks today. Patient provided with a copy of personalized plan for preventive services.  Hyperlipidemia, unspecified hyperlipidemia type Update lipid panel and provide recommendations accordingly. Update cardiac CT and carotid u/s per patient request  Achalasia No red flags Needs new GI provider -- I reached out to [facility] to see if they have "achalasia specialist" Update CXR per patient request -- so systemic sx concerning for PNA  Abnormal  urine Update UA and provide recommendations accordingly  Patient Counseling: '[x]'$    Nutrition: Stressed importance of moderation in sodium/caffeine intake, saturated fat and cholesterol, caloric balance, sufficient intake of fresh fruits, vegetables, fiber, calcium, iron, and 1 mg of folate supplement per day (for females capable of pregnancy).  '[x]'$    Stressed the importance  of regular exercise.   '[x]'$    Substance Abuse: Discussed cessation/primary prevention of tobacco, alcohol, or other drug use; driving or other dangerous activities under the influence; availability of treatment for abuse.   '[x]'$    Injury prevention: Discussed safety belts, safety helmets, smoke detector, smoking near bedding or upholstery.   '[x]'$    Sexuality: Discussed sexually transmitted diseases, partner selection, use of condoms, avoidance of unintended pregnancy  and contraceptive alternatives.  '[x]'$    Dental health: Discussed importance of regular tooth brushing, flossing, and dental visits.  '[x]'$    Health maintenance and immunizations reviewed. Please refer to Health maintenance section.    Inda Coke, PA-C [place]

## 2022-01-25 NOTE — Patient Instructions (Addendum)
It was great to see you!  I have ordered the carotid ultrasound and cardiac CT -- you will be contacted about this  You can get the chest xray on your own time An order for an xray has been put in for you. To get your xray, you can walk in at the [facility] location without a scheduled appointment.  The address is [address]. It is across the street from [place] is located in the basement.  Hours of operation are M-F 8:30am to 5:00pm. Please note that they are closed for lunch between 12:30 and 1:00pm.  Please go to the lab for blood work.   Our office will call you with your results unless you have chosen to receive results via MyChart.  If your blood work is normal we will follow-up each year for physicals and as scheduled for chronic medical problems.  If anything is abnormal we will treat accordingly and get you in for a follow-up.  Take care,  Aldona Bar

## 2022-01-25 NOTE — Addendum Note (Signed)
Addended by: Erlene Quan on: 01/25/2022 10:34 AM   Modules accepted: Orders

## 2022-02-04 ENCOUNTER — Telehealth: Payer: Self-pay | Admitting: Physician Assistant

## 2022-02-04 NOTE — Telephone Encounter (Signed)
Pt states she saw message on MyChart regarding recent labs.   Pt states she will follow the advice of the provider and start the lipitor as prescribed.  Pt does not need a follow up but is available for any questions.  [id number] [place], [place] - [address]  [id number] Phone:  [phone] Fax:  [phone] DEA #:  [id number]

## 2022-02-05 NOTE — Telephone Encounter (Signed)
Noted  

## 2022-02-06 ENCOUNTER — Other Ambulatory Visit: Payer: PPO

## 2022-02-06 ENCOUNTER — Ambulatory Visit (INDEPENDENT_AMBULATORY_CARE_PROVIDER_SITE_OTHER): Payer: Self-pay

## 2022-02-06 DIAGNOSIS — E785 Hyperlipidemia, unspecified: Secondary | ICD-10-CM

## 2022-02-06 DIAGNOSIS — H16223 Keratoconjunctivitis sicca, not specified as Sjogren's, bilateral: Secondary | ICD-10-CM | POA: Diagnosis not present

## 2022-02-06 DIAGNOSIS — H0015 Chalazion left lower eyelid: Secondary | ICD-10-CM | POA: Diagnosis not present

## 2022-02-28 ENCOUNTER — Ambulatory Visit (HOSPITAL_COMMUNITY)
Admission: RE | Admit: 2022-02-28 | Discharge: 2022-02-28 | Disposition: A | Discharge status: Home / Self Care | Payer: PPO | Source: Ambulatory Visit | Attending: Physician Assistant | Admitting: Physician Assistant

## 2022-02-28 DIAGNOSIS — E785 Hyperlipidemia, unspecified: Secondary | ICD-10-CM

## 2022-03-11 ENCOUNTER — Other Ambulatory Visit: Payer: Self-pay

## 2022-03-11 NOTE — Patient Instructions (Signed)
Visit Information  Thank you for taking time to visit with me today. Please don't hesitate to contact me if I can be of assistance to you.   Following are the goals we discussed today:   Goals Addressed             This Visit's Progress    COMPLETED: Care Coordination Activities - no follow up required       Care Coordination Interventions: Advised patient to call to schedule Annual Wellness Visit Provided education to patient re: Annual Wellness Visit, care coordination services Assessed social determinant of health barriers          If you are experiencing a Mental Health or Behavioral Health Crisis or need someone to talk to, please call the Suicide and Crisis Lifeline: 988 call the [place] National Suicide Prevention Lifeline: 1-800-273-8255 or TTY: 1-800-799-4 TTY (1-800-799-4889) to talk to a trained counselor call 1-800-273-TALK (toll free, 24 hour hotline) go to [facility] [address], [place] ([phone]) call 911   Patient verbalizes understanding of instructions and care plan provided today and agrees to view in MyChart. Active MyChart status and patient understanding of how to access instructions and care plan via MyChart confirmed with patient.     No further follow up required:    Vashti Bolanos RN, BSN,CCM, CDE Care Management Coordinator [facility] Care Management [phone]     

## 2022-03-11 NOTE — Patient Outreach (Signed)
  Care Coordination   Initial Visit Note   03/11/2022 Name: NIKYLA NAVEDO MRN: [id number] DOB: 07-14-1951  Angellica Maddison Hestand is a 71 y.o. year old female who sees [place], [place], [place] for primary care. I spoke with  Asencion Noble Bridgers by phone today.  What matters to the patients health and wellness today?  No concerns today .  I am doing good   Goals Addressed             This Visit's Progress    COMPLETED: Care Coordination Activities - no follow up required       Care Coordination Interventions: Advised patient to call to schedule Annual Wellness Visit Provided education to patient re: Annual Wellness Visit, care coordination services Assessed social determinant of health barriers         SDOH assessments and interventions completed:  Yes  SDOH Interventions Today    Flowsheet Row Most Recent Value  SDOH Interventions   Food Insecurity Interventions Intervention Not Indicated  Financial Strain Interventions Intervention Not Indicated  Housing Interventions Intervention Not Indicated  Transportation Interventions Intervention Not Indicated        Care Coordination Interventions Activated:  Yes  Care Coordination Interventions:  Yes, provided   Follow up plan: No further intervention required.   Encounter Outcome:  Pt. Visit Completed  Peter Garter RN, BSN,CCM, CDE Care Management Coordinator [place] [facility] [phone]

## 2022-03-14 ENCOUNTER — Other Ambulatory Visit: Payer: Self-pay | Admitting: Physician Assistant

## 2022-04-08 ENCOUNTER — Encounter: Payer: Self-pay | Admitting: *Deleted

## 2022-04-09 ENCOUNTER — Other Ambulatory Visit: Payer: Self-pay | Admitting: Obstetrics and Gynecology

## 2022-04-09 DIAGNOSIS — N951 Menopausal and female climacteric states: Secondary | ICD-10-CM

## 2022-04-11 NOTE — Telephone Encounter (Signed)
Spoke with pt, she voiced understanding. Msg sent to scheduling.

## 2022-04-12 NOTE — Telephone Encounter (Signed)
Medicare pt is scheduled for OV/med check on 04/15/2022.

## 2022-04-15 ENCOUNTER — Encounter: Payer: Self-pay | Admitting: Obstetrics and Gynecology

## 2022-04-15 ENCOUNTER — Telehealth: Payer: Self-pay | Admitting: *Deleted

## 2022-04-15 ENCOUNTER — Ambulatory Visit (INDEPENDENT_AMBULATORY_CARE_PROVIDER_SITE_OTHER): Payer: PPO | Admitting: Obstetrics and Gynecology

## 2022-04-15 VITALS — BP 136/82 | HR 72 | Ht 62.0 in | Wt 155.6 lb

## 2022-04-15 DIAGNOSIS — N951 Menopausal and female climacteric states: Secondary | ICD-10-CM

## 2022-04-15 DIAGNOSIS — Z9189 Other specified personal risk factors, not elsewhere classified: Secondary | ICD-10-CM

## 2022-04-15 DIAGNOSIS — R4586 Emotional lability: Secondary | ICD-10-CM | POA: Diagnosis not present

## 2022-04-15 MED ORDER — GABAPENTIN 100 MG PO CAPS: ORAL_CAPSULE | ORAL | 3 refills | Status: DC

## 2022-04-15 MED ORDER — VENLAFAXINE HCL ER 37.5 MG PO CP24: ORAL_CAPSULE | ORAL | 3 refills | Status: DC

## 2022-04-15 NOTE — Progress Notes (Signed)
GYNECOLOGY  VISIT   HPI: 71 y.o.   Married White or Caucasian Not Hispanic or Latino  female   [phone] with Patient's last menstrual period was 09/23/2012.   here for medication check  H/O TLH/BSO/rectocele repair in 12/19.  She was started on gabapentin in 8/22 for vasomotor symptoms. Currently taking 300 mg at night, 1 in am. Doesn't make her too tired. She still has some mild hot flashes, ~1 a day.  Occasional mild night sweats. Occasionally sexually active, uses lubrication, no pain.   Increased risk of breast cancer. Last mammogram was in 11/21. Last MRI 9/22.  Colonoscopy UTD.   She now has 3 grandchildren, 2 weeks to 2.5 years.   GYNECOLOGIC HISTORY: Patient's last menstrual period was 09/23/2012. Contraception:hysterectomy  Menopausal hormone therapy: none         OB History     Gravida  2   Para  2   Term  2   Preterm      AB      Living  2      SAB      IAB      Ectopic      Multiple      Live Births  2              Patient Active Problem List   Diagnosis Date Noted  . Snoring 08/17/2019  . Gastroesophageal reflux disease with esophagitis without hemorrhage 08/17/2019  . Bilateral shoulder pain 08/11/2019  . Cervical radiculopathy 07/21/2019  . Chronic constipation   . PONV (postoperative nausea and vomiting)   . Cubital tunnel syndrome, bilateral   . Lactose intolerance   . Seasonal affective disorder ([place])   . SUI (stress urinary incontinence, female)   . Anemia   . Raynauds syndrome   . Superficial thrombophlebitis   . 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  . Laryngopharyngeal reflux (LPR) 04/27/2019  . Deviated septum 12/05/2018  . Sacroiliac joint pain 10/29/2018  . Status post laparoscopic hysterectomy 07/14/2018  . Increased risk of breast cancer, 23.4% 05/10/2018  . Menopausal vasomotor syndrome 05/10/2018  . Genetic testing 03/19/2018  . Family history of breast cancer   . 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  Osteopenia   . Achalasia 10/28/2017  . Thoracic spondylosis 10/14/2017  . 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  . Scoliosis 10/16/2012  . Rectocele without mention of uterine prolapse 10/14/2012  . OA (osteoarthritis) 10/07/2012  . Eczema 08/08/2011  . Hyperlipidemia 07/24/2011  . Allergic rhinitis 05/24/2009  . Lesion of ulnar nerve 05/24/2009  . Essential hypertension 02/27/2009  . Asthma 02/27/2009  . Lordosis of lumbar region 02/2000    Past Medical History:  Diagnosis Date  . Anemia   . Asthma   . Chronic constipation   . Cubital tunnel syndrome, bilateral   . Family history of adverse reaction to anesthesia    sister and mother-- ponv  . Family history of breast cancer   . Lactose intolerance   . 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 OA (osteoarthritis)   . Osteopenia   . PONV (postoperative nausea and vomiting)   . Raynauds syndrome   . Seasonal affective disorder ([place])   . SUI (stress urinary incontinence, female)   . Superficial thrombophlebitis    x2  left lower leg superficial in  2006  prior to vein stripping     Past Surgical History:  Procedure Laterality Date  . CYSTOSCOPY N/A 07/14/2018   Procedure: CYSTOSCOPY;  Surgeon: Salvadore Dom, MD;  Location: [facility];  Service: Gynecology;  Laterality: N/A;  . D & C HYSTEROSCOPY W/ RESECTION FIBROID  09-21-2002  dr Mohammed Kindle  [place] W/ RESECTION POLYP  12-24-2010  dr Joan Flores  [facility]  . DILATION AND CURETTAGE OF UTERUS    . ESOPHAGEAL DILATION  2013  --- [facility]   Omega dilation for Achalasia  . ESOPHAGOGASTRODUODENOSCOPY N/A 09/20/2017   Procedure: ESOPHAGOGASTRODUODENOSCOPY (EGD);  Surgeon: Clarene Essex, MD;  Location: [facility];  Service: Endoscopy;  Laterality: N/A;  . HELLER MYOTOMY  9833   fundoplication repair hiatal hernia  . KNEE ARTHROSCOPY Left 2006 &  09-27-2003  . KNEE ARTHROSCOPY W/ SYNOVECTOMY Right 09-20-2009  dr Wynelle Link  [facility]  . KNEE SURGERY  Right 1962  . RECTOCELE REPAIR N/A 07/14/2018   Procedure: POSTERIOR REPAIR (RECTOCELE);  Surgeon: Salvadore Dom, MD;  Location: [facility];  Service: Gynecology;  Laterality: N/A;  . ROTATOR CUFF REPAIR Right 09/2011  . skin cancker  01/12/2022   face  . TONSILLECTOMY AND ADENOIDECTOMY  1961  . TOTAL KNEE ARTHROPLASTY Bilateral left 02-05-2006 ;  right 08-14-2007   dr Wynelle Link  [facility]  . TOTAL KNEE REVISION Right 11/26/2017   Procedure: Right knee polyethylene revision;  Surgeon: Gaynelle Arabian, MD;  Location: [facility];  Service: Orthopedics;  Laterality: Right;  Adductor Block  . VARICOSE VEIN SURGERY  09/2005    Current Outpatient Medications  Medication Sig Dispense Refill  . buPROPion (WELLBUTRIN XL) 150 MG 24 hr tablet TAKE 1 TABLET BY MOUTH EVERY DAY 90 tablet 0  . CALCIUM PO Take 15 mLs by mouth daily.    . Cholecalciferol (VITAMIN D3) 1000 units CAPS Take 1,000 Units by mouth daily.    . diclofenac Sodium (VOLTAREN) 1 % GEL Voltaren 1 % topical gel    . docusate sodium (COLACE) 100 MG capsule Take 100 mg by mouth 2 (two) times daily as needed for mild constipation.    Marland Kitchen EPINEPHrine 0.3 mg/0.3 mL IJ SOAJ injection Inject 0.3 mg into the muscle as needed for anaphylaxis. 1 each 1  . esomeprazole (NEXIUM) 40 MG capsule Take 40 mg by mouth See admin instructions. Take 40 mg by mouth daily. May take additional 40 mg if needed for acid reflux    . fluticasone-salmeterol (ADVAIR DISKUS) 250-50 MCG/ACT AEPB TAKE 1 PUFF BY MOUTH TWICE A DAY 180 each 0  . gabapentin (NEURONTIN) 100 MG capsule TAKE 3 CAPSULE BY MOUTH AT BEDTIME and one capsule in the am. 360 capsule 3  . hydrochlorothiazide (HYDRODIURIL) 25 MG tablet Take 1 tablet (25 mg total) by mouth daily. 90 tablet 3  . venlafaxine XR (EFFEXOR-XR) 37.5 MG 24 hr capsule TAKE 1 CAPSULE BY MOUTH EVERY DAY WITH BREAKFAST 90 capsule 0   No current facility-administered medications for this visit.     ALLERGIES: Dilaudid  [hydromorphone hcl], Glucagon, Lactose intolerance (gi), Wasp venom, and Wasp venom protein  Family History  Problem Relation Age of Onset  . Hypertension Father   . Heart disease Father   . Stroke Father   . Cancer Father        Testicular, Bladder, Prostate, Lung  . Heart disease Mother   . Stroke Mother   . Osteoporosis Mother   . Uterine cancer Sister 56  . Breast cancer Sister   . Cancer Sister        rectal, liver,lymph nodes  . Diabetes Maternal  Grandfather   . Heart disease Maternal Grandfather   . Diabetes Paternal Grandmother   . Breast cancer Maternal Aunt     Social History   Socioeconomic History  . Marital status: Married    Spouse name: Not on file  . Number of children: 2  . Years of education: Not on file  . Highest education level: Not on file  Occupational History  . Occupation: Retired     Comment: Forensic scientist  . Smoking status: Never  . Smokeless tobacco: Never  Vaping Use  . Vaping Use: Never used  Substance and Sexual Activity  . Alcohol use: Yes    Alcohol/week: 8.0 standard drinks of alcohol    Types: 8 Glasses of wine per week  . Drug use: No  . Sexual activity: Yes    Partners: Male    Birth control/protection: Post-menopausal, Surgical    Comment: vasectomy, hysterectomy  Other Topics Concern  . Not on file  Social History Narrative   Daughter getting married Spring 2021; 1st grandchild due in June       Hobbies: Oil painting and antiquing    Social Determinants of Health   Financial Resource Strain: Low Risk  (03/11/2022)   Overall Financial Resource Strain (CARDIA)   . Difficulty of Paying Living Expenses: Not hard at all  Food Insecurity: No Food Insecurity (03/11/2022)   Hunger Vital Sign   . Worried About Charity fundraiser in the Last Year: Never true   . Ran Out of Food in the Last Year: Never true  Transportation Needs: No Transportation Needs (03/11/2022)   PRAPARE - Transportation   . Lack of  Transportation (Medical): No   . Lack of Transportation (Non-Medical): No  Physical Activity: Not on file  Stress: Not on file  Social Connections: Not on file  Intimate Partner Violence: Not on file    Review of Systems  All other systems reviewed and are negative.   PHYSICAL EXAMINATION:    BP 136/82   Pulse 72   Ht '5\' 2"'$  (1.575 m)   Wt 155 lb 9.6 oz (70.6 kg)   LMP 09/23/2012   SpO2 99%   BMI 28.46 kg/m     General appearance: alert, cooperative and appears stated age  62. Menopausal vasomotor syndrome She is doing well on the gabapentin, likes the 100 mg capsules - gabapentin (NEURONTIN) 100 MG capsule; TAKE 3 CAPSULE BY MOUTH AT BEDTIME and one capsule in the am.  Dispense: 360 capsule; Refill: 3  2. Mood changes - venlafaxine XR (EFFEXOR-XR) 37.5 MG 24 hr capsule; 1 po qd  Dispense: 90 capsule; Refill: 3  3. Increased risk of breast cancer Mammogram overdue, she will schedule Will schedule her breast MRI in 6 months

## 2022-04-15 NOTE — Telephone Encounter (Signed)
Recall letter placed for patient to call and schedule the below.

## 2022-04-15 NOTE — Telephone Encounter (Signed)
-----   Message from Salvadore Dom, MD sent at 04/15/2022 12:36 PM EDT ----- Please schedule her for a breast MRI in 6 months.  Elevated risk of breast cancer.  Thanks,  Sharee Pimple

## 2022-04-17 ENCOUNTER — Encounter: Payer: Self-pay | Admitting: Obstetrics and Gynecology

## 2022-04-24 ENCOUNTER — Other Ambulatory Visit: Payer: Self-pay | Admitting: Physician Assistant

## 2022-05-10 ENCOUNTER — Ambulatory Visit (INDEPENDENT_AMBULATORY_CARE_PROVIDER_SITE_OTHER): Payer: PPO | Admitting: Internal Medicine

## 2022-05-10 ENCOUNTER — Encounter: Payer: Self-pay | Admitting: Internal Medicine

## 2022-05-10 VITALS — BP 147/87 | HR 70 | Temp 98.1°F | Resp 12 | Ht 62.0 in | Wt 156.0 lb

## 2022-05-10 DIAGNOSIS — J328 Other chronic sinusitis: Secondary | ICD-10-CM

## 2022-05-10 DIAGNOSIS — J029 Acute pharyngitis, unspecified: Secondary | ICD-10-CM

## 2022-05-10 DIAGNOSIS — R051 Acute cough: Secondary | ICD-10-CM | POA: Diagnosis not present

## 2022-05-10 LAB — POCT RAPID STREP A (OFFICE): Rapid Strep A Screen: NEGATIVE

## 2022-05-10 LAB — POC COVID19 BINAXNOW: SARS Coronavirus 2 Ag: NEGATIVE

## 2022-05-10 MED ORDER — DOXYCYCLINE HYCLATE 100 MG PO TABS: 100.0000 mg | ORAL_TABLET | Freq: Two times a day (BID) | ORAL | 0 refills | Status: DC

## 2022-05-10 MED ORDER — DEXTROMETHORPHAN-GUAIFENESIN 10-100 MG/5ML PO LIQD: 10.0000 mL | ORAL | 1 refills | Status: DC | PRN

## 2022-05-10 MED ORDER — PSEUDOEPHEDRINE HCL ER 120 MG PO TB12: 120.0000 mg | ORAL_TABLET | Freq: Two times a day (BID) | ORAL | 0 refills | Status: DC

## 2022-05-10 NOTE — Patient Instructions (Addendum)
It was a pleasure seeing you today!  Today the plan is...  Other chronic sinusitis -     Pseudoephedrine HCl ER; Take 1 tablet (120 mg total) by mouth 2 (two) times daily. Discontinue if any shortness of breath, chest pain, or heartbeat irregularities.  Dispense: 20 tablet; Refill: 0 -     Dextromethorphan-guaiFENesin; Take 10 mLs by mouth every 4 (four) hours as needed for cough.  Dispense: 120 mL; Refill: 1 -     Doxycycline Hyclate; Take 1 tablet (100 mg total) by mouth 2 (two) times daily. Take this after 7 days into the sinus infection if not feeling better  Dispense: 20 tablet; Refill: 0  Acute cough -     POC COVID-19 BinaxNow  Sore throat -     POCT rapid strep A     Loralee Pacas, MD If you are not doing as well as expected, call and return to the office sooner If your condition begins to worsen or become severe:  go to the ER If you have follow-up questions / concerns: please contact me via phone [phone] or MyChart messaging Please bring all your medicines to your next appointment. This is the best way for me to know exactly what you're taking.

## 2022-05-10 NOTE — Progress Notes (Signed)
[place] at [facility]:  [phone]   Routine Medical Office Visit  Patient:  Beth Peters      Age: 71 y.o.       Sex:  female  Date:   05/10/2022  PCP:    Inda Coke, [place] Provider: Loralee Pacas, MD  Assessment/Plan:   Gabbi was seen today for sore throat, cough, fatigue, covid test and headache.  Other chronic sinusitis -     Pseudoephedrine HCl ER; Take 1 tablet (120 mg total) by mouth 2 (two) times daily. Discontinue if any shortness of breath, chest pain, or heartbeat irregularities.  Dispense: 20 tablet; Refill: 0 -     Dextromethorphan-guaiFENesin; Take 10 mLs by mouth every 4 (four) hours as needed for cough.  Dispense: 120 mL; Refill: 1 -     Doxycycline Hyclate; Take 1 tablet (100 mg total) by mouth 2 (two) times daily. Take this after 7 days into the sinus infection if not feeling better  Dispense: 20 tablet; Refill: 0  Acute cough -     POC COVID-19 BinaxNow  Sore throat -     POCT rapid strep A  Symptoms were purely upper respiratory throat and sinuses and the COVID and strep throat test were negative-suspect RSV or other serious acute sinusitis - still,  I did send in antibiotics to only be taken if it goes beyond 7 days and not getting better and otherwise symptomatic therapy only  Today's key discussion points - also in After Visit Summary (AVS) Common side effects, risks, benefits, and alternatives for medications and treatment plan prescribed today were discussed, and she expressed understanding of the given instructions.  Medication list was reconciled and patient instructions and summary information was documented and made available for her to review in the AVS (see AVS).  This note is also available to patient for review for accuracy and understanding. She was encouraged to contact our office by phone or message via MyChart if she has any questions or concerns regarding our treatment plan (see AVS).  No  barriers to understanding were identified We discussed red flag symptoms and signs in detail and when to call the office or go to ER if her condition worsens (see AFTER VISIT SUMMARY).. She expressed understanding.    Subjective:   Beth Peters is a 71 y.o. 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 She is miserable Feels like sore throat every am - has achalasia Recently went on cruise. She thought was jet lag from travel but  then coughing started 3-4 days ago Not sleeping well The asthma is very mild- using as needed am before [facility] and on cruise. Denies flaring wheezing shortness of breath with current infection Doesn't feel like her allergies have been flared recently. Denies myalgia, fever        Objective:  Physical Exam: BP (!) 147/87 (BP Location: Right Arm, Patient Position: Sitting)   Pulse 70   Temp 98.3 F (36.8 C) (Temporal)   Resp 12   Ht '5\' 2"'$  (1.575 m)   Wt 167 lb 12.8 oz (76.1 kg)   LMP 09/23/2012   SpO2 99%   BMI 30.69 kg/m   She is a polite, friendly, and genuine person Constitutional: NAD, AAO, not ill-appearing  Neuro: alert, no focal  deficit obvious, articulate speech Psych: normal mood, behavior, thought content   Problem specific physical exam findings:  She is very congested appearing she has light pink and pale boggy nasal mucosa with no polyps or pus evident very clear airway, it is almost impossible to see her oropharynx due to she has a very inferiorly located upper palate, she looks quite ill appearing but not having any difficulty breathing, her eyes are watering after the swab but they feel like they have been watering some she is got a big hard lymph node on her right side   Throat COVID swab and strep swab both returned negative on point-of-care testing Results for orders placed or performed in visit on 05/10/22  POCT rapid strep A  Result Value Ref Range   Rapid Strep A Screen Negative Negative  POC COVID-19  Result Value Ref Range   SARS Coronavirus 2 Ag Negative Negative

## 2022-06-27 ENCOUNTER — Encounter: Payer: Self-pay | Admitting: *Deleted

## 2022-07-28 ENCOUNTER — Other Ambulatory Visit: Payer: Self-pay | Admitting: Physician Assistant

## 2022-09-05 ENCOUNTER — Other Ambulatory Visit: Payer: Self-pay | Admitting: Internal Medicine

## 2022-09-05 DIAGNOSIS — K219 Gastro-esophageal reflux disease without esophagitis: Secondary | ICD-10-CM

## 2022-09-05 DIAGNOSIS — K220 Achalasia of cardia: Secondary | ICD-10-CM

## 2022-10-02 ENCOUNTER — Other Ambulatory Visit (HOSPITAL_COMMUNITY): Payer: Self-pay | Admitting: Internal Medicine

## 2022-10-02 DIAGNOSIS — K220 Achalasia of cardia: Secondary | ICD-10-CM

## 2022-10-02 DIAGNOSIS — K219 Gastro-esophageal reflux disease without esophagitis: Secondary | ICD-10-CM

## 2022-10-15 ENCOUNTER — Ambulatory Visit (HOSPITAL_COMMUNITY)
Admission: RE | Admit: 2022-10-15 | Discharge: 2022-10-15 | Disposition: A | Discharge status: Home / Self Care | Payer: PPO | Source: Ambulatory Visit | Attending: Internal Medicine | Admitting: Internal Medicine

## 2022-10-15 DIAGNOSIS — K220 Achalasia of cardia: Secondary | ICD-10-CM | POA: Diagnosis present

## 2022-10-15 DIAGNOSIS — K219 Gastro-esophageal reflux disease without esophagitis: Secondary | ICD-10-CM | POA: Diagnosis present

## 2022-10-17 DIAGNOSIS — C44622 Squamous cell carcinoma of skin of right upper limb, including shoulder: Secondary | ICD-10-CM | POA: Diagnosis not present

## 2022-10-23 ENCOUNTER — Telehealth: Payer: Self-pay | Admitting: Physician Assistant

## 2022-10-23 NOTE — Telephone Encounter (Signed)
Contacted Eleni Hlavacek Lantzy to schedule their annual wellness visit. Appointment made for 11/14/2022.  Gabriel Cirri [facility] AWV TEAM Direct Dial [phone]

## 2022-10-29 ENCOUNTER — Other Ambulatory Visit: Payer: Self-pay | Admitting: Physician Assistant

## 2022-10-31 ENCOUNTER — Ambulatory Visit (INDEPENDENT_AMBULATORY_CARE_PROVIDER_SITE_OTHER): Payer: PPO | Admitting: Physician Assistant

## 2022-10-31 ENCOUNTER — Encounter: Payer: Self-pay | Admitting: Physician Assistant

## 2022-10-31 VITALS — BP 130/70 | Temp 97.3°F | Ht 62.0 in | Wt 156.0 lb

## 2022-10-31 DIAGNOSIS — R5383 Other fatigue: Secondary | ICD-10-CM | POA: Diagnosis not present

## 2022-10-31 DIAGNOSIS — Z8349 Family history of other endocrine, nutritional and metabolic diseases: Secondary | ICD-10-CM

## 2022-10-31 DIAGNOSIS — L659 Nonscarring hair loss, unspecified: Secondary | ICD-10-CM

## 2022-10-31 DIAGNOSIS — J312 Chronic pharyngitis: Secondary | ICD-10-CM

## 2022-10-31 DIAGNOSIS — I10 Essential (primary) hypertension: Secondary | ICD-10-CM

## 2022-10-31 DIAGNOSIS — G4733 Obstructive sleep apnea (adult) (pediatric): Secondary | ICD-10-CM

## 2022-10-31 DIAGNOSIS — J45909 Unspecified asthma, uncomplicated: Secondary | ICD-10-CM

## 2022-10-31 LAB — COMPREHENSIVE METABOLIC PANEL
ALT: 15 U/L (ref 0–35)
AST: 15 U/L (ref 0–37)
Albumin: 4.3 g/dL (ref 3.5–5.2)
Alkaline Phosphatase: 66 U/L (ref 39–117)
BUN: 13 mg/dL (ref 6–23)
CO2: 34 mEq/L — ABNORMAL HIGH (ref 19–32)
Calcium: 9.7 mg/dL (ref 8.4–10.5)
Chloride: 97 mEq/L (ref 96–112)
Creatinine, Ser: 0.8 mg/dL (ref 0.40–1.20)
GFR: 74.6 mL/min (ref >60.00)
Glucose, Bld: 90 mg/dL (ref 70–99)
Potassium: 3.9 mEq/L (ref 3.5–5.1)
Sodium: 139 mEq/L (ref 135–145)
Total Bilirubin: 0.5 mg/dL (ref 0.2–1.2)
Total Protein: 6.5 g/dL (ref 6.0–8.3)

## 2022-10-31 LAB — CBC WITH DIFFERENTIAL/PLATELET
Basophils Absolute: 0.1 10*3/uL (ref 0.0–0.1)
Basophils Relative: 1.3 % (ref 0.0–3.0)
Eosinophils Absolute: 0.2 10*3/uL (ref 0.0–0.7)
Eosinophils Relative: 3.9 % (ref 0.0–5.0)
HCT: 43 % (ref 36.0–46.0)
Hemoglobin: 14.5 g/dL (ref 12.0–15.0)
Lymphocytes Relative: 42.1 % (ref 12.0–46.0)
Lymphs Abs: 2.4 10*3/uL (ref 0.7–4.0)
MCHC: 33.7 g/dL (ref 30.0–36.0)
MCV: 91.9 fl (ref 78.0–100.0)
Monocytes Absolute: 0.4 10*3/uL (ref 0.1–1.0)
Monocytes Relative: 6.9 % (ref 3.0–12.0)
Neutro Abs: 2.6 10*3/uL (ref 1.4–7.7)
Neutrophils Relative %: 45.8 % (ref 43.0–77.0)
Platelets: 243 10*3/uL (ref 150.0–400.0)
RBC: 4.68 Mil/uL (ref 3.87–5.11)
RDW: 13.4 % (ref 11.5–15.5)
WBC: 5.6 10*3/uL (ref 4.0–10.5)

## 2022-10-31 LAB — IBC + FERRITIN
Ferritin: 37.1 ng/mL (ref 10.0–291.0)
Iron: 92 ug/dL (ref 42–145)
Saturation Ratios: 26 % (ref 20.0–50.0)
TIBC: 354.2 ug/dL (ref 250.0–450.0)
Transferrin: 253 mg/dL (ref 212.0–360.0)

## 2022-10-31 LAB — VITAMIN D 25 HYDROXY (VIT D DEFICIENCY, FRACTURES): VITD: 34.64 ng/mL (ref 30.00–100.00)

## 2022-10-31 LAB — T4, FREE: Free T4: 0.91 ng/dL (ref 0.60–1.60)

## 2022-10-31 LAB — VITAMIN B12: Vitamin B-12: 328 pg/mL (ref 211–911)

## 2022-10-31 LAB — T3, FREE: T3, Free: 2.9 pg/mL (ref 2.3–4.2)

## 2022-10-31 LAB — TSH: TSH: 1.03 u[IU]/mL (ref 0.35–5.50)

## 2022-10-31 MED ORDER — FLUTICASONE-SALMETEROL 250-50 MCG/ACT IN AEPB: 1.0000 | INHALATION_SPRAY | Freq: Two times a day (BID) | RESPIRATORY_TRACT | 2 refills | Status: DC

## 2022-10-31 MED ORDER — HYDROCHLOROTHIAZIDE 25 MG PO TABS: 25.0000 mg | ORAL_TABLET | Freq: Every day | ORAL | 3 refills | Status: DC

## 2022-10-31 MED ORDER — EPINEPHRINE 0.3 MG/0.3ML IJ SOAJ: 0.3000 mg | INTRAMUSCULAR | 1 refills | Status: AC | PRN

## 2022-10-31 NOTE — Patient Instructions (Signed)
It was great to see you!  We will update blood work today  Referral to go back to Dr Dohmeier  Referral for ENT  Potential referral for endo - will await blood work results  Take care,  Jarold Motto PA-C

## 2022-10-31 NOTE — Progress Notes (Signed)
Beth Peters is a 72 y.o. female here for a follow up of a pre-existing problem.  History of Present Illness:   Chief Complaint  Patient presents with   Medical Management of Chronic Issues    Hypertension, hyperlipidemia   sleep study    Pt wants to discuss ordering    Thyroid Problem    Hair thinning    HPI  Hypertension She reports compliance with HCTZ  daily. Needs refill. Denies chest pain, SOB, LE swelling. BP Readings from Last 5 Encounters:  10/31/22 130/70  05/10/22 (!) 147/87  04/15/22 136/82  01/25/22 130/80  02/13/21 130/62     Achalasia/Chronic Sore Throat She reports a chronic sore throat and hoarseness. She notes secondary postnasal drainage and would like to know if this is related to her achalasia or has some other etiology. She would a referral to ENT for further work-up.  Esophagus/Barium Swallow study 10/15/2022: IMPRESSION: 1. Chronic achalasia/stenosis at the distal esophagus/gastroesophageal junction. Narrowing at this site appears progressed from the prior esophagram of 05/29/2017. A swallowed 13 mm barium tablet did not pass beyond this level despite a prolonged period of observation. Additionally, there was delayed and intermittent passage of liquid contrast beyond this point. The esophagus is patulous elsewhere, similar to prior exams. 2. Esophageal dysmotility with tertiary contractions.   Asthma She reports difficulty getting her Advair from the pharmacy due to supply issues. She would like to change to a similar alternative due to this issue. Symptoms well controlled.   Hair loss She reports significant hair loss in the last x1.5 years. She notes accompanying brittle nails, constipation, dry skin, and sensitivity to cold (also ha h/o Raynaud's), numbness/tingling in hands (h/o cervical radiculopathy), and scalloped/thick tongue. She would like to repeat blood work. If it is normal she would like a referral to  endocrinology. Lab Results  Component Value Date   TSH 1.13 06/01/2019   Lab Results  Component Value Date   WBC 4.3 01/25/2022   HGB 14.6 01/25/2022   HCT 43.5 01/25/2022   MCV 92.2 01/25/2022   PLT 184.0 01/25/2022   Lab Results  Component Value Date   VITAMINB12 205 (L) 06/01/2019     Osteoarthritis She reports h/o of arthritis in her hands, most severe in her thumbs.  She has associated weakness in her hands. She has been doing PT for this.   Sleep problem She reports mild sleep apnea on study in 2020. She would like to have a repeat sleep study ordered. She notes issues with snoring and PND.   Anxiety/Depression She reports symptoms are well controlled with Wellbutrin XL  daily and Effexor XR 37.5mg  daily. Denies any SI/HI.   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   OA (osteoarthritis)    Osteopenia    PONV (postoperative nausea and vomiting)    Raynauds syndrome    Seasonal affective disorder    SUI (stress urinary incontinence, female)    Superficial thrombophlebitis    x2  left lower leg superficial in  2006  prior to vein stripping      Social History   Tobacco Use   Smoking status: Never   Smokeless tobacco: Never  Vaping Use   Vaping Use: Never used  Substance Use Topics   Alcohol use: Yes    Alcohol/week: 8.0 standard drinks of alcohol    Types: 8 Glasses of wine per week   Drug use: No    Past Surgical History:  Procedure Laterality Date   CYSTOSCOPY N/A 07/14/2018   Procedure: CYSTOSCOPY;  Surgeon: Romualdo Bolk, MD;  Location: [facility];  Service: Gynecology;  Laterality: N/A;   D & C HYSTEROSCOPY W/ RESECTION FIBROID  09-21-2002  dr Maggie Schwalbe  [facility]   D & C  HYSTEROSCOPY W/ RESECTION POLYP  12-24-2010  dr Tresa Res  [facility]   DILATION AND CURETTAGE OF UTERUS     ESOPHAGEAL DILATION  2013  --- [facility]   Omega dilation for Achalasia   ESOPHAGOGASTRODUODENOSCOPY N/A 09/20/2017   Procedure: ESOPHAGOGASTRODUODENOSCOPY (EGD);  Surgeon: Vida Rigger, MD;  Location: [facility];  Service: Endoscopy;  Laterality: N/A;   HELLER MYOTOMY  2006   fundoplication repair hiatal hernia   KNEE ARTHROSCOPY Left 2006 &  09-27-2003   KNEE ARTHROSCOPY W/ SYNOVECTOMY Right 09-20-2009  dr Lequita Halt  [facility]   KNEE SURGERY Right 1962   RECTOCELE REPAIR N/A 07/14/2018   Procedure: POSTERIOR REPAIR (RECTOCELE);  Surgeon: Romualdo Bolk, MD;  Location: [facility];  Service: Gynecology;  Laterality: N/A;   ROTATOR CUFF REPAIR Right 09/2011   skin cancker  01/12/2022   face   TONSILLECTOMY AND ADENOIDECTOMY  1961   TOTAL KNEE ARTHROPLASTY Bilateral left 02-05-2006 ;  right 08-14-2007   dr Lequita Halt  [facility]   TOTAL KNEE REVISION Right 11/26/2017   Procedure: Right knee polyethylene revision;  Surgeon: Ollen Gross, MD;  Location: [facility];  Service: Orthopedics;  Laterality: Right;  Adductor Block   VARICOSE VEIN SURGERY  09/2005    Family History  Problem Relation Age of Onset   Hypertension Father    Heart disease Father    Stroke Father    Cancer Father        Testicular, Bladder, Prostate, Lung   Heart disease Mother    Stroke Mother    Osteoporosis Mother    Uterine cancer Sister 33   Breast cancer Sister    Cancer Sister        rectal, liver,lymph nodes   Diabetes Maternal Grandfather    Heart disease Maternal Grandfather    Diabetes Paternal Grandmother    Breast cancer Maternal Aunt     Allergies  Allergen Reactions   Dilaudid [Hydromorphone Hcl] Hives, Shortness Of Breath and Itching   Glucagon Anaphylaxis   Lactose Intolerance (Gi)    Wasp Venom Itching and Swelling   Wasp Venom Protein Itching and Hives    Current Medications:    Current Outpatient Medications:    buPROPion (WELLBUTRIN XL) 150 MG 24 hr tablet, TAKE 1 TABLET BY MOUTH EVERY DAY, Disp: 90 tablet, Rfl: 0   CALCIUM PO, Take 15 mLs by mouth daily., Disp: , Rfl:    Cholecalciferol (VITAMIN D3) 1000 units CAPS, Take 1,000 Units by mouth daily., Disp: , Rfl:    diclofenac Sodium (VOLTAREN) 1 % GEL, Voltaren 1 % topical gel, Disp: , Rfl:    docusate sodium (COLACE) 100 MG capsule, Take 100 mg by mouth 2 (two) times daily as needed for mild constipation., Disp: , Rfl:    EPINEPHrine 0.3 mg/0.3 mL IJ SOAJ injection, Inject 0.3 mg into the muscle as needed for anaphylaxis., Disp: 1 each, Rfl: 1   esomeprazole (  NEXIUM) 40 MG capsule, Take 40 mg by mouth See admin instructions. Take 40 mg by mouth daily. May take additional 40 mg if needed for acid reflux, Disp: , Rfl:    fluticasone-salmeterol (WIXELA INHUB) 250-50 MCG/ACT AEPB, Inhale 1 puff into the lungs in the morning and at bedtime., Disp: 1 each, Rfl: 2   gabapentin (NEURONTIN) 100 MG capsule, TAKE 3 CAPSULE BY MOUTH AT BEDTIME and one capsule in the am., Disp: 360 capsule, Rfl: 3   hydrochlorothiazide (HYDRODIURIL) 25 MG tablet, Take 1 tablet (25 mg total) by mouth daily., Disp: 90 tablet, Rfl: 3   venlafaxine XR (EFFEXOR-XR) 37.5 MG 24 hr capsule, 1 po qd, Disp: 90 capsule, Rfl: 3   Review of Systems:   Review of Systems  Constitutional:  Negative for fever and weight loss.  HENT:  Positive for sore throat. Negative for hearing loss.        + hoarseness  Eyes:  Negative for blurred vision and photophobia.  Respiratory:  Negative for cough and sputum production.   Cardiovascular:  Positive for PND. Negative for chest pain and palpitations.  Gastrointestinal:  Positive for constipation.  Genitourinary:  Negative for dysuria and hematuria.  Musculoskeletal:  Positive for joint pain. Negative for myalgias.  Skin:  Negative for itching and rash.  Neurological:  Positive for tingling and weakness (hands).  Negative for dizziness, sensory change and headaches.  Endo/Heme/Allergies:  Negative for environmental allergies. Does not bruise/bleed easily.       + hair loss + dry skin  Psychiatric/Behavioral:  Negative for hallucinations and suicidal ideas.     Vitals:   Vitals:   10/31/22 1122  BP: 130/70  Temp: (!) 97.3 F (36.3 C)  TempSrc: Temporal  Weight: 156 lb (70.8 kg)  Height:  (1.575 m)     Body mass index is 28.53 kg/m.  Physical Exam:   Physical Exam Vitals and nursing note reviewed.  Constitutional:      General: She is not in acute distress.    Appearance: Normal appearance. She is well-developed. She is not ill-appearing or toxic-appearing.  HENT:     Head: Normocephalic.     Mouth/Throat:     Pharynx: Oropharynx is clear.  Eyes:     General: No scleral icterus. Neck:     Thyroid: No thyromegaly.     Vascular: No carotid bruit.  Cardiovascular:     Rate and Rhythm: Normal rate and regular rhythm.     Pulses: Normal pulses.     Heart sounds: Normal heart sounds, S1 normal and S2 normal. No murmur heard. Pulmonary:     Effort: Pulmonary effort is normal.     Breath sounds: Normal breath sounds.  Musculoskeletal:     Cervical back: Neck supple. No rigidity.     Right lower leg: No edema.     Left lower leg: No edema.  Lymphadenopathy:     Cervical: No cervical adenopathy.  Skin:    General: Skin is warm and dry.  Neurological:     Mental Status: She is alert. Mental status is at baseline.     GCS: GCS eye subscore is 4. GCS verbal subscore is 5. GCS motor subscore is 6.  Psychiatric:        Speech: Speech normal.        Behavior: Behavior normal. Behavior is cooperative.     Assessment and Plan:   Essential hypertension No red flags Normotensive Continue HCTZ 25 mg daily  Follow-up in 6  months, sooner if concerns  Family history of thyroid disease Update thyroid panel per patient request Likely refer to endo due to her level of  concern  Fatigue, unspecified type Update blood work to r/o organic cause UTD on all cancer screenings Continue efforts at healthy lifestyle  Hair thinning Update thyroid and anemia panel per patient  Asthma, unspecified asthma severity, unspecified whether complicated, unspecified whether persistent Controlled Will send in [facility] for patient Follow-up if any concerns  Chronic sore throat Referral to ENT per patient request  OSA (obstructive sleep apnea) Referral back to [facility] with [facility]   I,Alexis Herring,acting as a scribe for [employer], PA.,have documented all relevant documentation on the behalf of Jarold Motto, PA,as directed by  Jarold Motto, PA while in the presence of Jarold Motto, [place].  I, Jarold Motto, [place], have reviewed all documentation for this visit. The documentation on 10/31/22 for the exam, diagnosis, procedures, and orders are all accurate and complete.

## 2022-11-14 ENCOUNTER — Ambulatory Visit (INDEPENDENT_AMBULATORY_CARE_PROVIDER_SITE_OTHER): Payer: PPO

## 2022-11-14 VITALS — Wt 150.0 lb

## 2022-11-14 DIAGNOSIS — Z0000 Encounter for general adult medical examination without abnormal findings: Secondary | ICD-10-CM

## 2022-11-14 NOTE — Progress Notes (Signed)
I connected with  Beth Peters on 11/14/22 by a audio enabled telemedicine application and verified that I am speaking with the correct person using two identifiers.  Patient Location: Home  Provider Location: Office/Clinic  I discussed the limitations of evaluation and management by telemedicine. The patient expressed understanding and agreed to proceed.   Subjective:   Beth Peters is a 72 y.o. female who presents for Medicare Annual (Subsequent) preventive examination.    Patient Medicare AWV questionnaire was completed by the patient on 11/13/22 I have confirmed that all information answered by patient is correct and no changes since this date.     Review of Systems     Cardiac Risk Factors include: advanced age (>73men, >104 women);hypertension     Objective:    Today's Vitals   11/14/22 1010  Weight: 150 lb (68 kg)   Body mass index is 27.44 kg/m.     11/14/2022   10:16 AM 12/17/2019    5:23 PM 10/28/2019    3:07 PM 07/14/2018    6:24 AM 07/09/2018    9:43 AM 11/26/2017    1:00 PM 11/26/2017    8:32 AM  Advanced Directives  Does Patient Have a Medical Advance Directive? Yes Yes Yes No No Yes Yes  Type of Estate agent of [place];Living will Healthcare Power of [place];Living will Living will;Healthcare Power of Attorney   Living will Living will  Does patient want to make changes to medical advance directive?   No - Patient declined   No - Patient declined No - Patient declined  Copy of Healthcare Power of Attorney in Chart? No - copy requested  No - copy requested      Would patient like information on creating a medical advance directive?    No - Patient declined No - Patient declined      Current Medications (verified) Outpatient Encounter Medications as of 11/14/2022  Medication Sig   buPROPion (WELLBUTRIN XL) 150 MG 24 hr tablet TAKE 1 TABLET BY MOUTH EVERY DAY   CALCIUM PO Take 15 mLs by mouth daily.   Cholecalciferol (VITAMIN D3)  1000 units CAPS Take 1,000 Units by mouth daily.   diclofenac Sodium (VOLTAREN) 1 % GEL Voltaren 1 % topical gel   docusate sodium (COLACE) 100 MG capsule Take 100 mg by mouth 2 (two) times daily as needed for mild constipation.   EPINEPHrine 0.3 mg/0.3 mL IJ SOAJ injection Inject 0.3 mg into the muscle as needed for anaphylaxis.   esomeprazole (NEXIUM) 40 MG capsule Take 40 mg by mouth See admin instructions. Take 40 mg by mouth daily. May take additional 40 mg if needed for acid reflux   fluticasone-salmeterol (WIXELA INHUB) 250-50 MCG/ACT AEPB Inhale 1 puff into the lungs in the morning and at bedtime.   gabapentin (NEURONTIN) 100 MG capsule TAKE 3 CAPSULE BY MOUTH AT BEDTIME and one capsule in the am.   hydrochlorothiazide (HYDRODIURIL) 25 MG tablet Take 1 tablet (25 mg total) by mouth daily.   venlafaxine XR (EFFEXOR-XR) 37.5 MG 24 hr capsule 1 po qd   No facility-administered encounter medications on file as of 11/14/2022.    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   OA (osteoarthritis)    Osteopenia    PONV (postoperative nausea and vomiting)    Raynauds syndrome    Seasonal affective disorder (HCC)    SUI (stress urinary incontinence, female)    Superficial thrombophlebitis    x2  left lower leg superficial in  2006  prior to vein stripping    Past Surgical History:  Procedure Laterality Date   CYSTOSCOPY N/A 07/14/2018   Procedure: CYSTOSCOPY;  Surgeon: Romualdo Bolk, MD;  Location: [facility];  Service: Gynecology;  Laterality: N/A;   D & C HYSTEROSCOPY W/ RESECTION  FIBROID  09-21-2002  dr Maggie Schwalbe  [facility]   D & C HYSTEROSCOPY W/ RESECTION POLYP  12-24-2010  dr Tresa Res  [facility]   DILATION AND CURETTAGE OF UTERUS     ESOPHAGEAL DILATION  2013  --- [facility]   Omega dilation for Achalasia   ESOPHAGOGASTRODUODENOSCOPY N/A 09/20/2017   Procedure: ESOPHAGOGASTRODUODENOSCOPY (EGD);  Surgeon: Vida Rigger, MD;  Location: [facility];  Service: Endoscopy;  Laterality: N/A;   HELLER MYOTOMY  2006   fundoplication repair hiatal hernia   KNEE ARTHROSCOPY Left 2006 &  09-27-2003   KNEE ARTHROSCOPY W/ SYNOVECTOMY Right 09-20-2009  dr Lequita Halt  [facility]   KNEE SURGERY Right 1962   RECTOCELE REPAIR N/A 07/14/2018   Procedure: POSTERIOR REPAIR (RECTOCELE);  Surgeon: Romualdo Bolk, MD;  Location: [facility];  Service: Gynecology;  Laterality: N/A;   ROTATOR CUFF REPAIR Right 09/2011   skin cancker  01/12/2022   face   TONSILLECTOMY AND ADENOIDECTOMY  1961   TOTAL KNEE ARTHROPLASTY Bilateral left 02-05-2006 ;  right 08-14-2007   dr Lequita Halt  [facility]   TOTAL KNEE REVISION Right 11/26/2017   Procedure: Right knee polyethylene revision;  Surgeon: Ollen Gross, MD;  Location: [facility];  Service: Orthopedics;  Laterality: Right;  Adductor Block   VARICOSE VEIN SURGERY  09/2005   Family History  Problem Relation Age of Onset   Hypertension Father    Heart disease Father    Stroke Father    Cancer Father        Testicular, Bladder, Prostate, Lung   Heart disease Mother    Stroke Mother    Osteoporosis Mother    Uterine cancer Sister 25   Breast cancer Sister    Cancer Sister        rectal, liver,lymph nodes   Diabetes Maternal Grandfather    Heart disease Maternal Grandfather    Diabetes Paternal Grandmother    Breast cancer Maternal Aunt    Social History   Socioeconomic History   Marital status: Married    Spouse name: Not on file   Number of children: 2   Years of education: Not on file   Highest education level: Bachelor's degree (e.g., BA, AB, BS)   Occupational History   Occupation: Retired     Comment: Social research officer, government   Tobacco Use   Smoking status: Never   Smokeless tobacco: Never  Vaping Use   Vaping Use: Never used  Substance and Sexual Activity   Alcohol use: Yes    Alcohol/week: 8.0 standard drinks of alcohol    Types: 8 Glasses of wine per week   Drug use: No   Sexual activity: Yes    Partners: Male    Birth control/protection: Post-menopausal, Surgical    Comment: vasectomy, hysterectomy  Other Topics Concern   Not on file  Social History Narrative   Daughter getting married Spring 2021; 1st grandchild due in June       Hobbies: Oil painting and antiquing    Social Determinants of Health   Financial Resource Strain: Low Risk  (11/13/2022)   Overall Financial Resource Strain (CARDIA)    Difficulty of Paying Living Expenses: Not hard at all  Food Insecurity: No Food Insecurity (11/13/2022)   Hunger Vital Sign    Worried About Running Out of Food in the Last Year: Never true    Ran Out of Food in the Last Year: Never true  Transportation Needs: No Transportation Needs (11/13/2022)   PRAPARE - Administrator, Civil Service (Medical): No    Lack of Transportation (Non-Medical): No  Physical Activity: Sufficiently Active (11/13/2022)   Exercise Vital Sign    Days of Exercise per Week: 4 days    Minutes of Exercise per Session: 60 min  Stress: No Stress Concern Present (11/13/2022)   [employer] of Occupational Health - Occupational Stress Questionnaire    Feeling of Stress : Only a little  Social Connections: Socially Integrated (11/13/2022)   Social Connection and Isolation Panel [NHANES]    Frequency of Communication with Friends and Family: More than three times a week    Frequency of Social Gatherings with Friends and Family: Twice a week    Attends Religious Services: More than 4 times per year    Active Member of [employer] or Organizations: Yes    Attends Engineer, structural: More than 4  times per year    Marital Status: Married    Tobacco Counseling Counseling given: Not Answered   Clinical Intake:  Pre-visit preparation completed: Yes  Pain : No/denies pain     BMI - recorded: 27.44 Nutritional Status: BMI 25 -29 Overweight Nutritional Risks: None Diabetes: No  How often do you need to have someone help you when you read instructions, pamphlets, or other written materials from your doctor or pharmacy?: 1 - Never  Diabetic?no  Interpreter Needed?: No  Information entered by :: Lanier Ensign, LPN   Activities of Daily Living    11/13/2022   10:51 PM  In your present state of health, do you have any difficulty performing the following activities:  Hearing? 0  Vision? 0  Difficulty concentrating or making decisions? 1  Walking or climbing stairs? 0  Dressing or bathing? 0  Doing errands, shopping? 0  Preparing Food and eating ? N  Using the Toilet? N  In the past six months, have you accidently leaked urine? N  Do you have problems with loss of bowel control? N  Managing your Medications? N  Managing your Finances? N  Housekeeping or managing your Housekeeping? N    Patient Care Team: Jarold Motto, [place] as PCP - General (Physician Assistant) Ollen Gross, MD as Consulting Physician (Orthopedic Surgery) Su Hoff, PA-C as Physician Assistant (Chiropractic Medicine) Romualdo Bolk, MD as Consulting Physician (Obstetrics and Gynecology) Bernette Redbird, MD as Consulting Physician (Gastroenterology) Sheran Luz, MD as Consulting Physician (Physical Medicine and Rehabilitation) Beth Lick, MD as Consulting Physician (Orthopedic Surgery) Barnett Abu, MD as Consulting Physician (Neurosurgery) Serena Colonel, MD as Consulting Physician (Otolaryngology) Dohmeier, Porfirio Mylar, MD as Consulting Physician (Neurology)  Indicate any recent Medical Services you may have received from other than [facility] providers in the past year (date may be  approximate).     Assessment:   This is a  routine wellness examination for Denine.  Hearing/Vision screen Hearing Screening - Comments:: Pt denies any hearing issues  Vision Screening - Comments:: Pt follows up with [facility] for annual eye exams   Dietary issues and exercise activities discussed: Current Exercise Habits: Home exercise routine, Type of exercise: Other - see comments (with trainer), Time (Minutes): 60, Frequency (Times/Week): 4, Weekly Exercise (Minutes/Week): 240   Goals Addressed             This Visit's Progress    Patient Stated       Gain more strength and work with trainer        Depression Screen    11/14/2022   10:14 AM 10/31/2022    9:46 AM 10/28/2019    3:09 PM 06/01/2019   11:03 AM 06/02/2017    1:08 PM  PHQ 2/9 Scores  PHQ - 2 Score 0 1 2 2  0  PHQ- 9 Score 0 8 4 14      Fall Risk    11/13/2022   10:51 PM 10/31/2022    9:47 AM 10/28/2019    3:08 PM 06/01/2019   11:01 AM 06/02/2017    1:08 PM  Fall Risk   Falls in the past year? 1 1 0 1 No  Number falls in past yr: 1 1 0 1   Injury with Fall? 0 0 0 0   Risk for fall due to : History of fall(s);Impaired balance/gait;Impaired vision Impaired balance/gait Impaired mobility Impaired balance/gait;Other (Comment)   Risk for fall due to: Comment    Dogs tripped her   Follow up Falls prevention discussed Falls evaluation completed Falls evaluation completed;Education provided;Falls prevention discussed Falls evaluation completed     FALL RISK PREVENTION PERTAINING TO THE HOME:  Any stairs in or around the home? No  If so, are there any without handrails? No  Home free of loose throw rugs in walkways, pet beds, electrical cords, etc? Yes  Adequate lighting in your home to reduce risk of falls? Yes   ASSISTIVE DEVICES UTILIZED TO PREVENT FALLS:  Life alert? No  Use of a cane, walker or w/c? No  Grab bars in the bathroom? No  Shower chair or bench in shower? Yes  Elevated toilet  seat or a handicapped toilet? No   TIMED UP AND GO:  Was the test performed? No .   Cognitive Function:        11/14/2022   10:18 AM 10/28/2019    3:08 PM  6CIT Screen  What Year? 0 points 0 points  What month? 0 points 0 points  What time? 0 points 0 points  Count back from 20 0 points 0 points  Months in reverse 0 points 0 points  Repeat phrase 0 points 0 points  Total Score 0 points 0 points    Immunizations Immunization History  Administered Date(s) Administered   Fluad Quad(high Dose 65+) 05/14/2019   Influenza, High Dose Seasonal PF 07/11/2011, 05/06/2018   Influenza,inj,Quad PF,6+ Mos 05/13/2017, 06/29/2020   Influenza,inj,quad, With Preservative 04/14/2017, 04/14/2018   Influenza-Unspecified 07/02/2021   PFIZER(Purple Top)SARS-COV-2 Vaccination 08/27/2019, 09/21/2019, 06/29/2020   PNEUMOCOCCAL CONJUGATE-20 07/02/2021   Pneumococcal Conjugate-13 05/06/2018   Pneumococcal Polysaccharide-23 07/15/2005   Td 07/15/2005   Zoster Recombinat (Shingrix) 02/13/2022   Zoster, Live 09/30/2012    TDAP status: Due, Education has been provided regarding the importance of this vaccine. Advised may receive this vaccine at local pharmacy or Health Dept. Aware to provide a copy of the vaccination record if  obtained from local pharmacy or Health Dept. Verbalized acceptance and understanding.  Flu Vaccine status: Due, Education has been provided regarding the importance of this vaccine. Advised may receive this vaccine at local pharmacy or Health Dept. Aware to provide a copy of the vaccination record if obtained from local pharmacy or Health Dept. Verbalized acceptance and understanding.  Pneumococcal vaccine status: Up to date  Covid-19 vaccine status: Completed vaccines  Qualifies for Shingles Vaccine? Yes   Zostavax completed Yes   Shingrix Completed?: No.    Education has been provided regarding the importance of this vaccine. Patient has been advised to call insurance company  to determine out of pocket expense if they have not yet received this vaccine. Advised may also receive vaccine at local pharmacy or Health Dept. Verbalized acceptance and understanding.  Screening Tests Health Maintenance  Topic Date Due   DTaP/Tdap/Td (2 - Tdap) 07/16/2015   COVID-19 Vaccine (4 - 2023-24 season) 10/31/2023 (Originally 03/15/2022)   Zoster Vaccines- Shingrix (2 of 2) 10/31/2023 (Originally 04/10/2022)   INFLUENZA VACCINE  02/13/2023   MAMMOGRAM  04/13/2023   Medicare Annual Wellness (AWV)  11/14/2023   COLONOSCOPY (Pts 45-67yrs Insurance coverage will need to be confirmed)  01/21/2029   Pneumonia Vaccine 8+ Years old  Completed   DEXA SCAN  Completed   Hepatitis C Screening  Completed   HPV VACCINES  Aged Out    Health Maintenance  Health Maintenance Due  Topic Date Due   DTaP/Tdap/Td (2 - Tdap) 07/16/2015    Colorectal cancer screening: Type of screening: Colonoscopy. Completed 01/22/19. Repeat every 10 years  Mammogram status: Completed 04/12/21. Repeat every year  Bone Density status: Completed 08/31/21. Results reflect: Bone density results: OSTEOPENIA. Repeat every 2 years.   Additional Screening:  Hepatitis C Screening:  Completed 06/01/19  Vision Screening: Recommended annual ophthalmology exams for early detection of glaucoma and other disorders of the eye. Is the patient up to date with their annual eye exam?  Yes  Who is the provider or what is the name of the office in which the patient attends annual eye exams? [facility]  If pt is not established with a provider, would they like to be referred to a provider to establish care? No .   Dental Screening: Recommended annual dental exams for proper oral hygiene  Community Resource Referral / Chronic Care Management: CRR required this visit?  No   CCM required this visit?  No      Plan:     I have personally reviewed and noted the following in the patient's chart:   Medical and social  history Use of alcohol, tobacco or illicit drugs  Current medications and supplements including opioid prescriptions. Patient is not currently taking opioid prescriptions. Functional ability and status Nutritional status Physical activity Advanced directives List of other physicians Hospitalizations, surgeries, and ER visits in previous 12 months Vitals Screenings to include cognitive, depression, and falls Referrals and appointments  In addition, I have reviewed and discussed with patient certain preventive protocols, quality metrics, and best practice recommendations. A written personalized care plan for preventive services as well as general preventive health recommendations were provided to patient.     Marzella Schlein, LPN   07/20/1094   Nurse Notes: none

## 2022-11-14 NOTE — Patient Instructions (Signed)
Beth Peters , Thank you for taking time to come for your Medicare Wellness Visit. I appreciate your ongoing commitment to your health goals. Please review the following plan we discussed and let me know if I can assist you in the future.   These are the goals we discussed:  Goals      Patient Stated     Gain more strength and work with trainer         This is a list of the screening recommended for you and due dates:  Health Maintenance  Topic Date Due   DTaP/Tdap/Td vaccine (2 - Tdap) 07/16/2015   COVID-19 Vaccine (4 - 2023-24 season) 10/31/2023*   Zoster (Shingles) Vaccine (2 of 2) 10/31/2023*   Flu Shot  02/13/2023   Mammogram  04/13/2023   Medicare Annual Wellness Visit  11/14/2023   Colon Cancer Screening  01/21/2029   Pneumonia Vaccine  Completed   DEXA scan (bone density measurement)  Completed   Hepatitis C Screening: USPSTF Recommendation to screen - Ages 68-79 yo.  Completed   HPV Vaccine  Aged Out  *Topic was postponed. The date shown is not the original due date.    Advanced directives: Please bring a copy of your health care power of attorney and living will to the office at your convenience.  Conditions/risks identified: gain more strength and work with trainer   Next appointment: Follow up in one year for your annual wellness visit    Preventive Care 65 Years and Older, Female Preventive care refers to lifestyle choices and visits with your health care provider that can promote health and wellness. What does preventive care include? A yearly physical exam. This is also called an annual well check. Dental exams once or twice a year. Routine eye exams. Ask your health care provider how often you should have your eyes checked. Personal lifestyle choices, including: Daily care of your teeth and gums. Regular physical activity. Eating a healthy diet. Avoiding tobacco and drug use. Limiting alcohol use. Practicing safe sex. Taking low-dose aspirin every  day. Taking vitamin and mineral supplements as recommended by your health care provider. What happens during an annual well check? The services and screenings done by your health care provider during your annual well check will depend on your age, overall health, lifestyle risk factors, and family history of disease. Counseling  Your health care provider may ask you questions about your: Alcohol use. Tobacco use. Drug use. Emotional well-being. Home and relationship well-being. Sexual activity. Eating habits. History of falls. Memory and ability to understand (cognition). Work and work Astronomer. Reproductive health. Screening  You may have the following tests or measurements: Height, weight, and BMI. Blood pressure. Lipid and cholesterol levels. These may be checked every 5 years, or more frequently if you are over 18 years old. Skin check. Lung cancer screening. You may have this screening every year starting at age 25 if you have a 30-pack-year history of smoking and currently smoke or have quit within the past 15 years. Fecal occult blood test (FOBT) of the stool. You may have this test every year starting at age 53. Flexible sigmoidoscopy or colonoscopy. You may have a sigmoidoscopy every 5 years or a colonoscopy every 10 years starting at age 24. Hepatitis C blood test. Hepatitis B blood test. Sexually transmitted disease (STD) testing. Diabetes screening. This is done by checking your blood sugar (glucose) after you have not eaten for a while (fasting). You may have this done every 1-3 years.  Bone density scan. This is done to screen for osteoporosis. You may have this done starting at age 68. Mammogram. This may be done every 1-2 years. Talk to your health care provider about how often you should have regular mammograms. Talk with your health care provider about your test results, treatment options, and if necessary, the need for more tests. Vaccines  Your health care  provider may recommend certain vaccines, such as: Influenza vaccine. This is recommended every year. Tetanus, diphtheria, and acellular pertussis (Tdap, Td) vaccine. You may need a Td booster every 10 years. Zoster vaccine. You may need this after age 42. Pneumococcal 13-valent conjugate (PCV13) vaccine. One dose is recommended after age 22. Pneumococcal polysaccharide (PPSV23) vaccine. One dose is recommended after age 30. Talk to your health care provider about which screenings and vaccines you need and how often you need them. This information is not intended to replace advice given to you by your health care provider. Make sure you discuss any questions you have with your health care provider. Document Released: 07/28/2015 Document Revised: 03/20/2016 Document Reviewed: 05/02/2015 Elsevier Interactive Patient Education  2017 [place] Prevention in the Home Falls can cause injuries. They can happen to people of all ages. There are many things you can do to make your home safe and to help prevent falls. What can I do on the outside of my home? Regularly fix the edges of walkways and driveways and fix any cracks. Remove anything that might make you trip as you walk through a door, such as a raised step or threshold. Trim any bushes or trees on the path to your home. Use bright outdoor lighting. Clear any walking paths of anything that might make someone trip, such as rocks or tools. Regularly check to see if handrails are loose or broken. Make sure that both sides of any steps have handrails. Any raised decks and porches should have guardrails on the edges. Have any leaves, snow, or ice cleared regularly. Use sand or salt on walking paths during winter. Clean up any spills in your garage right away. This includes oil or grease spills. What can I do in the bathroom? Use night lights. Install grab bars by the toilet and in the tub and shower. Do not use towel bars as grab  bars. Use non-skid mats or decals in the tub or shower. If you need to sit down in the shower, use a plastic, non-slip stool. Keep the floor dry. Clean up any water that spills on the floor as soon as it happens. Remove soap buildup in the tub or shower regularly. Attach bath mats securely with double-sided non-slip rug tape. Do not have throw rugs and other things on the floor that can make you trip. What can I do in the bedroom? Use night lights. Make sure that you have a light by your bed that is easy to reach. Do not use any sheets or blankets that are too big for your bed. They should not hang down onto the floor. Have a firm chair that has side arms. You can use this for support while you get dressed. Do not have throw rugs and other things on the floor that can make you trip. What can I do in the kitchen? Clean up any spills right away. Avoid walking on wet floors. Keep items that you use a lot in easy-to-reach places. If you need to reach something above you, use a strong step stool that has a grab bar.  Keep electrical cords out of the way. Do not use floor polish or wax that makes floors slippery. If you must use wax, use non-skid floor wax. Do not have throw rugs and other things on the floor that can make you trip. What can I do with my stairs? Do not leave any items on the stairs. Make sure that there are handrails on both sides of the stairs and use them. Fix handrails that are broken or loose. Make sure that handrails are as long as the stairways. Check any carpeting to make sure that it is firmly attached to the stairs. Fix any carpet that is loose or worn. Avoid having throw rugs at the top or bottom of the stairs. If you do have throw rugs, attach them to the floor with carpet tape. Make sure that you have a light switch at the top of the stairs and the bottom of the stairs. If you do not have them, ask someone to add them for you. What else can I do to help prevent  falls? Wear shoes that: Do not have high heels. Have rubber bottoms. Are comfortable and fit you well. Are closed at the toe. Do not wear sandals. If you use a stepladder: Make sure that it is fully opened. Do not climb a closed stepladder. Make sure that both sides of the stepladder are locked into place. Ask someone to hold it for you, if possible. Clearly mark and make sure that you can see: Any grab bars or handrails. First and last steps. Where the edge of each step is. Use tools that help you move around (mobility aids) if they are needed. These include: Canes. Walkers. Scooters. Crutches. Turn on the lights when you go into a dark area. Replace any light bulbs as soon as they burn out. Set up your furniture so you have a clear path. Avoid moving your furniture around. If any of your floors are uneven, fix them. If there are any pets around you, be aware of where they are. Review your medicines with your doctor. Some medicines can make you feel dizzy. This can increase your chance of falling. Ask your doctor what other things that you can do to help prevent falls. This information is not intended to replace advice given to you by your health care provider. Make sure you discuss any questions you have with your health care provider. Document Released: 04/27/2009 Document Revised: 12/07/2015 Document Reviewed: 08/05/2014 Elsevier Interactive Patient Education  2017 [employer].

## 2022-12-25 ENCOUNTER — Ambulatory Visit: Payer: PPO | Admitting: Neurology

## 2022-12-25 ENCOUNTER — Encounter: Payer: Self-pay | Admitting: Neurology

## 2022-12-25 VITALS — BP 144/92 | HR 70 | Ht 62.0 in | Wt 154.0 lb

## 2022-12-25 DIAGNOSIS — N951 Menopausal and female climacteric states: Secondary | ICD-10-CM | POA: Diagnosis not present

## 2022-12-25 DIAGNOSIS — F518 Other sleep disorders not due to a substance or known physiological condition: Secondary | ICD-10-CM | POA: Diagnosis not present

## 2022-12-25 DIAGNOSIS — J342 Deviated nasal septum: Secondary | ICD-10-CM | POA: Diagnosis not present

## 2022-12-25 DIAGNOSIS — R5382 Chronic fatigue, unspecified: Secondary | ICD-10-CM | POA: Diagnosis not present

## 2022-12-25 DIAGNOSIS — R0683 Snoring: Secondary | ICD-10-CM | POA: Diagnosis not present

## 2022-12-25 DIAGNOSIS — G4733 Obstructive sleep apnea (adult) (pediatric): Secondary | ICD-10-CM | POA: Diagnosis not present

## 2022-12-25 MED ORDER — GABAPENTIN 100 MG PO CAPS
ORAL_CAPSULE | ORAL | 3 refills | Status: DC
Start: 2022-12-25 — End: 2023-10-23

## 2022-12-25 NOTE — Patient Instructions (Signed)
Healthy Living: Sleep In this video, you will learn why sleep is an important part of a healthy lifestyle. To view the content, go to this web address: [email or web address]  This video will expire on: 09/11/2024. If you need access to this video following this date, please reach out to the healthcare provider who assigned it to you. This information is not intended to replace advice given to you by your health care provider. Make sure you discuss any questions you have with your health care provider. Elsevier Patient Education  2024 [employer].

## 2022-12-25 NOTE — Progress Notes (Signed)
SLEEP MEDICINE CLINIC    Provider:  Melvyn Novas, MD  Primary Care Physician:  Jarold Motto, [place] [address] [place] [address]     Referring Provider: Jarold Motto, [place] [address] [place],  [place] [address]          Chief Complaint according to patient   Patient presents with:     New Patient (Initial Visit)     reports she feels her apneas have worsened since last sleep study in 2020. Has trouble with staying asleep.  Has less trouble to fall asleep, reports vivid dreams, Nocturia, Does not feel refreshed in the morning and has a increase in daytime fatigue.       HISTORY OF PRESENT ILLNESS:  Beth Peters is a 72 y.o. female patient who is seen upon referral on 12/25/2022 from [facility] [place], [place] . Marland Kitchen  Chief concern according to patient :  I feel my sleep has deteriorated,  3 nocturias and difficulties to fall asleep again. Total sleep time has decreased since 3.5 years agio- she reports also vivid dreams, and waking up exhausted from these.dreams    I have the pleasure of seeing Beth Peters 12/25/22  Beth Peters is a 72 y.o. year old Caucasian female patient seen on 07/01/2019 from Helane Rima, DO , now [place], [place]   Last sleep study was dx with mild OSA.  The patient underwent a home sleep test on August 09, 2019 at the time her AHI was 12.2 just mildly accentuated in the REM sleep REM sleep.  And it was very much dependent on sleep position she had a much higher apnea hypopnea index when sleeping on her left side versus right side.  CPAP therapy was optional.  At the time we did not reinforce any CPAP because of the potential treatment with changing the sleep position alone.  Now she feels that her sleep has not improved on the contrary it is actually worse and there is a possibility of course that also apnea has worsened.  So for this reason I think we need to repeat her sleep study.  In the meantime she also had medical attention  for other chronic issues that were already known by the last met these include thyroid disease, hypertension, hyperlipidemia and she has had echo last year or chronic sore throat issues.  Has a history of Raynaud's, of osteopenia, osteoarthritis and lordosis of the lumbar spine.  She is lactose intolerant there is a family history of breast cancer, she had asthma anemia in the past and superficial thrombophlebitis.  Her father had metastatic cancer probably prostate or lung cancer    Chief concern according to patient : both parents and her son have been diagnosed with OSA. she She snores in her sleep, sleeps light and in reclined position due to GERD.  Her father had COPD.    I have the pleasure of seeing Beth Peters today, a right-handed White or Caucasian female with a possible sleep disorder.  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   She lives  in a household with her spouse and dog.  Two adult children, a son in [place], [place] near [place],  and a daughter in [place].  2 grandchildren yet.  Father had severe OSA, son has severe OSA,    Tobacco use; never - father was a heavy smoker and she had passive exposure.   ETOH use: socially - white wine, takes nexium- Caffeine intake in form of Coffee( 1 in AM ) Soda( none) Tea ( none) or energy drinks. Regular exercise in form of aquatics , before Covid she did some weight training.      Sleep habits are as follows: The patient's dinner time is between 6-7  PM. The patient goes to bed at 10 PM and reads- she will fall asleep by 10.30 PM ,continues to sleep for several hours - she has to wake for 3 bathroom breaks, the first time at 3-4 AM.   She reads in bed keeps her bedroom cool, quiet and dark.  The preferred sleep position is reclined with the support of 2 pillows. Dreams are reportedly frequently and  increasingly more vividly.   She snores, she gasps.  7  AM is the usual rise time. The patient wakes up spontaneously at 4-5 AM, sometimes she gets more sleep, somtimes not. He/ She reports not feeling refreshed or restored in AM, with symptoms such as dry mouth, she drinks water all night,   and residual fatigue. Naps are taken infrequently.  Review of Systems: Out of a complete 14 system review, the patient complains of only the following symptoms, and all other reviewed systems are negative.:   Very loud snoring.  Fatigue, sleepiness , snoring, fragmented sleep, VOVID Dreams,  no sleep paralysis. Dream hallucinations.  Dreams in installments.  Nocturia 3 times !    How likely are you to doze in the following situations: 0 = not likely, 1 = slight chance, 2 = moderate chance, 3 = high chance   Sitting and Reading? Watching Television? Sitting inactive in a public place (theater or meeting)? As a passenger in a car for an hour without a break? Lying down in the afternoon when circumstances permit? Sitting and talking to someone? Sitting quietly after lunch without alcohol? In a car, while stopped for a few minutes in traffic?   Total = 8/ 24 points    FSS endorsed at 43/ 63 points.  GDS 4/ 15   Social History   Socioeconomic History   Marital status: Married    Spouse name: Not on file   Number of children: 2   Years of education: Not on file   Highest education level: Bachelor's degree (e.g., BA, AB, BS)  Occupational History   Occupation: Retired     Comment: Social research officer, government   Tobacco Use   Smoking status: Never   Smokeless  tobacco: Never  Vaping Use   Vaping Use: Never used  Substance and Sexual Activity   Alcohol use: Yes    Alcohol/week: 8.0 standard drinks of alcohol    Types: 8 Glasses of wine per week   Drug use: No   Sexual activity: Yes    Partners: Male    Birth control/protection: Post-menopausal, Surgical    Comment: vasectomy, hysterectomy  Other Topics Concern   Not on file  Social History Narrative   Daughter getting married Spring 2021; 1st grandchild due in June       Hobbies: Oil painting and antiquing    Social Determinants of Health   Financial Resource Strain:  Low Risk  (11/13/2022)   Overall Financial Resource Strain (CARDIA)    Difficulty of Paying Living Expenses: Not hard at all  Food Insecurity: No Food Insecurity (11/13/2022)   Hunger Vital Sign    Worried About Running Out of Food in the Last Year: Never true    Ran Out of Food in the Last Year: Never true  Transportation Needs: No Transportation Needs (11/13/2022)   PRAPARE - Administrator, Civil Service (Medical): No    Lack of Transportation (Non-Medical): No  Physical Activity: Sufficiently Active (11/13/2022)   Exercise Vital Sign    Days of Exercise per Week: 4 days    Minutes of Exercise per Session: 60 min  Stress: No Stress Concern Present (11/13/2022)   [employer] of Occupational Health - Occupational Stress Questionnaire    Feeling of Stress : Only a little  Social Connections: Socially Integrated (11/13/2022)   Social Connection and Isolation Panel [NHANES]    Frequency of Communication with Friends and Family: More than three times a week    Frequency of Social Gatherings with Friends and Family: Twice a week    Attends Religious Services: More than 4 times per year    Active Member of [employer] or Organizations: Yes    Attends Engineer, structural: More than 4 times per year    Marital Status: Married    Family History  Problem Relation Age of Onset   Hypertension Father    Heart  disease Father    Stroke Father    Cancer Father        Testicular, Bladder, Prostate, Lung   Heart disease Mother    Stroke Mother    Osteoporosis Mother    Uterine cancer Sister 39   Breast cancer Sister    Cancer Sister        rectal, liver,lymph nodes   Diabetes Maternal Grandfather    Heart disease Maternal Grandfather    Diabetes Paternal Grandmother    Breast cancer Maternal Aunt     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   OA (osteoarthritis)    Osteopenia    PONV (postoperative nausea and vomiting)    Raynauds syndrome    Seasonal affective disorder (HCC)    SUI (stress urinary incontinence, female)    Superficial thrombophlebitis    x2  left lower leg superficial in  2006  prior to vein stripping     Past Surgical History:  Procedure Laterality Date   CYSTOSCOPY N/A 07/14/2018   Procedure: CYSTOSCOPY;  Surgeon: Romualdo Bolk, MD;  Location: [facility] [place];  Service: Gynecology;  Laterality: N/A;   D & C HYSTEROSCOPY W/ RESECTION FIBROID  09-21-2002  dr Maggie Schwalbe  [facility]   D & C HYSTEROSCOPY W/ RESECTION POLYP  12-24-2010  dr Tresa Res  [facility]   DILATION AND CURETTAGE OF UTERUS     ESOPHAGEAL DILATION  2013  --- [facility]   Omega dilation for Achalasia   ESOPHAGOGASTRODUODENOSCOPY N/A 09/20/2017   Procedure: ESOPHAGOGASTRODUODENOSCOPY (EGD);  Surgeon: Vida Rigger, MD;  Location: [facility];  Service: Endoscopy;  Laterality: N/A;   HELLER MYOTOMY  2006   fundoplication repair  hiatal hernia   KNEE ARTHROSCOPY Left 2006 &  09-27-2003   KNEE ARTHROSCOPY W/ SYNOVECTOMY Right 09-20-2009  dr Lequita Halt  [facility]   KNEE SURGERY Right 1962   RECTOCELE REPAIR N/A 07/14/2018   Procedure: POSTERIOR REPAIR (RECTOCELE);   Surgeon: Romualdo Bolk, MD;  Location: [facility];  Service: Gynecology;  Laterality: N/A;   ROTATOR CUFF REPAIR Right 09/2011   skin cancker  01/12/2022   face   TONSILLECTOMY AND ADENOIDECTOMY  1961   TOTAL KNEE ARTHROPLASTY Bilateral left 02-05-2006 ;  right 08-14-2007   dr Lequita Halt  [facility]   TOTAL KNEE REVISION Right 11/26/2017   Procedure: Right knee polyethylene revision;  Surgeon: Ollen Gross, MD;  Location: [facility];  Service: Orthopedics;  Laterality: Right;  Adductor Block   VARICOSE VEIN SURGERY  09/2005     Current Outpatient Medications on File Prior to Visit  Medication Sig Dispense Refill   buPROPion (WELLBUTRIN XL) 150 MG 24 hr tablet TAKE 1 TABLET BY MOUTH EVERY DAY 90 tablet 0   CALCIUM PO Take 15 mLs by mouth daily.     Cholecalciferol (VITAMIN D3) 1000 units CAPS Take 1,000 Units by mouth daily.     diclofenac Sodium (VOLTAREN) 1 % GEL Voltaren 1 % topical gel     docusate sodium (COLACE) 100 MG capsule Take 100 mg by mouth 2 (two) times daily as needed for mild constipation.     EPINEPHrine 0.3 mg/0.3 mL IJ SOAJ injection Inject 0.3 mg into the muscle as needed for anaphylaxis. 1 each 1   esomeprazole (NEXIUM) 40 MG capsule Take 40 mg by mouth See admin instructions. Take 40 mg by mouth daily. May take additional 40 mg if needed for acid reflux     fluticasone-salmeterol (WIXELA INHUB) 250-50 MCG/ACT AEPB Inhale 1 puff into the lungs in the morning and at bedtime. 1 each 2   gabapentin (NEURONTIN) 100 MG capsule TAKE 3 CAPSULE BY MOUTH AT BEDTIME and one capsule in the am. 360 capsule 3   hydrochlorothiazide (HYDRODIURIL) 25 MG tablet Take 1 tablet (25 mg total) by mouth daily. 90 tablet 3   venlafaxine XR (EFFEXOR-XR) 37.5 MG 24 hr capsule 1 po qd 90 capsule 3   No current facility-administered medications on file prior to visit.    Allergies  Allergen Reactions   Dilaudid [Hydromorphone Hcl] Hives, Shortness Of Breath and Itching   Glucagon  Anaphylaxis   Lactose Intolerance (Gi)    Wasp Venom Itching and Swelling   Wasp Venom Protein Itching and Hives     DIAGNOSTIC DATA (LABS, IMAGING, TESTING) - I reviewed patient records, labs, notes, testing and imaging myself where available.  Lab Results  Component Value Date   WBC 5.6 10/31/2022   HGB 14.5 10/31/2022   HCT 43.0 10/31/2022   MCV 91.9 10/31/2022   PLT 243.0 10/31/2022      Component Value Date/Time   NA 139 10/31/2022 1012   K 3.9 10/31/2022 1012   CL 97 10/31/2022 1012   CO2 34 (H) 10/31/2022 1012   GLUCOSE 90 10/31/2022 1012   BUN 13 10/31/2022 1012   CREATININE 0.80 10/31/2022 1012   CALCIUM 9.7 10/31/2022 1012   PROT 6.5 10/31/2022 1012   ALBUMIN 4.3 10/31/2022 1012   AST 15 10/31/2022 1012   ALT 15 10/31/2022 1012   ALKPHOS 66 10/31/2022 1012   BILITOT 0.5 10/31/2022 1012   GFRNONAA >60 07/09/2018 1008   GFRAA >60 07/09/2018 1008   Lab Results  Component Value Date   CHOL 276 (H) 01/25/2022   HDL 77.00 01/25/2022   LDLCALC 176 (H) 01/25/2022   TRIG 112.0 01/25/2022   CHOLHDL 4 01/25/2022   Lab Results  Component Value Date   HGBA1C 5.4 06/02/2017   Lab Results  Component Value Date   VITAMINB12 328 10/31/2022   Lab Results  Component Value Date   TSH 1.03 10/31/2022    PHYSICAL EXAM:  Today's Vitals   12/25/22 1514 12/25/22 1524  BP: (!) 144/90 (!) 144/92  Pulse: 69 70  Weight: 154 lb (69.9 kg)   Height: 5\' 2"  (1.575 m)    Body mass index is 28.17 kg/m.   Wt Readings from Last 3 Encounters:  12/25/22 154 lb (69.9 kg)  11/14/22 150 lb (68 kg)  10/31/22 156 lb (70.8 kg)     Ht Readings from Last 3 Encounters:  12/25/22 5\' 2"  (1.575 m)  10/31/22 5\' 2"  (1.575 m)  05/10/22 5\' 2"  (1.575 m)      General:  The patient is awake, alert and appears not in acute distress. The patient is well groomed. Head: Normocephalic, atraumatic. Neck is supple.  Mallampati; 3 - small  Mouth, teeth are showing signs of bruxism.  neck  circumference:14 inches . Nasal airflow patent.  Retrognathia is not seen.    Cardiovascular:  Regular rate and cardiac rhythm by pulse,  without distended neck veins. Respiratory: Lungs are clear to auscultation.  Skin:  With evidence of ankle edema, but not  rash. Trunk: The patient's posture is erect.   Neurologic exam : The patient is awake and alert, oriented to place and time.   Memory subjective described as intact.  Attention span & concentration ability appears normal.  Speech is fluent,  without  dysarthria, dysphonia or aphasia.  Mood and affect are appropriate.   Cranial nerves: no loss of smell or taste reported  Pupils are equal and briskly reactive to light. Funduscopic exam deferred..  Extraocular movements in vertical and horizontal planes were intact and without nystagmus. No Diplopia. Visual fields by finger perimetry are intact. Hearing was intact to soft voice and finger rubbing.    Facial sensation intact to fine touch.  Facial motor strength is symmetric and tongue and uvula move midline.  Neck ROM : rotation, tilt and flexion extension were normal for age and shoulder shrug was symmetrical.    Motor exam:  Symmetric bulk, tone and ROM.   Normal tone without cog wheeling, symmetric grip strength .   Sensory:  Fine touch, pinprick and vibration were tested  and  normal.  Proprioception tested in the upper extremities was normal.   Coordination: Rapid alternating movements in the fingers/hands were of normal speed.  The Finger-to-nose maneuver was intact without evidence of ataxia, dysmetria or tremor.   Gait and station:  status post 2 knee replacements. Patient could rise unassisted from a seated position.   Deep tendon reflexes: in the  upper and lower extremities are symmetric and intact.  Babinski response was deferred .       ASSESSMENT AND PLAN 72 y.o. year old female  here with:    1) increasing fatigue, increasing snoring Volume, feels  exhausted, reports fragmented sleep due to Nocturia and vivid dreams.   2) worsening sleep apnea/ She was tested positive for MILD OSA in 07-2019.  HST repeat ordered, will follow up after  therapy is initiated , if on CPAP will follow after  30- 80 days of therapy.  I plan to follow up either personally or through our NP within 4 months.   I would like to thank Jarold Motto, PA for allowing me to meet with and to take care of this pleasant patient.    After spending a total time of  35  minutes face to face and additional time for physical and neurologic examination, review of laboratory studies,  personal review of imaging studies, reports and results of other testing and review of referral information / records as far as provided in visit,   Electronically signed by: Melvyn Novas, MD 12/25/2022 3:28 PM  [facility] Board certified by The [employer] of Sleep Medicine and Diplomate of the [employer] of Sleep Medicine. Board certified In Neurology through the ABPN, Fellow of the [employer] of Neurology.

## 2023-01-01 ENCOUNTER — Telehealth: Payer: Self-pay | Admitting: Neurology

## 2023-01-01 NOTE — Telephone Encounter (Signed)
HTA Pending faxed notes  

## 2023-01-06 NOTE — Telephone Encounter (Signed)
HST- HTA Berkley Harvey: [id number] (exp. 01/01/23 to 04/01/23)

## 2023-01-25 ENCOUNTER — Other Ambulatory Visit: Payer: Self-pay | Admitting: Physician Assistant

## 2023-01-30 NOTE — Telephone Encounter (Signed)
01/23/23 LVM was left  01/07/23 LVM KS 01/06/23 HTA auth: [id number] (exp. 01/01/23 to 04/01/23) EE

## 2023-02-23 ENCOUNTER — Other Ambulatory Visit: Payer: Self-pay | Admitting: Physician Assistant

## 2023-02-25 ENCOUNTER — Encounter: Payer: Self-pay | Admitting: Physician Assistant

## 2023-02-25 ENCOUNTER — Ambulatory Visit: Payer: PPO | Admitting: Physician Assistant

## 2023-02-25 VITALS — BP 144/90 | HR 85 | Temp 97.8°F | Ht 62.0 in | Wt 149.5 lb

## 2023-02-25 DIAGNOSIS — J029 Acute pharyngitis, unspecified: Secondary | ICD-10-CM | POA: Diagnosis not present

## 2023-02-25 LAB — POCT RAPID STREP A (OFFICE): Rapid Strep A Screen: NEGATIVE

## 2023-02-25 MED ORDER — AZITHROMYCIN 250 MG PO TABS: ORAL_TABLET | ORAL | 0 refills | Status: AC

## 2023-02-25 MED ORDER — GUAIFENESIN-CODEINE 100-10 MG/5ML PO SYRP: 5.0000 mL | ORAL_SOLUTION | Freq: Three times a day (TID) | ORAL | 0 refills | Status: DC | PRN

## 2023-02-25 NOTE — Patient Instructions (Signed)
It was great to see you!  May use Cheratussin cough syrup -- if any allergic reactions -- stop and seek medical attention, however you have been prescribed this in the past and tolerated well  Start B-12 nurse visits when feeling better  Take care,  Jarold Motto PA-C

## 2023-02-25 NOTE — Progress Notes (Signed)
Beth Peters is a 72 y.o. female here for a new problem.  History of Present Illness:   Chief Complaint  Patient presents with   Sore Throat    Pt c/o sore throat started last Tuesday, cough and congestion, expectorating yellow/brown, nasal drainage yellow, headache. COVID test done on Saturday was Negative.    HPI  Sinus Infection: Complains of cough, congestion, expectorating yellow/brown sputum, nasal drainage (yellow), headache, sinus pain, ear swelling and drainage, and sore throat that began a week ago. Endorses worsening symptoms over time, and voice loss for two days due to throat soreness.  States she has been using Nyquil and Dayquil Severe Cold and Flu since Thursday.  Reports her daughter also has had sinus infection.  Administered Covid test on Saturday; resulted negative. Strep test administered today resulted negative.  Daughter sick. Denies fevers, chills, chest pain, shortness of breath.   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   OA (osteoarthritis)    Osteopenia    PONV (postoperative nausea and vomiting)    Raynauds syndrome    Seasonal affective disorder (HCC)    SUI (stress urinary incontinence, female)    Superficial thrombophlebitis    x2  left lower leg superficial in  2006  prior to vein stripping      Social History   Tobacco Use   Smoking status: Never   Smokeless tobacco: Never  Vaping Use   Vaping status: Never Used  Substance Use Topics   Alcohol use: Yes    Alcohol/week: 8.0 standard drinks of alcohol    Types: 8 Glasses of wine per week   Drug use: No    Past Surgical History:  Procedure Laterality Date   CYSTOSCOPY N/A 07/14/2018   Procedure: CYSTOSCOPY;  Surgeon: Romualdo Bolk, MD;  Location: [facility];  Service: Gynecology;  Laterality: N/A;   D & C HYSTEROSCOPY W/ RESECTION FIBROID  09-21-2002  dr Maggie Schwalbe  [facility]   D & C HYSTEROSCOPY W/ RESECTION POLYP  12-24-2010  dr Tresa Res  [facility]   DILATION AND CURETTAGE OF UTERUS     ESOPHAGEAL DILATION  2013  --- [facility]   Omega dilation for Achalasia   ESOPHAGOGASTRODUODENOSCOPY N/A 09/20/2017   Procedure: ESOPHAGOGASTRODUODENOSCOPY (EGD);  Surgeon: Vida Rigger, MD;  Location: [facility];  Service: Endoscopy;  Laterality: N/A;   HELLER MYOTOMY  2006   fundoplication repair hiatal hernia   KNEE ARTHROSCOPY Left 2006 &  09-27-2003   KNEE ARTHROSCOPY W/ SYNOVECTOMY Right 09-20-2009  dr Lequita Halt  [facility]   KNEE SURGERY Right 1962   RECTOCELE REPAIR N/A 07/14/2018   Procedure: POSTERIOR REPAIR (RECTOCELE);  Surgeon: Romualdo Bolk, MD;  Location[facility];  Service: Gynecology;  Laterality: N/A;   ROTATOR CUFF REPAIR Right 09/2011   skin cancker  01/12/2022   face   TONSILLECTOMY AND ADENOIDECTOMY  1961   TOTAL KNEE ARTHROPLASTY Bilateral left 02-05-2006 ;  right 08-14-2007   dr Lequita Halt  [facility]   TOTAL KNEE REVISION Right 11/26/2017   Procedure: Right knee polyethylene revision;  Surgeon: Ollen Gross, MD;  Location: [facility];  Service: Orthopedics;  Laterality: Right;  Adductor Block  VARICOSE VEIN SURGERY  09/2005    Family History  Problem Relation Age of Onset   Hypertension Father    Heart disease Father    Stroke Father    Cancer Father        Testicular, Bladder, Prostate, Lung   Heart disease Mother    Stroke Mother    Osteoporosis Mother    Uterine cancer Sister 5   Breast cancer Sister    Cancer Sister        rectal, liver,lymph nodes   Diabetes Maternal Grandfather    Heart disease Maternal Grandfather    Diabetes Paternal Grandmother    Breast cancer Maternal Aunt     Allergies  Allergen Reactions   Dilaudid [Hydromorphone Hcl] Hives, Shortness Of Breath  and Itching   Glucagon Anaphylaxis   Lactose Intolerance (Gi)    Wasp Venom Itching and Swelling   Wasp Venom Protein Itching and Hives    Current Medications:   Current Outpatient Medications:    azithromycin (ZITHROMAX) 250 MG tablet, Take 2 tablets on day 1, then 1 tablet daily on days 2 through 5, Disp: 6 tablet, Rfl: 0   buPROPion (WELLBUTRIN XL) 150 MG 24 hr tablet, TAKE 1 TABLET BY MOUTH EVERY DAY, Disp: 90 tablet, Rfl: 0   CALCIUM PO, Take 15 mLs by mouth daily., Disp: , Rfl:    Cholecalciferol (VITAMIN D3) 1000 units CAPS, Take 1,000 Units by mouth daily., Disp: , Rfl:    diclofenac Sodium (VOLTAREN) 1 % GEL, Voltaren 1 % topical gel, Disp: , Rfl:    docusate sodium (COLACE) 100 MG capsule, Take 100 mg by mouth 2 (two) times daily as needed for mild constipation., Disp: , Rfl:    EPINEPHrine 0.3 mg/0.3 mL IJ SOAJ injection, Inject 0.3 mg into the muscle as needed for anaphylaxis., Disp: 1 each, Rfl: 1   esomeprazole (NEXIUM) 40 MG capsule, Take 40 mg by mouth See admin instructions. Take 40 mg by mouth daily. May take additional 40 mg if needed for acid reflux, Disp: , Rfl:    gabapentin (NEURONTIN) 100 MG capsule, TAKE 3 CAPSULE BY MOUTH AT BEDTIME and one capsule in the am., Disp: 360 capsule, Rfl: 3   guaiFENesin-codeine (ROBITUSSIN AC) 100-10 MG/5ML syrup, Take 5 mLs by mouth 3 (three) times daily as needed for cough., Disp: 120 mL, Rfl: 0   hydrochlorothiazide (HYDRODIURIL) 25 MG tablet, Take 1 tablet (25 mg total) by mouth daily., Disp: 90 tablet, Rfl: 3   venlafaxine XR (EFFEXOR-XR) 37.5 MG 24 hr capsule, 1 po qd, Disp: 90 capsule, Rfl: 3   WIXELA INHUB 250-50 MCG/ACT AEPB, INHALE 1 PUFF INTO THE LUNGS IN THE MORNING AND AT BEDTIME., Disp: 60 each, Rfl: 2   Review of Systems:   Review of Systems  HENT:  Positive for congestion, ear discharge, sinus pain and sore throat.        (+) Ear swelling  (+) Nasal drainage (yellow) (+) Loss of voice (for 2 days)  Respiratory:   Positive for cough and sputum production (yellow/brown).   Neurological:  Positive for headaches.    Vitals:   Vitals:   02/25/23 1145 02/25/23 1207  BP: (!) 154/96 (!) 144/90  Pulse: 85   Temp: 97.8 F (36.6 C)   TempSrc: Temporal   SpO2: 99%   Weight: 149 lb 8 oz (67.8 kg)   Height: 5\' 2"  (1.575 m)      Body mass index is 27.34 kg/m.  Physical Exam:   Physical  Exam Vitals and nursing note reviewed.  Constitutional:      General: She is not in acute distress.    Appearance: She is well-developed. She is not ill-appearing or toxic-appearing.  HENT:     Head: Normocephalic and atraumatic.     Right Ear: Tympanic membrane, ear canal and external ear normal. Tympanic membrane is not erythematous, retracted or bulging.     Left Ear: Tympanic membrane, ear canal and external ear normal. Tympanic membrane is not erythematous, retracted or bulging.     Nose: Nose normal.     Right Sinus: No maxillary sinus tenderness or frontal sinus tenderness.     Left Sinus: No maxillary sinus tenderness or frontal sinus tenderness.     Mouth/Throat:     Pharynx: Uvula midline. Posterior oropharyngeal erythema present.     Tonsils: 1+ on the right. 1+ on the left.  Eyes:     General: Lids are normal.     Conjunctiva/sclera: Conjunctivae normal.  Neck:     Trachea: Trachea normal.  Cardiovascular:     Rate and Rhythm: Normal rate and regular rhythm.     Heart sounds: Normal heart sounds, S1 normal and S2 normal.  Pulmonary:     Effort: Pulmonary effort is normal.     Breath sounds: Normal breath sounds. No decreased breath sounds, wheezing, rhonchi or rales.  Lymphadenopathy:     Cervical: No cervical adenopathy.  Skin:    General: Skin is warm and dry.  Neurological:     Mental Status: She is alert.  Psychiatric:        Speech: Speech normal.        Behavior: Behavior normal. Behavior is cooperative.    Results for orders placed or performed in visit on 02/25/23  POCT rapid  strep A  Result Value Ref Range   Rapid Strep A Screen Negative Negative    Assessment and Plan:   Sore throat No red flags on exam.  Suspect sinusitis. Will initiate azithromycin per orders and patient request. Cheratussin sent in - has tolerated this in the past without any significant reactions. Discussed if she develops any reactions to stop medication and go to the ER. Do not drive after taking this medication. Discussed taking medications as prescribed. Reviewed return precautions including  fever, SOB, worsening cough or other concerns. Push fluids and rest. I recommend that patient follow-up if symptoms worsen or persist despite treatment x 7-10 days, sooner if needed.  I,Emily Lagle,acting as a Neurosurgeon for [employer], PA.,have documented all relevant documentation on the behalf of Jarold Motto, PA,as directed by  Jarold Motto, PA while in the presence of Jarold Motto, [place].  I, Jarold Motto, [place], have reviewed all documentation for this visit. The documentation on 02/25/23 for the exam, diagnosis, procedures, and orders are all accurate and complete.  Jarold Motto, PA-C

## 2023-04-13 ENCOUNTER — Encounter (HOSPITAL_COMMUNITY)
Admission: EM | Disposition: A | Discharge status: Home / Self Care | Payer: Self-pay | Source: Home / Self Care | Attending: Emergency Medicine

## 2023-04-13 ENCOUNTER — Ambulatory Visit (HOSPITAL_COMMUNITY)
Admission: EM | Admit: 2023-04-13 | Discharge: 2023-04-14 | Disposition: A | Discharge status: Home / Self Care | Payer: PPO | Attending: Emergency Medicine | Admitting: Emergency Medicine

## 2023-04-13 ENCOUNTER — Other Ambulatory Visit: Payer: Self-pay

## 2023-04-13 ENCOUNTER — Emergency Department (HOSPITAL_COMMUNITY): Payer: PPO | Admitting: Certified Registered"

## 2023-04-13 ENCOUNTER — Encounter (HOSPITAL_COMMUNITY): Payer: Self-pay | Admitting: *Deleted

## 2023-04-13 ENCOUNTER — Emergency Department (HOSPITAL_BASED_OUTPATIENT_CLINIC_OR_DEPARTMENT_OTHER): Payer: PPO | Admitting: Certified Registered"

## 2023-04-13 DIAGNOSIS — F32A Depression, unspecified: Secondary | ICD-10-CM | POA: Diagnosis not present

## 2023-04-13 DIAGNOSIS — I10 Essential (primary) hypertension: Secondary | ICD-10-CM | POA: Insufficient documentation

## 2023-04-13 DIAGNOSIS — T18128A Food in esophagus causing other injury, initial encounter: Secondary | ICD-10-CM

## 2023-04-13 DIAGNOSIS — J45909 Unspecified asthma, uncomplicated: Secondary | ICD-10-CM | POA: Insufficient documentation

## 2023-04-13 DIAGNOSIS — X58XXXA Exposure to other specified factors, initial encounter: Secondary | ICD-10-CM | POA: Insufficient documentation

## 2023-04-13 DIAGNOSIS — R1310 Dysphagia, unspecified: Secondary | ICD-10-CM | POA: Diagnosis not present

## 2023-04-13 DIAGNOSIS — K220 Achalasia of cardia: Secondary | ICD-10-CM | POA: Insufficient documentation

## 2023-04-13 DIAGNOSIS — G4730 Sleep apnea, unspecified: Secondary | ICD-10-CM | POA: Insufficient documentation

## 2023-04-13 DIAGNOSIS — T18108A Unspecified foreign body in esophagus causing other injury, initial encounter: Secondary | ICD-10-CM | POA: Diagnosis not present

## 2023-04-13 DIAGNOSIS — R0789 Other chest pain: Secondary | ICD-10-CM | POA: Diagnosis not present

## 2023-04-13 DIAGNOSIS — K222 Esophageal obstruction: Secondary | ICD-10-CM | POA: Insufficient documentation

## 2023-04-13 DIAGNOSIS — W44F3XA Food entering into or through a natural orifice, initial encounter: Secondary | ICD-10-CM | POA: Diagnosis not present

## 2023-04-13 DIAGNOSIS — R9431 Abnormal electrocardiogram [ECG] [EKG]: Secondary | ICD-10-CM | POA: Diagnosis not present

## 2023-04-13 HISTORY — PX: ESOPHAGOGASTRODUODENOSCOPY (EGD) WITH PROPOFOL: SHX5813

## 2023-04-13 HISTORY — PX: FOREIGN BODY REMOVAL: SHX962

## 2023-04-13 LAB — I-STAT CHEM 8, ED
BUN: 9 mg/dL (ref 8–23)
Calcium, Ion: 1.14 mmol/L — ABNORMAL LOW (ref 1.15–1.40)
Chloride: 98 mmol/L (ref 98–111)
Creatinine, Ser: 0.9 mg/dL (ref 0.44–1.00)
Glucose, Bld: 96 mg/dL (ref 70–99)
HCT: 41 % (ref 36.0–46.0)
Hemoglobin: 13.9 g/dL (ref 12.0–15.0)
Potassium: 3.4 mmol/L — ABNORMAL LOW (ref 3.5–5.1)
Sodium: 137 mmol/L (ref 135–145)
TCO2: 27 mmol/L (ref 22–32)

## 2023-04-13 SURGERY — ESOPHAGOGASTRODUODENOSCOPY (EGD) WITH PROPOFOL
Anesthesia: Monitor Anesthesia Care

## 2023-04-13 MED ORDER — DEXAMETHASONE SODIUM PHOSPHATE 10 MG/ML IJ SOLN
INTRAMUSCULAR | Status: DC | PRN
  Administered 2023-04-13: 4 mg via INTRAVENOUS

## 2023-04-13 MED ORDER — PROPOFOL 10 MG/ML IV BOLUS
INTRAVENOUS | Status: DC | PRN
  Administered 2023-04-13: 160 mg via INTRAVENOUS

## 2023-04-13 MED ORDER — FENTANYL CITRATE (PF) 100 MCG/2ML IJ SOLN
INTRAMUSCULAR | Status: AC
  Filled 2023-04-13: qty 2

## 2023-04-13 MED ORDER — NITROGLYCERIN 0.4 MG SL SUBL
0.4000 mg | SUBLINGUAL_TABLET | Freq: Once | SUBLINGUAL | Status: AC
  Administered 2023-04-13: 0.4 mg via SUBLINGUAL
  Filled 2023-04-13: qty 1

## 2023-04-13 MED ORDER — FENTANYL CITRATE (PF) 100 MCG/2ML IJ SOLN
INTRAMUSCULAR | Status: DC | PRN
  Administered 2023-04-13: 50 ug via INTRAVENOUS

## 2023-04-13 MED ORDER — PROPOFOL 10 MG/ML IV BOLUS
INTRAVENOUS | Status: AC
  Filled 2023-04-13: qty 20

## 2023-04-13 MED ORDER — LACTATED RINGERS IV SOLN
INTRAVENOUS | Status: AC | PRN
  Administered 2023-04-13: 1000 mL via INTRAVENOUS

## 2023-04-13 MED ORDER — ONDANSETRON HCL 4 MG/2ML IJ SOLN
INTRAMUSCULAR | Status: DC | PRN
Start: 2023-04-13 — End: 2023-04-13
  Administered 2023-04-13: 4 mg via INTRAVENOUS

## 2023-04-13 MED ORDER — SUCCINYLCHOLINE CHLORIDE 200 MG/10ML IV SOSY
PREFILLED_SYRINGE | INTRAVENOUS | Status: DC | PRN
  Administered 2023-04-13: 140 mg via INTRAVENOUS

## 2023-04-13 MED ORDER — LIDOCAINE 2% (20 MG/ML) 5 ML SYRINGE
INTRAMUSCULAR | Status: DC | PRN
  Administered 2023-04-13: 160 mg via INTRAVENOUS

## 2023-04-13 SURGICAL SUPPLY — 15 items

## 2023-04-13 NOTE — ED Provider Notes (Signed)
[place] EMERGENCY DEPARTMENT AT [facility] Provider Note   CSN: [id number] Arrival date & time: 04/13/23  2115     History  No chief complaint on file.   Beth Peters is a 72 y.o. female.  72 year old female with prior history as detailed below presents for evaluation.  Patient with known history of achalasia and multiple prior food impactions.  Patient reports that she ate steak around 745.  She felt like the steak was stuck in her esophagus.  Patient is known to [facility].  She contacted [facility]) prior to coming to the [facility].  She reports anaphylaxis with glucagon administration.  Sublingual nitroglycerin did not improve her symptoms.  The history is provided by the patient and medical records.       Home Medications Prior to Admission medications   Medication Sig Start Date End Date Taking? Authorizing Provider  buPROPion (WELLBUTRIN XL) 150 MG 24 hr tablet TAKE 1 TABLET BY MOUTH EVERY DAY 01/27/23   Jarold Motto, PA  CALCIUM PO Take 15 mLs by mouth daily.    [provider]  Cholecalciferol (VITAMIN D3) 1000 units CAPS Take 1,000 Units by mouth daily.    [provider]  diclofenac Sodium (VOLTAREN) 1 % GEL Voltaren 1 % topical gel    [provider]  docusate sodium (COLACE) 100 MG capsule Take 100 mg by mouth 2 (two) times daily as needed for mild constipation.    [provider]  EPINEPHrine 0.3 mg/0.3 mL IJ SOAJ injection Inject 0.3 mg into the muscle as needed for anaphylaxis. 10/31/22   Jarold Motto, PA  esomeprazole (NEXIUM) 40 MG capsule Take 40 mg by mouth See admin instructions. Take 40 mg by mouth daily. May take additional 40 mg if needed for acid reflux    [provider]  gabapentin (NEURONTIN) 100 MG capsule TAKE 3 CAPSULE BY MOUTH AT BEDTIME and one capsule in the am. 12/25/22   Dohmeier, Porfirio Mylar, MD  guaiFENesin-codeine (ROBITUSSIN AC) 100-10 MG/5ML syrup Take 5 mLs by  mouth 3 (three) times daily as needed for cough. 02/25/23   Jarold Motto, PA  hydrochlorothiazide (HYDRODIURIL) 25 MG tablet Take 1 tablet (25 mg total) by mouth daily. 10/31/22   Jarold Motto, PA  venlafaxine XR (EFFEXOR-XR) 37.5 MG 24 hr capsule 1 po qd 04/15/22   Romualdo Bolk, MD  Monte Fantasia INHUB 250-50 MCG/ACT AEPB INHALE 1 PUFF INTO THE LUNGS IN THE MORNING AND AT BEDTIME. 02/24/23   Jarold Motto, PA      Allergies    Dilaudid [hydromorphone hcl], Glucagon, Lactose intolerance (gi), Wasp venom, and Wasp venom protein    Review of Systems   Review of Systems  All other systems reviewed and are negative.   Physical Exam Updated Vital Signs BP 134/86   Pulse 81   Temp (!) 97.1 F (36.2 C) (Oral)   Resp 12   Ht 5\' 3"  (1.6 m)   Wt 65.8 kg   LMP 09/23/2012   SpO2 94%   BMI 25.69 kg/m  Physical Exam Vitals and nursing note reviewed.  Constitutional:      General: She is not in acute distress.    Appearance: Normal appearance. She is well-developed.  HENT:     Head: Normocephalic and atraumatic.  Eyes:     Conjunctiva/sclera: Conjunctivae normal.     Pupils: Pupils are equal, round, and reactive to light.  Cardiovascular:     Rate and Rhythm: Normal rate and  regular rhythm.     Heart sounds: Normal heart sounds.  Pulmonary:     Effort: Pulmonary effort is normal. No respiratory distress.     Breath sounds: Normal breath sounds.  Abdominal:     General: There is no distension.     Palpations: Abdomen is soft.     Tenderness: There is no abdominal tenderness.  Musculoskeletal:        General: No deformity. Normal range of motion.     Cervical back: Normal range of motion and neck supple.  Skin:    General: Skin is warm and dry.  Neurological:     General: No focal deficit present.     Mental Status: She is alert and oriented to person, place, and time.     ED Results / Procedures / Treatments   Labs (all labs ordered are listed, but only abnormal  results are displayed) Labs Reviewed  I-STAT CHEM 8, ED    EKG EKG Interpretation Date/Time:  Sunday April 13 2023 21:28:13 EDT Ventricular Rate:  73 PR Interval:  158 QRS Duration:  97 QT Interval:  402 QTC Calculation: 443 R Axis:   -13  Text Interpretation: Sinus rhythm Abnormal R-wave progression, early transition Left ventricular hypertrophy Borderline T abnormalities, anterior leads Confirmed by Kristine Royal [phone]) on 04/13/2023 9:43:03 PM  Radiology No results found.  Procedures Procedures    Medications Ordered in ED Medications  nitroGLYCERIN (NITROSTAT) SL tablet 0.4 mg (0.4 mg Sublingual Given 04/13/23 2204)    ED Course/ Medical Decision Making/ A&P                                 Medical Decision Making Risk Prescription drug management. Decision regarding hospitalization.    Medical Screen Complete  This patient presented to the ED with complaint of esophageal food impaction.  This complaint involves an extensive number of treatment options. The initial differential diagnosis includes, but is not limited to, esophageal food impaction  This presentation is: Acute, Chronic, Self-Limited, Previously Undiagnosed, Uncertain Prognosis, and Complicated  Patient with known history of achalasia and prior history of esophageal food impactions.  Patient reports eating steak around 745.  She felt like that this food did not pass through her esophagus.  She contacted [facility] prior to coming to the ED.  No improvement with sublingual nitroglycerin administration.  Dr. Ewing Schlein with [facility] is aware of case.  Plan for EGD ASAP.   Additional history obtained:  External records from outside sources obtained and reviewed including prior ED visits and prior Inpatient records.   Problem List / ED Course:  Suspected esophageal food impaction   Reevaluation:  After the interventions noted above, I reevaluated the patient and found that they have:  stayed the same Disposition:  After consideration of the diagnostic results and the patients response to treatment, I feel that the patent would benefit from admission.          Final Clinical Impression(s) / ED Diagnoses Final diagnoses:  Esophageal obstruction due to food impaction    Rx / DC Orders ED Discharge Orders     None         Wynetta Fines, MD 04/13/23 2224

## 2023-04-13 NOTE — Anesthesia Procedure Notes (Signed)
Procedure Name: Intubation Date/Time: 04/13/2023 11:30 PM  Performed by: Nelle Don, CRNAPre-anesthesia Checklist: Patient identified, Emergency Drugs available, Suction available and Patient being monitored Patient Re-evaluated:Patient Re-evaluated prior to induction Oxygen Delivery Method: Circle system utilized Preoxygenation: Pre-oxygenation with 100% oxygen Induction Type: Rapid sequence, IV induction and Cricoid Pressure applied Laryngoscope Size: Mac and 4 Grade View: Grade II Tube type: Oral Tube size: 7.0 mm Number of attempts: 1 Airway Equipment and Method: Stylet Placement Confirmation: ETT inserted through vocal cords under direct vision, positive ETCO2 and breath sounds checked- equal and bilateral Secured at: 22 cm Tube secured with: Tape Dental Injury: Teeth and Oropharynx as per pre-operative assessment

## 2023-04-13 NOTE — Discharge Instructions (Signed)
YOU HAD AN ENDOSCOPIC PROCEDURE TODAY: Refer to the procedure report and other information in the discharge instructions given to you for any specific questions about what was found during the examination. If this information does not answer your questions, please call [facility] office at [phone] to clarify.   YOU SHOULD EXPECT: Some feelings of bloating in the abdomen. Passage of more gas than usual. Walking can help get rid of the air that was put into your GI tract during the procedure and reduce the bloating. If you had a lower endoscopy (such as a colonoscopy or flexible sigmoidoscopy) you may notice spotting of blood in your stool or on the toilet paper. Some abdominal soreness may be present for a day or two, also.  DIET: YClear liquids for 12 hours and then soft solids call if question or problems and be careful with foods like meat chicken bread apples and keep appointment with Kendell Bane for their opinion on how to proceed. Drink plenty of fluids but you should avoid alcoholic beverages for 24 hours.  ACTIVITY: Your care partner should take you home directly after the procedure. You should plan to take it easy, moving slowly for the rest of the day. You can resume normal activity the day after the procedure however YOU SHOULD NOT DRIVE, use power tools, machinery or perform tasks that involve climbing or major physical exertion for 24 hours (because of the sedation medicines used during the test).   SYMPTOMS TO REPORT IMMEDIATELY: A gastroenterologist can be reached at any hour. Please call [phone]  for any of the following symptoms:  Following lower endoscopy (colonoscopy, flexible sigmoidoscopy) Excessive amounts of blood in the stool  Significant tenderness, worsening of abdominal pains  Swelling of the abdomen that is new, acute  Fever of 100 or higher  Following upper endoscopy (EGD, EUS, ERCP, esophageal dilation) Vomiting of blood or coffee ground material  New,  significant abdominal pain  New, significant chest pain or pain under the shoulder blades  Painful or persistently difficult swallowing  New shortness of breath  Black, tarry-looking or red, bloody stools  FOLLOW UP:  If any biopsies were taken you will be contacted by phone or by letter within the next 1-3 weeks. Call [phone]  if you have not heard about the biopsies in 3 weeks.  Please also call with any specific questions about appointments or follow up tests.

## 2023-04-13 NOTE — ED Triage Notes (Addendum)
Pt reports she feels like she has a piece of meat stuck in her esophagus, she ate around 1945. Epigastric discomfort. Pt says she has tried to drink water but it does not stay down. Called on call doctor who told her to take a nitroglycerin, pt does not have any. Pt also says she had some esophageal spasms and abdominal swelling last night, hx of achalasia.

## 2023-04-13 NOTE — Anesthesia Preprocedure Evaluation (Signed)
Anesthesia Evaluation  Patient identified by MRN, date of birth, ID band Patient awake    Reviewed: Allergy & Precautions, NPO status , Patient's Chart, lab work & pertinent test results  History of Anesthesia Complications (+) PONV and history of anesthetic complications  Airway Mallampati: III  TM Distance: >3 FB Neck ROM: Full  Mouth opening: Limited Mouth Opening  Dental  (+) Teeth Intact, Dental Advisory Given   Pulmonary asthma , sleep apnea    breath sounds clear to auscultation       Cardiovascular hypertension, Pt. on medications (-) angina (-) Past MI and (-) CHF  Rhythm:Regular     Neuro/Psych neg Seizures PSYCHIATRIC DISORDERS  Depression       GI/Hepatic Neg liver ROS,,,Achalasia   Food impaction   Endo/Other  negative endocrine ROS    Renal/GU negative Renal ROS     Musculoskeletal  (+) Arthritis ,    Abdominal   Peds  Hematology negative hematology ROS (+) Lab Results      Component                Value               Date                      WBC                      5.6                 10/31/2022                HGB                      13.9                04/13/2023                HCT                      41.0                04/13/2023                MCV                      91.9                10/31/2022                PLT                      243.0               10/31/2022              Anesthesia Other Findings   Reproductive/Obstetrics                             Anesthesia Physical Anesthesia Plan  ASA: 2  Anesthesia Plan: General   Post-op Pain Management:    Induction: Intravenous, Rapid sequence and Cricoid pressure planned  PONV Risk Score and Plan: 4 or greater and Ondansetron and Dexamethasone  Airway Management Planned: Oral ETT  Additional Equipment: None  Intra-op Plan:   Post-operative Plan: Extubation in OR  Informed Consent: I have  reviewed the patients History  and Physical, chart, labs and discussed the procedure including the risks, benefits and alternatives for the proposed anesthesia with the patient or authorized representative who has indicated his/her understanding and acceptance.     Dental advisory given  Plan Discussed with: CRNA  Anesthesia Plan Comments:        Anesthesia Quick Evaluation

## 2023-04-13 NOTE — Consult Note (Signed)
Reason for Consult: Food impaction Referring Physician: ER physician  Beth Peters is an 72 y.o. female.  HPI: Patient well-known to our team with history of achalasia status post surgery but also with multiple food impactions and supposedly I had done one 5 years ago he was had some increased swallowing problems lately and has been referred back to [facility] for further workup and plans unfortunately eating steak tonight she got it caught in all of her usual methods of getting it back and been unsuccessful and she has a problem with glucagon but the ER physician tried nitroglycerin without success she has not had problems with her previous endoscopy and has no other complaints and is not on any aspirin or blood thinners  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   OA (osteoarthritis)    Osteopenia    PONV (postoperative nausea and vomiting)    Raynauds syndrome    Seasonal affective disorder (HCC)    SUI (stress urinary incontinence, female)    Superficial thrombophlebitis    x2  left lower leg superficial in  2006  prior to vein stripping     Past Surgical History:  Procedure Laterality Date   CYSTOSCOPY N/A 07/14/2018   Procedure: CYSTOSCOPY;  Surgeon: Romualdo Bolk, MD;  Location: [facility] ;  Service: Gynecology;  Laterality: N/A;   D & C HYSTEROSCOPY W/ RESECTION FIBROID  09-21-2002  dr Maggie Schwalbe  [facility]   D & C HYSTEROSCOPY W/ RESECTION POLYP  12-24-2010  dr Tresa Res  [facility]   DILATION AND CURETTAGE OF UTERUS     ESOPHAGEAL DILATION  2013  --- [facility]   Omega dilation for Achalasia   ESOPHAGOGASTRODUODENOSCOPY N/A 09/20/2017   Procedure: ESOPHAGOGASTRODUODENOSCOPY (EGD);  Surgeon: Vida Rigger, MD;  Location: [facility];  Service: Endoscopy;  Laterality: N/A;   HELLER MYOTOMY  2006   fundoplication repair hiatal hernia   KNEE ARTHROSCOPY Left 2006 &  09-27-2003   KNEE ARTHROSCOPY W/ SYNOVECTOMY Right 09-20-2009  dr Lequita Halt  [facility]   KNEE SURGERY Right 1962   RECTOCELE REPAIR N/A 07/14/2018   Procedure: POSTERIOR REPAIR (RECTOCELE);  Surgeon: Romualdo Bolk, MD;  [facility];  Service: Gynecology;  Laterality: N/A;   ROTATOR CUFF REPAIR Right 09/2011   skin cancker  01/12/2022   face   TONSILLECTOMY AND ADENOIDECTOMY  1961   TOTAL KNEE ARTHROPLASTY Bilateral left 02-05-2006 ;  right 08-14-2007   dr Lequita Halt  [facility]   TOTAL KNEE REVISION Right 11/26/2017   Procedure: Right knee polyethylene revision;  Surgeon: Ollen Gross, MD;  Location: [facility];  Service: Orthopedics;  Laterality: Right;  Adductor Block   VARICOSE VEIN SURGERY  09/2005    Family History  Problem Relation Age of Onset   Hypertension Father    Heart disease Father    Stroke Father    Cancer Father        Testicular, Bladder, Prostate, Lung   Heart disease Mother    Stroke Mother    Osteoporosis Mother    Uterine cancer Sister 57   Breast cancer Sister    Cancer Sister  rectal, liver,lymph nodes   Diabetes Maternal Grandfather    Heart disease Maternal Grandfather    Diabetes Paternal Grandmother    Breast cancer Maternal Aunt     Social History:  reports that she has never smoked. She has never used smokeless tobacco. She reports current alcohol use of about 8.0 standard drinks of alcohol per week. She reports that she does not use drugs.  Allergies:  Allergies  Allergen Reactions   Dilaudid [Hydromorphone Hcl] Hives, Shortness Of Breath and Itching   Glucagon Anaphylaxis   Lactose Intolerance (Gi)    Wasp Venom Itching and Swelling   Wasp Venom Protein Itching and Hives    Medications: I have reviewed the patient's current medications.  Results for orders placed or  performed during the hospital encounter of 04/13/23 (from the past 48 hour(s))  I-stat chem 8, ED     Status: Abnormal   Collection Time: 04/13/23 10:43 PM  Result Value Ref Range   Sodium 137 135 - 145 mmol/L   Potassium 3.4 (L) 3.5 - 5.1 mmol/L   Chloride 98 98 - 111 mmol/L   BUN 9 8 - 23 mg/dL   Creatinine, Ser 1.61 0.44 - 1.00 mg/dL   Glucose, Bld 96 70 - 99 mg/dL    Comment: Glucose reference range applies only to samples taken after fasting for at least 8 hours.   Calcium, Ion 1.14 (L) 1.15 - 1.40 mmol/L   TCO2 27 22 - 32 mmol/L   Hemoglobin 13.9 12.0 - 15.0 g/dL   HCT 09.6 04.5 - 40.9 %    No results found.  Review of Systems negative except above Blood pressure (!) 182/94, pulse 81, temperature 97.6 F (36.4 C), temperature source Temporal, resp. rate 13, height 5\' 3"  (1.6 m), weight 65.8 kg, last menstrual period 09/23/2012, SpO2 97%. Physical Exam vital signs stable afebrile no acute distress exam please see preassessment evaluation  Assessment/Plan: Food impaction in patient with history of achalasia Plan: Will proceed with endoscopy with anesthesia assistance and she agrees to that plan with further workup and plans pending those findings  Susana Duell E 04/13/2023, 11:27 PM

## 2023-04-13 NOTE — Transfer of Care (Signed)
Immediate Anesthesia Transfer of Care Note  Patient: Beth Peters  Procedure(s) Performed: ESOPHAGOGASTRODUODENOSCOPY (EGD) WITH PROPOFOL FOREIGN BODY REMOVAL  Patient Location: PACU  Anesthesia Type:General  Level of Consciousness: awake, alert , and oriented  Airway & Oxygen Therapy: Patient Spontanous Breathing and Patient connected to face mask oxygen  Post-op Assessment: Report given to RN, Post -op Vital signs reviewed and stable, and Patient moving all extremities X 4  Post vital signs: Reviewed and stable  Last Vitals:  Vitals Value Taken Time  BP 183/86   Temp    Pulse 74   Resp 12   SpO2 99     Last Pain:  Vitals:   04/13/23 2313  TempSrc: Temporal  PainSc: 0-No pain         Complications: No notable events documented.

## 2023-04-14 NOTE — Op Note (Signed)
[facility] Patient Name: Beth Peters Procedure Date: 04/13/2023 MRN: [id number] Attending MD: Vida Rigger , MD, [id number] Date of Birth: 05-May-1951 CSN: [id number] Age: 72 Admit Type: Outpatient Procedure:                Upper GI endoscopy Indications:              Foreign body in the esophagus Providers:                Vida Rigger, MD, Zoe Lan, RN, Salley Scarlet,                            Technician Referring MD:              Medicines:                General Anesthesia Complications:            No immediate complications. Estimated Blood Loss:     Estimated blood loss: none. Procedure:                Pre-Anesthesia Assessment:                           - Prior to the procedure, a History and Physical                            was performed, and patient medications and                            allergies were reviewed. The patient's tolerance of                            previous anesthesia was also reviewed. The risks                            and benefits of the procedure and the sedation                            options and risks were discussed with the patient.                            All questions were answered, and informed consent                            was obtained. Prior Anticoagulants: The patient has                            taken no anticoagulant or antiplatelet agents. ASA                            Grade Assessment: II - A patient with mild systemic                            disease. After reviewing the risks and benefits,  the patient was deemed in satisfactory condition to                            undergo the procedure.                           After obtaining informed consent, the endoscope was                            passed under direct vision. Throughout the                            procedure, the patient's blood pressure, pulse, and                            oxygen saturations were  monitored continuously. The                            GIF-H190 ([id number]) Olympus endoscope was introduced                            through the mouth, and advanced to the duodenal                            bulb. The upper GI endoscopy was accomplished                            without difficulty. The patient tolerated the                            procedure well. Scope In: Scope Out: Findings:      Food was found in the lower third of the esophagus. Removal was       accomplished with a Roth net. Minimal residual debris was pushed and       washed into the stomach      The entire examined stomach was normal. Most of the fluid was suctioned       at the end of the procedure      The duodenal bulb was normal.      The exam was otherwise without abnormality. There was no motility seen       in the esophagus but the distal esophagus did not have a ring or       stricture or look like achalasia and the midesophagus was slightly       dilated Impression:               - Food in the lower third of the esophagus. Removal                            was successful.                           - Normal stomach.                           - Normal duodenal bulb.                           -  The examination was otherwise normal. Moderate Sedation:      Not Applicable - Patient had care per Anesthesia. Recommendation:           - Clear liquid diet for 12 hours.                           - Continue present medications.                           - Return to GI clinic PRN.                           - Telephone GI clinic if symptomatic PRN. Procedure Code(s):        --- Professional ---                           [phone], Esophagogastroduodenoscopy, flexible,                            transoral; with removal of foreign body(s) Diagnosis Code(s):        --- Professional ---                           O84.166A, Food in esophagus causing other injury,                            initial encounter                            T18.108A, Unspecified foreign body in esophagus                            causing other injury, initial encounter CPT copyright 2022 American Medical Association. All rights reserved. The codes documented in this report are preliminary and upon coder review may  be revised to meet current compliance requirements. Vida Rigger, MD 04/14/2023 12:12:01 AM This report has been signed electronically. Number of Addenda: 0

## 2023-04-15 ENCOUNTER — Encounter (HOSPITAL_COMMUNITY): Payer: Self-pay | Admitting: Gastroenterology

## 2023-04-15 DIAGNOSIS — Z85828 Personal history of other malignant neoplasm of skin: Secondary | ICD-10-CM | POA: Diagnosis not present

## 2023-04-15 DIAGNOSIS — L821 Other seborrheic keratosis: Secondary | ICD-10-CM | POA: Diagnosis not present

## 2023-04-15 DIAGNOSIS — L649 Androgenic alopecia, unspecified: Secondary | ICD-10-CM | POA: Diagnosis not present

## 2023-04-15 DIAGNOSIS — L650 Telogen effluvium: Secondary | ICD-10-CM | POA: Diagnosis not present

## 2023-04-17 ENCOUNTER — Ambulatory Visit: Payer: PPO | Admitting: Obstetrics and Gynecology

## 2023-04-21 NOTE — Anesthesia Postprocedure Evaluation (Signed)
Anesthesia Post Note  Patient: Beth Peters  Procedure(s) Performed: ESOPHAGOGASTRODUODENOSCOPY (EGD) WITH PROPOFOL FOREIGN BODY REMOVAL     Patient location during evaluation: Endoscopy Anesthesia Type: General Level of consciousness: awake and alert Pain management: pain level controlled Vital Signs Assessment: post-procedure vital signs reviewed and stable Respiratory status: spontaneous breathing, nonlabored ventilation and respiratory function stable Cardiovascular status: blood pressure returned to baseline and stable Postop Assessment: no apparent nausea or vomiting Anesthetic complications: no   No notable events documented.  Last Vitals:  Vitals:   04/14/23 0000 04/14/23 0010  BP: (!) 169/83 (!) 157/82  Pulse: 73 65  Resp: 17 17  Temp:    SpO2: 99% 94%    Last Pain:  Vitals:   04/14/23 0010  TempSrc:   PainSc: 0-No pain                 Reve Crocket

## 2023-04-25 ENCOUNTER — Other Ambulatory Visit: Payer: Self-pay | Admitting: Physician Assistant

## 2023-05-21 ENCOUNTER — Telehealth: Payer: Self-pay

## 2023-05-21 NOTE — Telephone Encounter (Signed)
Transition Care Management Follow-up Telephone Call Date of discharge and from where: [facility] 9/30 How have you been since you were released from the hospital? Patient is doing well and has no needs for a follow up visit Any questions or concerns? No  Items Reviewed: Did the pt receive and understand the discharge instructions provided? Yes  Medications obtained and verified? Yes  Other? No  Any new allergies since your discharge? No  Dietary orders reviewed? No Do you have support at home? Yes     Follow up appointments reviewed:  PCP Hospital f/u appt confirmed? No  Scheduled to see  on  @ . Specialist Hospital f/u appt confirmed? No  Scheduled to see  on  @ . Are transportation arrangements needed? No  If their condition worsens, is the pt aware to call PCP or go to the Emergency Dept.? Yes Was the patient provided with contact information for the PCP's office or ED? Yes Was to pt encouraged to call back with questions or concerns? Yes

## 2023-07-04 ENCOUNTER — Other Ambulatory Visit: Payer: Self-pay | Admitting: Physician Assistant

## 2023-07-14 ENCOUNTER — Other Ambulatory Visit: Payer: Self-pay | Admitting: Physician Assistant

## 2023-07-24 ENCOUNTER — Other Ambulatory Visit: Payer: Self-pay | Admitting: Physician Assistant

## 2023-07-24 ENCOUNTER — Other Ambulatory Visit: Payer: Self-pay

## 2023-07-24 DIAGNOSIS — R4586 Emotional lability: Secondary | ICD-10-CM

## 2023-07-24 NOTE — Telephone Encounter (Signed)
 Pt LVM in triage line requesting refill on venlafaxine.

## 2023-07-25 MED ORDER — VENLAFAXINE HCL ER 37.5 MG PO CP24
ORAL_CAPSULE | ORAL | 0 refills | Status: DC
Start: 2023-07-25 — End: 2023-08-18

## 2023-07-25 NOTE — Telephone Encounter (Signed)
 Med refill request: Venlafaxine XR 37.5mg  Last AEX: 04/15/22 JJ Next AEX: none scheduled (Medicare) Last MMG (if hormonal med) n/a Refill sent to provider for approval or denial as appropriate.

## 2023-08-01 DIAGNOSIS — K220 Achalasia of cardia: Secondary | ICD-10-CM | POA: Diagnosis not present

## 2023-08-16 ENCOUNTER — Other Ambulatory Visit: Payer: Self-pay | Admitting: Radiology

## 2023-08-16 DIAGNOSIS — R4586 Emotional lability: Secondary | ICD-10-CM

## 2023-08-18 NOTE — Telephone Encounter (Signed)
Med refill request: Effexor (90 day request) Last AEX: 02/13/2021-JJ, seen for OV on 04/15/2022 Next AEX: nothing scheduled, recall was sent per EMR. Last MMG (if hormonal med):n/a Refill authorized: rx pend, msg sent to appt desk to contact and schedule.

## 2023-08-26 NOTE — Telephone Encounter (Signed)
FYI. Pt now scheduled for appt w/ TW on 10/23/2023.

## 2023-09-18 ENCOUNTER — Other Ambulatory Visit: Payer: Self-pay | Admitting: Obstetrics and Gynecology

## 2023-09-18 DIAGNOSIS — R4586 Emotional lability: Secondary | ICD-10-CM

## 2023-09-18 NOTE — Telephone Encounter (Signed)
 Med refill request: Effexor Last AEX: 04/15/22 Next AEX: 10/23/23 Last MMG (if hormonal med) 05/26/20 Refill authorized: Please Advise, #[phone], 0 RF

## 2023-10-23 ENCOUNTER — Ambulatory Visit: Payer: PPO | Admitting: Nurse Practitioner

## 2023-10-23 ENCOUNTER — Encounter: Payer: Self-pay | Admitting: Nurse Practitioner

## 2023-10-23 VITALS — BP 138/80 | HR 87 | Ht 62.0 in | Wt 138.0 lb

## 2023-10-23 DIAGNOSIS — M8580 Other specified disorders of bone density and structure, unspecified site: Secondary | ICD-10-CM | POA: Diagnosis not present

## 2023-10-23 DIAGNOSIS — N951 Menopausal and female climacteric states: Secondary | ICD-10-CM

## 2023-10-23 DIAGNOSIS — Z9189 Other specified personal risk factors, not elsewhere classified: Secondary | ICD-10-CM | POA: Diagnosis not present

## 2023-10-23 DIAGNOSIS — Z01419 Encounter for gynecological examination (general) (routine) without abnormal findings: Secondary | ICD-10-CM

## 2023-10-23 DIAGNOSIS — Z780 Asymptomatic menopausal state: Secondary | ICD-10-CM

## 2023-10-23 MED ORDER — VENLAFAXINE HCL ER 37.5 MG PO CP24: 37.5000 mg | ORAL_CAPSULE | Freq: Every day | ORAL | 3 refills | Status: AC

## 2023-10-23 NOTE — Progress Notes (Signed)
   Beth Peters [birth year] [id number]   History:  73 y.o. G2P2002 presents for breast and pelvic exam. Complains of hair loss, fatigue, and slow nail growth. Sees dermatology for hair loss, has been ongoing for 2 years. H/O Vit D and B12 deficiency. Takes Vit D supplement. Plans to see PCP to recheck labs. Postmenopausal - no HRT. S/P 2019 TLH/BSO/rectocele repair. Effexor daily for vasomotor symptoms.   Gynecologic History Patient's last menstrual period was 09/23/2012.   Contraception: post menopausal status Sexually active: No  Health Maintenance Last Pap: 11/21/2017. Results were: Normal neg HPV Last mammogram: 05/26/2020. Results were: Normal. Breast MRI 04/12/2021 Last colonoscopy: 01/10/2023, 2-year recall Last Dexa: 08/31/2021. Results were: T-score -2.1, FRAX 12.2% / 2.4%  Past medical history, past surgical history, family history and social history were all reviewed and documented in the EPIC chart. Married. Son lives in [place], has 2 kids. Daughter lives local, has 2 yo daughter. Sister with history of colorectal, uterine and breast cancer in her 75s.   ROS:  A ROS was performed and pertinent positives and negatives are included.  Exam:  Vitals:   10/23/23 1433  BP: 138/80  Pulse: 87  SpO2: 99%  Weight: 138 lb (62.6 kg)  Height: 5\' 2"  (1.575 m)   Body mass index is 25.24 kg/m. Physical Exam Constitutional:      Appearance: Normal appearance.  Neck:     Thyroid: No thyroid mass, thyromegaly or thyroid tenderness.  Cardiovascular:     Rate and Rhythm: Normal rate and regular rhythm.  Pulmonary:     Effort: Pulmonary effort is normal.     Breath sounds: Normal breath sounds.  Chest:  Breasts:    Right: Normal.     Left: Normal.  Abdominal:     Palpations: Abdomen is soft.     Tenderness: There is no abdominal tenderness.  Genitourinary:    General: Normal vulva.     Vagina: Normal.     Uterus: Absent.   Lymphadenopathy:     Upper Body:     Right  upper body: No supraclavicular or axillary adenopathy.     Left upper body: No supraclavicular or axillary adenopathy.     Patient informed chaperone available to be present for breast and pelvic exam. Patient has requested no chaperone to be present. Patient has been advised what will be completed during breast and pelvic exam.   Assessment/Plan:  72 y.o. G2P2002 for breast and pelvic exam.   Encounter for breast and pelvic examination - Education provided on SBEs, importance of preventative screenings, current guidelines, high calcium diet, regular exercise, and multivitamin daily. Labs with PCP.   Postmenopausal - no HRT. S/P 2019 TLH/BSO/rectocele repair.   Increased risk of breast cancer - Annual mammogram and MRI recommended. Overdue and plans to schedule. Normal breast exam today.   Osteopenia, unspecified location - Plan: DG Bone Density. 08/2021 T-score -2.1 without elevated FRAX. Due for DXA.  Menopausal vasomotor syndrome - Plan: venlafaxine XR (EFFEXOR-XR) 37.5 MG 24 hr capsule daily. Good management. Still has some mild hot flashes.   Screening for cervical cancer - Normal Pap history.  No longer screening per guidelines.   Screening for colon cancer - 12/2022 colonoscopy. Will repeat at GI's recommended interval.   Return in about 1 year (around 10/22/2024) for Med follow up.    Beth Mackie DNP, 3:47 PM 10/23/2023

## 2023-11-07 ENCOUNTER — Other Ambulatory Visit: Payer: Self-pay | Admitting: Physician Assistant

## 2023-11-10 ENCOUNTER — Other Ambulatory Visit: Payer: Self-pay | Admitting: Physician Assistant

## 2023-11-10 ENCOUNTER — Telehealth (INDEPENDENT_AMBULATORY_CARE_PROVIDER_SITE_OTHER): Admitting: Physician Assistant

## 2023-11-10 ENCOUNTER — Encounter: Payer: Self-pay | Admitting: Physician Assistant

## 2023-11-10 DIAGNOSIS — J0110 Acute frontal sinusitis, unspecified: Secondary | ICD-10-CM | POA: Diagnosis not present

## 2023-11-10 MED ORDER — GUAIFENESIN-CODEINE 100-10 MG/5ML PO SOLN
5.0000 mL | Freq: Three times a day (TID) | ORAL | 0 refills | Status: DC | PRN
Start: 2023-11-10 — End: 2024-05-04

## 2023-11-10 MED ORDER — AZITHROMYCIN 250 MG PO TABS: ORAL_TABLET | ORAL | 0 refills | Status: AC

## 2023-11-10 NOTE — Progress Notes (Signed)
 Virtual Visit via Video Note   I, [name], PA, connected with  Beth Peters  ([id number], 1951/01/27) on 11/10/23 at  2:40 PM EDT by a video-enabled telemedicine application and verified that I am speaking with the correct person using two identifiers.  Location: Patient: Home Provider: [facility] [place] office   I discussed the limitations of evaluation and management by telemedicine and the availability of in person appointments. The patient expressed understanding and agreed to proceed.    History of Present Illness: Beth Peters is a 73 y.o. who identifies as a female who was assigned female at birth, and is being seen today for sinus problem. Was watching her daughter Sore throat, cough.  She has been watching her grandkids and they have been sick with various illnesses.  She has had symptoms for at least 10 days.  She has nasal congestion and productive cough.  She denies shortness of breath.  She is using Wixela daily.  She denies fever, chills.  She is having fatigue.  Problems:  Patient Active Problem List   Diagnosis Date Noted   Abnormal dreams 12/25/2022   Loud snoring 12/25/2022   Chronic fatigue 12/25/2022   OSA (obstructive sleep apnea) 12/25/2022   Snoring 08/17/2019   Gastroesophageal reflux disease with esophagitis without hemorrhage 08/17/2019   Bilateral shoulder pain 08/11/2019   Cervical radiculopathy 07/21/2019   Chronic constipation    PONV (postoperative nausea and vomiting)    Cubital tunnel syndrome, bilateral    Lactose intolerance    Seasonal affective disorder (HCC)    SUI (stress urinary incontinence, female)    Anemia    Raynauds syndrome    Superficial thrombophlebitis    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   Laryngopharyngeal reflux (LPR) 04/27/2019   Deviated septum 12/05/2018   Sacroiliac joint pain 10/29/2018   Status post laparoscopic hysterectomy 07/14/2018   Increased risk of breast cancer, 23.4% 05/10/2018    Menopausal vasomotor syndrome 05/10/2018   Genetic testing 03/19/2018   Family history of breast cancer    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   Osteopenia    Achalasia 10/28/2017   Thoracic spondylosis 10/14/2017   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   Scoliosis 10/16/2012   Herniation of rectum into vagina 10/14/2012   OA (osteoarthritis) 10/07/2012   Eczema 08/08/2011   Hyperlipidemia 07/24/2011   Allergic rhinitis 05/24/2009   Lesion of ulnar nerve 05/24/2009   Essential hypertension 02/27/2009   Asthma 02/27/2009   Lordosis of lumbar region 02/2000    Allergies:  Allergies  Allergen Reactions   Dilaudid [Hydromorphone Hcl] Hives, Shortness Of Breath and Itching   Glucagon Anaphylaxis   Lactose Intolerance (Gi)    Wasp Venom Itching and Swelling   Wasp Venom Protein Itching and Hives   Medications:  Current Outpatient Medications:    azithromycin  (ZITHROMAX ) 250 MG tablet, Take 2 tablets on day 1, then 1 tablet daily on days 2 through 5, Disp: 6 tablet, Rfl: 0   guaiFENesin -codeine  100-10 MG/5ML syrup, Take 5 mLs by mouth 3 (three) times daily as needed for cough., Disp: 120 mL, Rfl: 0   buPROPion  (WELLBUTRIN  XL) 150 MG 24 hr tablet, TAKE 1 TABLET BY MOUTH EVERY DAY, Disp: 90 tablet, Rfl: 0   CALCIUM PO, Take 15 mLs by mouth daily., Disp: , Rfl:    Cholecalciferol (VITAMIN D3) 1000 units CAPS, Take 1,000 Units by mouth daily., Disp: , Rfl:    diclofenac Sodium (VOLTAREN) 1 % GEL,  Voltaren 1 % topical gel, Disp: , Rfl:    docusate sodium  (COLACE) 100 MG capsule, Take 100 mg by mouth 2 (two) times daily as needed for mild constipation., Disp: , Rfl:    EPINEPHrine  0.3 mg/0.3 mL IJ SOAJ injection, Inject 0.3 mg into the muscle as needed for anaphylaxis., Disp: 1 each, Rfl: 1   esomeprazole (NEXIUM) 40 MG capsule, Take 40 mg by mouth See admin instructions. Take 40 mg by mouth daily. May take additional 40 mg if needed for acid reflux, Disp: , Rfl:     hydrochlorothiazide  (HYDRODIURIL ) 25 MG tablet, Take 1 tablet (25 mg total) by mouth daily., Disp: 90 tablet, Rfl: 3   venlafaxine  XR (EFFEXOR -XR) 37.5 MG 24 hr capsule, Take 1 capsule (37.5 mg total) by mouth daily., Disp: 90 capsule, Rfl: 3   WIXELA INHUB 250-50 MCG/ACT AEPB, INHALE 1 PUFF INTO THE LUNGS IN THE MORNING AND AT BEDTIME., Disp: 60 each, Rfl: 2  Observations/Objective: Patient is well-developed, well-nourished in no acute distress.  Resting comfortably  at home.  Head is normocephalic, atraumatic.  No labored breathing.  Speech is clear and coherent with logical content.  Patient is alert and oriented at baseline.   Assessment and Plan: 1. Acute non-recurrent frontal sinusitis (Primary) No red flags on exam.   Will initiate azithromycin  and guaifenesin  codeine  syrup per orders --sleepy precautions advised Discussed taking medications as prescribed.  Reviewed return precautions including new or worsening fever, SOB, new or worsening cough or other concerns.  Push fluids and rest.  I recommend that patient follow-up if symptoms worsen or persist despite treatment x 7-10 days, sooner if needed.   Follow Up Instructions: I discussed the assessment and treatment plan with the patient. The patient was provided an opportunity to ask questions and all were answered. The patient agreed with the plan and demonstrated an understanding of the instructions.  A copy of instructions were sent to the patient via MyChart unless otherwise noted below.   The patient was advised to call back or seek an in-person evaluation if the symptoms worsen or if the condition fails to improve as anticipated.  [name], [place]

## 2023-11-20 ENCOUNTER — Ambulatory Visit: Payer: PPO

## 2023-11-20 VITALS — Ht 62.0 in | Wt 150.0 lb

## 2023-11-20 DIAGNOSIS — Z1231 Encounter for screening mammogram for malignant neoplasm of breast: Secondary | ICD-10-CM | POA: Diagnosis not present

## 2023-11-20 DIAGNOSIS — Z0000 Encounter for general adult medical examination without abnormal findings: Secondary | ICD-10-CM

## 2023-11-20 NOTE — Progress Notes (Signed)
 Subjective:   Beth Peters is a 73 y.o. who presents for a Medicare Wellness preventive visit.  Visit Complete: Virtual I connected with  Beth Peters on 11/20/23 by a audio enabled telemedicine application and verified that I am speaking with the correct person using two identifiers.  Patient Location: Home  Provider Location: Office/Clinic  I discussed the limitations of evaluation and management by telemedicine. The patient expressed understanding and agreed to proceed.  Vital Signs: Because this visit was a virtual/telehealth visit, some criteria may be missing or patient reported. Any vitals not documented were not able to be obtained and vitals that have been documented are patient reported.  VideoDeclined- This patient declined Librarian, academic. Therefore the visit was completed with audio only.  Persons Participating in Visit: Patient.  AWV Questionnaire: No: Patient Medicare AWV questionnaire was not completed prior to this visit.  Cardiac Risk Factors include: advanced age (>40men, >47 women);dyslipidemia;hypertension     Objective:    Today's Vitals   11/20/23 1004 11/20/23 1007  Weight: 150 lb (68 kg)   Height: 5\' 2"  (1.575 m)   PainSc:  6    Body mass index is 27.44 kg/m.     11/20/2023   10:13 AM 04/13/2023    9:24 PM 11/14/2022   10:16 AM 12/17/2019    5:23 PM 10/28/2019    3:07 PM 07/14/2018    6:24 AM 07/09/2018    9:43 AM  Advanced Directives  Does Patient Have a Medical Advance Directive? Yes No Yes Yes Yes No No  Type of Estate agent of [place];Living will  Healthcare Power of [place];Living will Healthcare Power of [place];Living will Living will;Healthcare Power of Attorney    Does patient want to make changes to medical advance directive?     No - Patient declined    Copy of Healthcare Power of Attorney in Chart? No - copy requested  No - copy requested  No - copy requested    Would  patient like information on creating a medical advance directive?  Yes (ED - Information included in AVS)    No - Patient declined No - Patient declined    Current Medications (verified) Outpatient Encounter Medications as of 11/20/2023  Medication Sig   buPROPion  (WELLBUTRIN  XL) 150 MG 24 hr tablet TAKE 1 TABLET BY MOUTH EVERY DAY   CALCIUM PO Take 15 mLs by mouth daily.   Cholecalciferol (VITAMIN D3) 1000 units CAPS Take 1,000 Units by mouth daily.   diclofenac Sodium (VOLTAREN) 1 % GEL Voltaren 1 % topical gel   docusate sodium  (COLACE) 100 MG capsule Take 100 mg by mouth 2 (two) times daily as needed for mild constipation.   esomeprazole (NEXIUM) 40 MG capsule Take 40 mg by mouth See admin instructions. Take 40 mg by mouth daily. May take additional 40 mg if needed for acid reflux   guaiFENesin -codeine  100-10 MG/5ML syrup Take 5 mLs by mouth 3 (three) times daily as needed for cough.   hydrochlorothiazide  (HYDRODIURIL ) 25 MG tablet TAKE 1 TABLET (25 MG TOTAL) BY MOUTH DAILY.   venlafaxine  XR (EFFEXOR -XR) 37.5 MG 24 hr capsule Take 1 capsule (37.5 mg total) by mouth daily.   WIXELA INHUB 250-50 MCG/ACT AEPB INHALE 1 PUFF INTO THE LUNGS IN THE MORNING AND AT BEDTIME.   EPINEPHrine  0.3 mg/0.3 mL IJ SOAJ injection Inject 0.3 mg into the muscle as needed for anaphylaxis. (Patient not taking: Reported on 11/20/2023)   No facility-administered encounter medications  on file as of 11/20/2023.    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   OA (osteoarthritis)    Osteopenia    PONV (postoperative nausea and vomiting)     Raynauds syndrome    Seasonal affective disorder (HCC)    SUI (stress urinary incontinence, female)    Superficial thrombophlebitis    x2  left lower leg superficial in  2006  prior to vein stripping    Past Surgical History:  Procedure Laterality Date   CYSTOSCOPY N/A 07/14/2018   Procedure: CYSTOSCOPY;  Surgeon: Wanita Gutta, MD;  Location: [facility];  Service: Gynecology;  Laterality: N/A;   D & C HYSTEROSCOPY W/ RESECTION FIBROID  09-21-2002  dr Jonathan Neighbor  [facility]   D & C HYSTEROSCOPY W/ RESECTION POLYP  12-24-2010  dr Denton Flakes  [facility]   DILATION AND CURETTAGE OF UTERUS     ESOPHAGEAL DILATION  2013  --- [facility]   Omega dilation for Achalasia   ESOPHAGOGASTRODUODENOSCOPY N/A 09/20/2017   Procedure: ESOPHAGOGASTRODUODENOSCOPY (EGD);  Surgeon: Ozell Blunt, MD;  Location: [facility];  Service: Endoscopy;  Laterality: N/A;   ESOPHAGOGASTRODUODENOSCOPY (EGD) WITH PROPOFOL  N/A 04/13/2023   Procedure: ESOPHAGOGASTRODUODENOSCOPY (EGD) WITH PROPOFOL ;  Surgeon: Ozell Blunt, MD;  Location: [facility];  Service: Gastroenterology;  Laterality: N/A;   FOREIGN BODY REMOVAL  04/13/2023   Procedure: FOREIGN BODY REMOVAL;  Surgeon: Ozell Blunt, MD;  Location: [facility];  Service: Gastroenterology;;   HELLER MYOTOMY  2006   fundoplication repair hiatal hernia   KNEE ARTHROSCOPY Left 2006 &  09-27-2003   KNEE ARTHROSCOPY W/ SYNOVECTOMY Right 09-20-2009  dr France Ina  [facility]   KNEE SURGERY Right 1962   RECTOCELE REPAIR N/A 07/14/2018   Procedure: POSTERIOR REPAIR (RECTOCELE);  Surgeon: Jertson, Jill Evelyn, MD;  Location[facility];  Service: Gynecology;  Laterality: N/A;   ROTATOR CUFF REPAIR Right 09/2011   skin cancker  01/12/2022   face   TONSILLECTOMY AND ADENOIDECTOMY  1961   TOTAL KNEE ARTHROPLASTY Bilateral left 02-05-2006 ;  right 08-14-2007   dr France Ina  [facility]   TOTAL KNEE REVISION Right 11/26/2017   Procedure: Right knee polyethylene revision;  Surgeon:  Liliane Rei, MD;  Location: [facility];  Service: Orthopedics;  Laterality: Right;  Adductor Block   VARICOSE VEIN SURGERY  09/2005   Family History  Problem Relation Age of Onset   Hypertension Father    Heart disease Father    Stroke Father    Cancer Father        Testicular, Bladder, Prostate, Lung   Heart disease Mother    Stroke Mother    Osteoporosis Mother    Uterine cancer Sister 93   Breast cancer Sister    Cancer Sister        rectal, liver,lymph nodes   Diabetes Maternal Grandfather    Heart disease Maternal Grandfather    Diabetes Paternal Grandmother    Breast cancer Maternal Aunt    Social History   Socioeconomic History   Marital status: Married    Spouse name: Not  on file   Number of children: 2   Years of education: Not on file   Highest education level: Bachelor's degree (e.g., BA, AB, BS)  Occupational History   Occupation: Retired     Comment: Social research officer, government   Tobacco Use   Smoking status: Never   Smokeless tobacco: Never  Vaping Use   Vaping status: Never Used  Substance and Sexual Activity   Alcohol use: Yes    Alcohol/week: 8.0 standard drinks of alcohol    Types: 8 Glasses of wine per week   Drug use: No   Sexual activity: Yes    Partners: Male    Birth control/protection: Post-menopausal, Surgical    Comment: vasectomy, hysterectomy, less than 5 partner, IC after 16, no STD, no abnormal pap, no DES  Other Topics Concern   Not on file  Social History Narrative   Daughter getting married Spring 2021; 1st grandchild due in June       Hobbies: Oil painting and antiquing    Social Drivers of Health   Financial Resource Strain: Low Risk  (11/20/2023)   Overall Financial Resource Strain (CARDIA)    Difficulty of Paying Living Expenses: Not hard at all  Food Insecurity: No Food Insecurity (11/20/2023)   Hunger Vital Sign    Worried About Running Out of Food in the Last Year: Never true    Ran Out of Food in the Last Year: Never true   Transportation Needs: No Transportation Needs (11/20/2023)   PRAPARE - Administrator, Civil Service (Medical): No    Lack of Transportation (Non-Medical): No  Physical Activity: Sufficiently Active (11/20/2023)   Exercise Vital Sign    Days of Exercise per Week: 3 days    Minutes of Exercise per Session: 60 min  Stress: No Stress Concern Present (11/20/2023)   [employer] of Occupational Health - Occupational Stress Questionnaire    Feeling of Stress : Only a little  Social Connections: Socially Integrated (11/20/2023)   Social Connection and Isolation Panel [NHANES]    Frequency of Communication with Friends and Family: More than three times a week    Frequency of Social Gatherings with Friends and Family: More than three times a week    Attends Religious Services: More than 4 times per year    Active Member of [employer] or Organizations: Yes    Attends Banker Meetings: 1 to 4 times per year    Marital Status: Married    Tobacco Counseling Counseling given: Not Answered    Clinical Intake:  Pre-visit preparation completed: Yes  Pain : 0-10 Pain Score: 6  Pain Type: Chronic pain Pain Location: Generalized Pain Descriptors / Indicators: Aching     BMI - recorded: 27.44 Nutritional Status: BMI 25 -29 Overweight Nutritional Risks: None Diabetes: No  Lab Results  Component Value Date   HGBA1C 5.4 06/02/2017     How often do you need to have someone help you when you read instructions, pamphlets, or other written materials from your doctor or pharmacy?: 1 - Never  Interpreter Needed?: No  Information entered by :: Lamont Pilsner, LPN   Activities of Daily Living     11/20/2023   10:06 AM  In your present state of health, do you have any difficulty performing the following activities:  Hearing? 1  Comment slight HOH  Vision? 0  Difficulty concentrating or making decisions? 0  Walking or climbing stairs? 0  Dressing or bathing? 0  Doing  errands, shopping?  0  Preparing Food and eating ? N  Using the Toilet? N  In the past six months, have you accidently leaked urine? N  Do you have problems with loss of bowel control? N  Managing your Medications? N  Managing your Finances? N  Housekeeping or managing your Housekeeping? N    Patient Care Team: [name], [place] as PCP - General (Physician Assistant) Liliane Rei, MD as Consulting Physician (Orthopedic Surgery) Blondell Burgess, PA-C as Physician Assistant (Chiropractic Medicine) Lanita Pitman, MD as Consulting Physician (Gastroenterology) Adelaide Adjutant, MD as Consulting Physician (Physical Medicine and Rehabilitation) Mort Ards, MD as Consulting Physician (Orthopedic Surgery) Elna Haggis, MD as Consulting Physician (Neurosurgery) Janita Mellow, MD as Consulting Physician (Otolaryngology) Dohmeier, Raoul Byes, MD as Consulting Physician (Neurology)  Indicate any recent Medical Services you may have received from other than [facility] providers in the past year (date may be approximate).     Assessment:    This is a routine wellness examination for Rion.  Hearing/Vision screen Hearing Screening - Comments:: Pt stated slight HOH  Vision Screening - Comments:: Wears rx glasses - up to date with routine eye exams with Dr Cari Char at [facility]    Goals Addressed             This Visit's Progress    Patient Stated       Maintain health and activity        Depression Screen     11/20/2023   10:11 AM 11/14/2022   10:14 AM 10/31/2022    9:46 AM 10/28/2019    3:09 PM 06/01/2019   11:03 AM 06/02/2017    1:08 PM  PHQ 2/9 Scores  PHQ - 2 Score 0 0 1 2 2  0  PHQ- 9 Score  0 8 4 14      Fall Risk     11/20/2023   10:15 AM 11/13/2022   10:51 PM 10/31/2022    9:47 AM 10/28/2019    3:08 PM 06/01/2019   11:01 AM  Fall Risk   Falls in the past year? 1 1 1  0 1  Number falls in past yr: 1 1 1  0 1  Injury with Fall? 1 0 0 0 0  Comment hit head      Risk  for fall due to : History of fall(s) History of fall(s);Impaired balance/gait;Impaired vision Impaired balance/gait Impaired mobility Impaired balance/gait;Other (Comment)  Risk for fall due to: Comment     Dogs tripped her  Follow up Falls prevention discussed Falls prevention discussed Falls evaluation completed Falls evaluation completed;Education provided;Falls prevention discussed Falls evaluation completed    MEDICARE RISK AT HOME:  Medicare Risk at Home Any stairs in or around the home?: Yes If so, are there any without handrails?: No Home free of loose throw rugs in walkways, pet beds, electrical cords, etc?: Yes Adequate lighting in your home to reduce risk of falls?: Yes Life alert?: No Use of a cane, walker or w/c?: No Grab bars in the bathroom?: No Shower chair or bench in shower?: Yes Elevated toilet seat or a handicapped toilet?: No  TIMED UP AND GO:  Was the test performed?  No  Cognitive Function: 6CIT completed        11/20/2023   10:16 AM 11/14/2022   10:18 AM 10/28/2019    3:08 PM  6CIT Screen  What Year? 0 points 0 points 0 points  What month? 0 points 0 points 0 points  What time? 0 points 0 points 0  points  Count back from 20 0 points 0 points 0 points  Months in reverse 0 points 0 points 0 points  Repeat phrase 0 points 0 points 0 points  Total Score 0 points 0 points 0 points    Immunizations Immunization History  Administered Date(s) Administered   Fluad Quad(high Dose 65+) 05/14/2019   Influenza, High Dose Seasonal PF 07/11/2011, 05/06/2018, 05/14/2023   Influenza,inj,Quad PF,6+ Mos 05/13/2017, 06/29/2020   Influenza,inj,quad, With Preservative 04/14/2017, 04/14/2018   Influenza-Unspecified 07/02/2021   PFIZER(Purple Top)SARS-COV-2 Vaccination 08/27/2019, 09/21/2019, 06/29/2020   PNEUMOCOCCAL CONJUGATE-20 07/02/2021   Pneumococcal Conjugate-13 05/06/2018   Pneumococcal Polysaccharide-23 07/15/2005   Td 07/15/2005   Zoster Recombinant(Shingrix)  02/13/2022   Zoster, Live 09/30/2012    Screening Tests Health Maintenance  Topic Date Due   DTaP/Tdap/Td (2 - Tdap) 07/16/2015   Zoster Vaccines- Shingrix (2 of 2) 04/10/2022   COVID-19 Vaccine (4 - 2024-25 season) 03/16/2023   MAMMOGRAM  04/13/2023   INFLUENZA VACCINE  02/13/2024   Medicare Annual Wellness (AWV)  11/19/2024   Colonoscopy  01/09/2033   Pneumonia Vaccine 38+ Years old  Completed   DEXA SCAN  Completed   Hepatitis C Screening  Completed   HPV VACCINES  Aged Out   Meningococcal B Vaccine  Aged Out    Health Maintenance  Health Maintenance Due  Topic Date Due   DTaP/Tdap/Td (2 - Tdap) 07/16/2015   Zoster Vaccines- Shingrix (2 of 2) 04/10/2022   COVID-19 Vaccine (4 - 2024-25 season) 03/16/2023   MAMMOGRAM  04/13/2023   Health Maintenance Items Addressed: See Nurse Notes  Additional Screening:  Vision Screening: Recommended annual ophthalmology exams for early detection of glaucoma and other disorders of the eye.  Dental Screening: Recommended annual dental exams for proper oral hygiene  Community Resource Referral / Chronic Care Management: CRR required this visit?  No   CCM required this visit?  No     Plan:     I have personally reviewed and noted the following in the patient's chart:   Medical and social history Use of alcohol, tobacco or illicit drugs  Current medications and supplements including opioid prescriptions. Patient is not currently taking opioid prescriptions. Functional ability and status Nutritional status Physical activity Advanced directives List of other physicians Hospitalizations, surgeries, and ER visits in previous 12 months Vitals Screenings to include cognitive, depression, and falls Referrals and appointments  In addition, I have reviewed and discussed with patient certain preventive protocols, quality metrics, and best practice recommendations. A written personalized care plan for preventive services as well as  general preventive health recommendations were provided to patient.     Bruno Capri, LPN   07/20/1094   After Visit Summary: (MyChart) Due to this being a telephonic visit, the after visit summary with patients personalized plan was offered to patient via MyChart   Notes: Nothing significant to report at this time.

## 2023-11-20 NOTE — Patient Instructions (Signed)
 Beth Peters , Thank you for taking time to come for your Medicare Wellness Visit. I appreciate your ongoing commitment to your health goals. Please review the following plan we discussed and let me know if I can assist you in the future.   Referrals/Orders/Follow-Ups/Clinician Recommendations: maintain health and activity   This is a list of the screening recommended for you and due dates:  Health Maintenance  Topic Date Due   DTaP/Tdap/Td vaccine (2 - Tdap) 07/16/2015   Zoster (Shingles) Vaccine (2 of 2) 04/10/2022   COVID-19 Vaccine (4 - 2024-25 season) 03/16/2023   Mammogram  04/13/2023   Flu Shot  02/13/2024   Medicare Annual Wellness Visit  11/19/2024   Colon Cancer Screening  01/09/2033   Pneumonia Vaccine  Completed   DEXA scan (bone density measurement)  Completed   Hepatitis C Screening  Completed   HPV Vaccine  Aged Out   Meningitis B Vaccine  Aged Out    Advanced directives: (Copy Requested) Please bring a copy of your health care power of attorney and living will to the office to be added to your chart at your convenience. You can mail to [facility] [address] [place], [place] [address] or email to [email or web address]  Next Medicare Annual Wellness Visit scheduled for next year: Yes  Have you seen your provider in the last 6 months (3 months if uncontrolled diabetes)? Yes4/28/25

## 2023-11-21 ENCOUNTER — Ambulatory Visit
Admission: RE | Admit: 2023-11-21 | Discharge: 2023-11-21 | Disposition: A | Discharge status: Home / Self Care | Source: Ambulatory Visit | Attending: Physician Assistant

## 2023-11-21 DIAGNOSIS — Z1231 Encounter for screening mammogram for malignant neoplasm of breast: Secondary | ICD-10-CM

## 2024-01-07 DIAGNOSIS — M180 Bilateral primary osteoarthritis of first carpometacarpal joints: Secondary | ICD-10-CM | POA: Diagnosis not present

## 2024-01-07 DIAGNOSIS — M19041 Primary osteoarthritis, right hand: Secondary | ICD-10-CM | POA: Diagnosis not present

## 2024-01-07 DIAGNOSIS — M19042 Primary osteoarthritis, left hand: Secondary | ICD-10-CM | POA: Diagnosis not present

## 2024-01-15 ENCOUNTER — Emergency Department (HOSPITAL_COMMUNITY)

## 2024-01-15 ENCOUNTER — Other Ambulatory Visit: Payer: Self-pay

## 2024-01-15 ENCOUNTER — Encounter (HOSPITAL_COMMUNITY): Payer: Self-pay

## 2024-01-15 ENCOUNTER — Emergency Department (HOSPITAL_COMMUNITY)
Admission: EM | Admit: 2024-01-15 | Discharge: 2024-01-15 | Disposition: A | Discharge status: Home / Self Care | Attending: Emergency Medicine | Admitting: Emergency Medicine

## 2024-01-15 DIAGNOSIS — R9082 White matter disease, unspecified: Secondary | ICD-10-CM | POA: Diagnosis not present

## 2024-01-15 DIAGNOSIS — J341 Cyst and mucocele of nose and nasal sinus: Secondary | ICD-10-CM | POA: Diagnosis not present

## 2024-01-15 DIAGNOSIS — J45909 Unspecified asthma, uncomplicated: Secondary | ICD-10-CM | POA: Diagnosis not present

## 2024-01-15 DIAGNOSIS — H538 Other visual disturbances: Secondary | ICD-10-CM | POA: Diagnosis not present

## 2024-01-15 DIAGNOSIS — R42 Dizziness and giddiness: Secondary | ICD-10-CM | POA: Diagnosis not present

## 2024-01-15 DIAGNOSIS — I10 Essential (primary) hypertension: Secondary | ICD-10-CM | POA: Diagnosis not present

## 2024-01-15 DIAGNOSIS — G319 Degenerative disease of nervous system, unspecified: Secondary | ICD-10-CM | POA: Diagnosis not present

## 2024-01-15 LAB — CBC
HCT: 43.2 % (ref 36.0–46.0)
Hemoglobin: 14.7 g/dL (ref 12.0–15.0)
MCH: 31 pg (ref 26.0–34.0)
MCHC: 34 g/dL (ref 30.0–36.0)
MCV: 91.1 fL (ref 80.0–100.0)
Platelets: 197 10*3/uL (ref 150–400)
RBC: 4.74 MIL/uL (ref 3.87–5.11)
RDW: 13 % (ref 11.5–15.5)
WBC: 7.5 10*3/uL (ref 4.0–10.5)
nRBC: 0 % (ref 0.0–0.2)

## 2024-01-15 LAB — URINALYSIS, ROUTINE W REFLEX MICROSCOPIC
Bilirubin Urine: NEGATIVE
Glucose, UA: NEGATIVE mg/dL
Hgb urine dipstick: NEGATIVE
Ketones, ur: NEGATIVE mg/dL
Leukocytes,Ua: NEGATIVE
Nitrite: NEGATIVE
Protein, ur: NEGATIVE mg/dL
Specific Gravity, Urine: 1.008 (ref 1.005–1.030)
pH: 8 (ref 5.0–8.0)

## 2024-01-15 LAB — COMPREHENSIVE METABOLIC PANEL WITH GFR
ALT: 21 U/L (ref 0–44)
AST: 24 U/L (ref 15–41)
Albumin: 4.1 g/dL (ref 3.5–5.0)
Alkaline Phosphatase: 55 U/L (ref 38–126)
Anion gap: 14 (ref 5–15)
BUN: 16 mg/dL (ref 8–23)
CO2: 25 mmol/L (ref 22–32)
Calcium: 10 mg/dL (ref 8.9–10.3)
Chloride: 101 mmol/L (ref 98–111)
Creatinine, Ser: 0.76 mg/dL (ref 0.44–1.00)
GFR, Estimated: >60 mL/min (ref >60)
Glucose, Bld: 110 mg/dL — ABNORMAL HIGH (ref 70–99)
Potassium: 3.7 mmol/L (ref 3.5–5.1)
Sodium: 140 mmol/L (ref 135–145)
Total Bilirubin: 0.8 mg/dL (ref 0.0–1.2)
Total Protein: 6.9 g/dL (ref 6.5–8.1)

## 2024-01-15 LAB — CBG MONITORING, ED: Glucose-Capillary: 99 mg/dL (ref 70–99)

## 2024-01-15 NOTE — ED Provider Notes (Signed)
 [place] EMERGENCY DEPARTMENT AT [facility] Provider Note   CSN: [id number] Arrival date & time: 01/15/24  1142     Patient presents with: Blurred Vision    Beth Peters is a 73 y.o. female.  With history of hypertension and asthma who presents to the ED for blurred vision.  Patient first noticed blurred vision in both eyes about 2 days ago.  This temporarily improved but returned again today.  Earlier this morning she was very dizzy and had severe imbalance when walking.  Took her blood pressure at home was noted to be severely hypertensive with systolic blood pressure above 799d.  Symptoms have improved including blurred vision but she is still having some imbalance when walking to the bathroom here.  Denies headaches, focal weakness, chest pain, shortness of breath nausea vomiting fever chills recent illness.  No prior history of vertigo.  She has had episodes of dizziness associated with ear blockage in the past but this felt very different   HPI     Prior to Admission medications   Medication Sig Start Date End Date Taking? Authorizing Provider  buPROPion  (WELLBUTRIN  XL) 150 MG 24 hr tablet TAKE 1 TABLET BY MOUTH EVERY DAY 11/07/23   Job Lukes, PA  CALCIUM PO Take 15 mLs by mouth daily.    [provider]  Cholecalciferol (VITAMIN D3) 1000 units CAPS Take 1,000 Units by mouth daily.    [provider]  diclofenac Sodium (VOLTAREN) 1 % GEL Voltaren 1 % topical gel    [provider]  docusate sodium  (COLACE) 100 MG capsule Take 100 mg by mouth 2 (two) times daily as needed for mild constipation.    [provider]  EPINEPHrine  0.3 mg/0.3 mL IJ SOAJ injection Inject 0.3 mg into the muscle as needed for anaphylaxis. Patient not taking: Reported on 11/20/2023 10/31/22   Job Lukes, PA  esomeprazole (NEXIUM) 40 MG capsule Take 40 mg by mouth See admin instructions. Take 40 mg by mouth daily. May take additional 40 mg if needed  for acid reflux    [provider]  guaiFENesin -codeine  100-10 MG/5ML syrup Take 5 mLs by mouth 3 (three) times daily as needed for cough. 11/10/23   Job Lukes, PA  hydrochlorothiazide  (HYDRODIURIL ) 25 MG tablet TAKE 1 TABLET (25 MG TOTAL) BY MOUTH DAILY. 11/10/23   Job Lukes, PA  venlafaxine  XR (EFFEXOR -XR) 37.5 MG 24 hr capsule Take 1 capsule (37.5 mg total) by mouth daily. 10/23/23   Prentiss Annabella LABOR, NP  NAPOLEON INHUB 250-50 MCG/ACT AEPB INHALE 1 PUFF INTO THE LUNGS IN THE MORNING AND AT BEDTIME. 07/04/23   Job Lukes, PA    Allergies: Dilaudid [hydromorphone hcl], Glucagon, Lactose intolerance (gi), Wasp venom, and Wasp venom protein    Review of Systems  Updated Vital Signs BP (!) 149/76   Pulse 68   Temp 97.8 F (36.6 C) (Oral)   Resp 15   Ht 5' 1 (1.549 m)   Wt 68 kg   LMP 09/23/2012   SpO2 100%   BMI 28.34 kg/m   Physical Exam Vitals and nursing note reviewed.  HENT:     Head: Normocephalic and atraumatic.  Eyes:     Pupils: Pupils are equal, round, and reactive to light.  Cardiovascular:     Rate and Rhythm: Normal rate and regular rhythm.  Pulmonary:     Effort: Pulmonary effort is normal.     Breath sounds: Normal breath sounds.  Abdominal:  Palpations: Abdomen is soft.     Tenderness: There is no abdominal tenderness.  Skin:    General: Skin is warm and dry.  Neurological:     Mental Status: She is alert and oriented to person, place, and time.     Sensory: No sensory deficit.     Motor: No weakness.     Coordination: Coordination abnormal.     Comments: Mild dysmetria with finger-nose in both hands  Psychiatric:        Mood and Affect: Mood normal.     (all labs ordered are listed, but only abnormal results are displayed) Labs Reviewed  COMPREHENSIVE METABOLIC PANEL WITH GFR - Abnormal; Notable for the following components:      Result Value   Glucose, Bld 110 (*)    All other components within normal limits   URINALYSIS, ROUTINE W REFLEX MICROSCOPIC - Abnormal; Notable for the following components:   Color, Urine STRAW (*)    All other components within normal limits  CBC  CBG MONITORING, ED    EKG: EKG Interpretation Date/Time:  Thursday January 15 2024 11:51:31 EDT Ventricular Rate:  64 PR Interval:  155 QRS Duration:  97 QT Interval:  445 QTC Calculation: 460 R Axis:   -21  Text Interpretation: Sinus rhythm Left ventricular hypertrophy Confirmed by Pamella Sharper [phone]) on 01/15/2024 3:59:33 PM  Radiology: MR BRAIN WO CONTRAST Result Date: 01/15/2024 CLINICAL DATA:  Stroke, follow-up.  Dizziness.  Hypertension. EXAM: MRI HEAD WITHOUT CONTRAST TECHNIQUE: Multiplanar, multiecho pulse sequences of the brain and surrounding structures were obtained without intravenous contrast. COMPARISON:  Head CT 01/15/2024 FINDINGS: Brain: There is no evidence of an acute infarct, intracranial hemorrhage, mass, midline shift, or extra-axial fluid collection. Scattered small T2 hyperintensities in the cerebral white matter bilaterally are nonspecific but compatible with minimal chronic small vessel ischemic disease, not atypical for age. There is moderate, central predominant cerebral atrophy. Vascular: Major intracranial vascular flow voids are preserved. Skull and upper cervical spine: Unremarkable bone marrow signal. Sinuses/Orbits: Unremarkable orbits. Small mucous retention cyst in the right maxillary sinus. No significant mastoid fluid. Other: None. IMPRESSION: 1. No acute intracranial abnormality. 2. Moderate cerebral atrophy. Electronically Signed   By: Dasie Hamburg M.D.   On: 01/15/2024 15:47   CT Head Wo Contrast Result Date: 01/15/2024 CLINICAL DATA:  Blurred vision. EXAM: CT HEAD WITHOUT CONTRAST TECHNIQUE: Contiguous axial images were obtained from the base of the skull through the vertex without intravenous contrast. RADIATION DOSE REDUCTION: This exam was performed according to the departmental  dose-optimization program which includes automated exposure control, adjustment of the mA and/or kV according to patient size and/or use of iterative reconstruction technique. COMPARISON:  None Available. FINDINGS: Brain: Moderate generalized cerebral volume loss. Mild periventricular and deep cerebral white matter disease. No evidence of hemorrhage, mass, acute cortical infarct or hydrocephalus. Vascular: Calcific atheromatous disease within the carotid siphons. Skull: Intact and unremarkable. Sinuses/Orbits: No acute process. Other: None. IMPRESSION: Mild cerebral white matter disease. Electronically Signed   By: Evalene Coho M.D.   On: 01/15/2024 13:07     Procedures   Medications Ordered in the ED - No data to display  Clinical Course as of 01/15/24 1615  Thu Jan 15, 2024  1613 Laboratory workup shows no electrolyte imbalance.  CBC completely normal.  UA negative.  CT head and MRI brain show no evidence of acute stroke.  Patient's blood pressure has come down considerably now systolic in the 140s.  Her symptoms have resolved  completely.  Patient is normal she is no longer dizzy.  Steady on her feet.  Unclear cause of her symptoms earlier but most likely related to transient spike in blood pressure.  She will follow-up with her primary care doctor early next week.  Appropriate discharge at this time [MP]    Clinical Course User Index [MP] Pamella Ozell LABOR, DO                                 Medical Decision Making 73 year old female with history as above presenting to the ED given concern for blurred vision and imbalance with walking for the last 2 days.  Symptoms worsened this morning.  Outside of stroke window.  Significantly hypertensive at home.  Blurred vision has improved but she still is very unsteady when walking to the bathroom and does have some dysmetria on my exam.  No other neurologic deficit.  Presentation most concerning for potential posterior stroke.  Will obtain CT head  Noncon along with MRI.  Will additional obtain ED workup to look for any evidence of electrolyte balance, anemia or dysrhythmia as cause of her symptoms and continue to monitor here.  Amount and/or Complexity of Data Reviewed Labs: ordered. Radiology: ordered.        Final diagnoses:  Dizziness    ED Discharge Orders     None          Pamella Ozell LABOR, DO 01/15/24 1615

## 2024-01-15 NOTE — ED Notes (Signed)
 Patient transported to CT

## 2024-01-15 NOTE — ED Notes (Signed)
 ED Provider at bedside.

## 2024-01-15 NOTE — Discharge Instructions (Addendum)
 You were seen in the emergency department for dizziness, blurred vision and high blood pressure Your blood pressure came down while you were here and your symptoms resolved Your blood work EKG, CAT scan of your head and MRI of the head were completely normal It is not clear what caused your dizziness but it may have been related to the transient spike in your blood pressure It is important that you follow-up with your primary care doctor next week for reevaluation Continue taking all previous prescribed medications as directed Return to the emergency department for severe dizziness, headaches or any other concerns

## 2024-01-15 NOTE — ED Triage Notes (Addendum)
 Pt bib [facility] coming from home with CC of blurred vision x2 days with a break in symptoms. LWK 3pm yesterday. Pt reports blurred vision to both eyes but states right eye is work. No other reported deficits. Pt is hypertensive 210/100 with EMS. Pt not in blood thinners. Pt denies cp, sob, headache but reports dizziness. GCS 15.   EMS VS: 126 cbg 171/106 78 HR  17 RR 99% RA

## 2024-01-19 ENCOUNTER — Emergency Department (HOSPITAL_BASED_OUTPATIENT_CLINIC_OR_DEPARTMENT_OTHER)
Admission: EM | Admit: 2024-01-19 | Discharge: 2024-01-19 | Disposition: A | Discharge status: Home / Self Care | Attending: Student | Admitting: Student

## 2024-01-19 ENCOUNTER — Other Ambulatory Visit: Payer: Self-pay

## 2024-01-19 ENCOUNTER — Emergency Department (HOSPITAL_BASED_OUTPATIENT_CLINIC_OR_DEPARTMENT_OTHER)

## 2024-01-19 ENCOUNTER — Ambulatory Visit: Payer: Self-pay

## 2024-01-19 ENCOUNTER — Encounter (HOSPITAL_BASED_OUTPATIENT_CLINIC_OR_DEPARTMENT_OTHER): Payer: Self-pay

## 2024-01-19 DIAGNOSIS — K429 Umbilical hernia without obstruction or gangrene: Secondary | ICD-10-CM | POA: Diagnosis not present

## 2024-01-19 DIAGNOSIS — R1013 Epigastric pain: Secondary | ICD-10-CM | POA: Insufficient documentation

## 2024-01-19 DIAGNOSIS — J45909 Unspecified asthma, uncomplicated: Secondary | ICD-10-CM | POA: Diagnosis not present

## 2024-01-19 DIAGNOSIS — Z79899 Other long term (current) drug therapy: Secondary | ICD-10-CM | POA: Insufficient documentation

## 2024-01-19 DIAGNOSIS — R140 Abdominal distension (gaseous): Secondary | ICD-10-CM | POA: Insufficient documentation

## 2024-01-19 DIAGNOSIS — R1084 Generalized abdominal pain: Secondary | ICD-10-CM

## 2024-01-19 DIAGNOSIS — I10 Essential (primary) hypertension: Secondary | ICD-10-CM | POA: Diagnosis not present

## 2024-01-19 DIAGNOSIS — R160 Hepatomegaly, not elsewhere classified: Secondary | ICD-10-CM | POA: Diagnosis not present

## 2024-01-19 DIAGNOSIS — K5730 Diverticulosis of large intestine without perforation or abscess without bleeding: Secondary | ICD-10-CM | POA: Diagnosis not present

## 2024-01-19 DIAGNOSIS — K449 Diaphragmatic hernia without obstruction or gangrene: Secondary | ICD-10-CM | POA: Diagnosis not present

## 2024-01-19 LAB — URINALYSIS, ROUTINE W REFLEX MICROSCOPIC
Bilirubin Urine: NEGATIVE
Glucose, UA: NEGATIVE mg/dL
Hgb urine dipstick: NEGATIVE
Ketones, ur: NEGATIVE mg/dL
Leukocytes,Ua: NEGATIVE
Nitrite: NEGATIVE
Protein, ur: NEGATIVE mg/dL
Specific Gravity, Urine: 1.013 (ref 1.005–1.030)
pH: 6 (ref 5.0–8.0)

## 2024-01-19 LAB — CBC
HCT: 44.1 % (ref 36.0–46.0)
Hemoglobin: 14.9 g/dL (ref 12.0–15.0)
MCH: 31.2 pg (ref 26.0–34.0)
MCHC: 33.8 g/dL (ref 30.0–36.0)
MCV: 92.3 fL (ref 80.0–100.0)
Platelets: 181 K/uL (ref 150–400)
RBC: 4.78 MIL/uL (ref 3.87–5.11)
RDW: 12.9 % (ref 11.5–15.5)
WBC: 7.3 K/uL (ref 4.0–10.5)
nRBC: 0 % (ref 0.0–0.2)

## 2024-01-19 LAB — COMPREHENSIVE METABOLIC PANEL WITH GFR
ALT: 26 U/L (ref 0–44)
AST: 22 U/L (ref 15–41)
Albumin: 4.3 g/dL (ref 3.5–5.0)
Alkaline Phosphatase: 74 U/L (ref 38–126)
Anion gap: 12 (ref 5–15)
BUN: 19 mg/dL (ref 8–23)
CO2: 26 mmol/L (ref 22–32)
Calcium: 10.1 mg/dL (ref 8.9–10.3)
Chloride: 102 mmol/L (ref 98–111)
Creatinine, Ser: 0.91 mg/dL (ref 0.44–1.00)
GFR, Estimated: >60 mL/min (ref >60)
Glucose, Bld: 91 mg/dL (ref 70–99)
Potassium: 3.5 mmol/L (ref 3.5–5.1)
Sodium: 139 mmol/L (ref 135–145)
Total Bilirubin: 0.5 mg/dL (ref 0.0–1.2)
Total Protein: 7 g/dL (ref 6.5–8.1)

## 2024-01-19 LAB — TROPONIN T, HIGH SENSITIVITY: Troponin T High Sensitivity: <15 ng/L (ref <19)

## 2024-01-19 LAB — LIPASE, BLOOD: Lipase: 46 U/L (ref 11–51)

## 2024-01-19 MED ORDER — IOHEXOL 300 MG/ML  SOLN
100.0000 mL | Freq: Once | INTRAMUSCULAR | Status: AC | PRN
  Administered 2024-01-19: 85 mL via INTRAVENOUS

## 2024-01-19 MED ORDER — SODIUM CHLORIDE 0.9 % IV BOLUS
500.0000 mL | Freq: Once | INTRAVENOUS | Status: AC
  Administered 2024-01-19: 500 mL via INTRAVENOUS

## 2024-01-19 MED ORDER — DICYCLOMINE HCL 10 MG PO CAPS
10.0000 mg | ORAL_CAPSULE | Freq: Once | ORAL | Status: AC
  Administered 2024-01-19: 10 mg via ORAL
  Filled 2024-01-19: qty 1

## 2024-01-19 NOTE — ED Triage Notes (Signed)
 Pt c/o epigastric tightness onset last Wednesday, really weird symptoms. Advises she was in ER Thursday for stroke symptoms, did not mention abd pain. States, I feel like if I could just have a big bowel movement I'd be fine.

## 2024-01-19 NOTE — ED Provider Notes (Signed)
 [place] EMERGENCY DEPARTMENT AT [facility] Provider Note   CSN: [id number] Arrival date & time: 01/19/24  1443     Patient presents with: Abdominal Pain (epigastric)   Beth Peters is a 73 y.o. female.   73 year old female presents with complaint of a tight pain around her upper abdomen with abdominal bloating and decreased stool output.  Patient denies nausea or vomiting, fevers or chills or chest pain.  Patient was seen in the ER earlier last week for stroke workup after she was feeling off with visual disturbance and elevated blood pressures.  Had negative head MRI and was ultimately discharged.  Patient has tried taking MiraLAX  and is passing small stools.  Prior abdominal surgeries include heller myotomy for achalasia.        Prior to Admission medications   Medication Sig Start Date End Date Taking? Authorizing Provider  buPROPion  (WELLBUTRIN  XL) 150 MG 24 hr tablet TAKE 1 TABLET BY MOUTH EVERY DAY 11/07/23   Job Lukes, PA  CALCIUM PO Take 15 mLs by mouth daily.    [provider]  Cholecalciferol (VITAMIN D3) 1000 units CAPS Take 1,000 Units by mouth daily.    [provider]  diclofenac Sodium (VOLTAREN) 1 % GEL Voltaren 1 % topical gel    [provider]  docusate sodium  (COLACE) 100 MG capsule Take 100 mg by mouth 2 (two) times daily as needed for mild constipation.    [provider]  EPINEPHrine  0.3 mg/0.3 mL IJ SOAJ injection Inject 0.3 mg into the muscle as needed for anaphylaxis. Patient not taking: Reported on 11/20/2023 10/31/22   Job Lukes, PA  esomeprazole (NEXIUM) 40 MG capsule Take 40 mg by mouth See admin instructions. Take 40 mg by mouth daily. May take additional 40 mg if needed for acid reflux    [provider]  guaiFENesin -codeine  100-10 MG/5ML syrup Take 5 mLs by mouth 3 (three) times daily as needed for cough. 11/10/23   Job Lukes, PA  hydrochlorothiazide  (HYDRODIURIL ) 25 MG tablet  TAKE 1 TABLET (25 MG TOTAL) BY MOUTH DAILY. 11/10/23   Job Lukes, PA  venlafaxine  XR (EFFEXOR -XR) 37.5 MG 24 hr capsule Take 1 capsule (37.5 mg total) by mouth daily. 10/23/23   Prentiss Annabella LABOR, NP  NAPOLEON INHUB 250-50 MCG/ACT AEPB INHALE 1 PUFF INTO THE LUNGS IN THE MORNING AND AT BEDTIME. 07/04/23   Job Lukes, PA    Allergies: Dilaudid [hydromorphone hcl], Glucagon, Lactose intolerance (gi), Wasp venom, and Wasp venom protein    Review of Systems Negative except as per HPI Updated Vital Signs BP (!) 166/129   Pulse 77   Temp 98.3 F (36.8 C) (Oral)   Resp (!) 22   LMP 09/23/2012   SpO2 100%   Physical Exam Vitals and nursing note reviewed.  Constitutional:      General: She is not in acute distress.    Appearance: She is well-developed. She is not diaphoretic.  HENT:     Head: Normocephalic and atraumatic.  Cardiovascular:     Rate and Rhythm: Normal rate and regular rhythm.     Heart sounds: Normal heart sounds.  Pulmonary:     Effort: Pulmonary effort is normal.  Abdominal:     General: Bowel sounds are normal.     Tenderness: There is abdominal tenderness in the epigastric area.  Musculoskeletal:     Right lower leg: No edema.     Left lower leg: No edema.  Skin:    General: Skin  is warm and dry.     Findings: No erythema or rash.  Neurological:     Mental Status: She is alert and oriented to person, place, and time.  Psychiatric:        Behavior: Behavior normal.     (all labs ordered are listed, but only abnormal results are displayed) Labs Reviewed  LIPASE, BLOOD  COMPREHENSIVE METABOLIC PANEL WITH GFR  CBC  URINALYSIS, ROUTINE W REFLEX MICROSCOPIC  TROPONIN T, HIGH SENSITIVITY  TROPONIN T, HIGH SENSITIVITY    EKG: EKG Interpretation Date/Time:  Monday January 19 2024 18:13:30 EDT Ventricular Rate:  79 PR Interval:  153 QRS Duration:  94 QT Interval:  372 QTC Calculation: 427 R Axis:   -26  Text Interpretation: Sinus rhythm LAE,  consider biatrial enlargement Abnormal R-wave progression, early transition Left ventricular hypertrophy Confirmed by Kommor, [place] ([id number]) on 01/19/2024 7:47:32 PM  Radiology: CT ABDOMEN PELVIS W CONTRAST Result Date: 01/19/2024 CLINICAL DATA:  Epigastric tightness EXAM: CT ABDOMEN AND PELVIS WITH CONTRAST TECHNIQUE: Multidetector CT imaging of the abdomen and pelvis was performed using the standard protocol following bolus administration of intravenous contrast. RADIATION DOSE REDUCTION: This exam was performed according to the departmental dose-optimization program which includes automated exposure control, adjustment of the mA and/or kV according to patient size and/or use of iterative reconstruction technique. CONTRAST:  85mL OMNIPAQUE  IOHEXOL  300 MG/ML  SOLN COMPARISON:  CT abdomen pelvis May 21, 2018. FINDINGS: Lower chest: Hiatal hernia and patulous distal esophagus. No pleural effusion. No suspicious pulmonary nodule in the lower lung fields Hepatobiliary: Hepatomegaly measuring 17.8 cm in craniocaudal dimension. Left lobe is extending to left subdiaphragmatic. No focal liver abnormality is seen. No gallstones, gallbladder wall thickening, or biliary dilatation. Pancreas: Unremarkable. No pancreatic ductal dilatation or surrounding inflammatory changes. Spleen: Normal in size without focal abnormality. Adrenals/Urinary Tract: Adrenal glands are unremarkable. Kidneys are normal, without renal calculi, focal lesion, or hydronephrosis. Bladder is unremarkable. Stomach/Bowel: Hiatal hernia. Stomach is within normal limits. Appendix normal. Scattered colonic diverticula in the sigmoid region without diverticulitis. No evidence of bowel wall thickening, distention, or inflammatory changes. Vascular/Lymphatic: Perigastric lymph node measuring 8 mm not enlarged by CT size criteria. Circumaortic left renal vein common normal variation. Reproductive: Hysterectomy and oophorectomy. No new adnexal mass. Normal  vaginal cuff. Other: Small fat containing umbilical hernia with a defect measuring 1.2 cm. Small fat containing left inguinal hernia. No abdominopelvic ascites. Musculoskeletal: Multilevel degenerative changes of the spine with narrowing of disc spaces, retrolisthesis of L5-S1 and L2-L3. IMPRESSION: Hiatal hernia and patulous distal esophagus, similar to prior. No acute abnormality in the abdomen. Hepatomegaly without focal liver lesion. Circumaortic left renal vein, normal variation. Hysterectomy and bilateral oophorectomy. Electronically Signed   By: Megan  Zare M.D.   On: 01/19/2024 19:30     Procedures   Medications Ordered in the ED  sodium chloride  0.9 % bolus 500 mL (0 mLs Intravenous Stopped 01/19/24 1953)  iohexol  (OMNIPAQUE ) 300 MG/ML solution 100 mL (85 mLs Intravenous Contrast Given 01/19/24 1858)  dicyclomine  (BENTYL ) capsule 10 mg (10 mg Oral Given 01/19/24 1959)                                    Medical Decision Making Amount and/or Complexity of Data Reviewed Labs: ordered. Radiology: ordered.  Risk Prescription drug management.   This patient presents to the ED for concern of abdominal bloating, upper abdominal discomfort, this involves  an extensive number of treatment options, and is a complaint that carries with it a high risk of complications and morbidity.  The differential diagnosis includes but not limited to SBO, arrhythmia, ACS, gastritis, GERD, mass   Co morbidities / Chronic conditions that complicate the patient evaluation  Hypertension, asthma, osteoarthritis, scoliosis, anemia, Raynaud's, achalasia   Additional history obtained:  Additional history obtained from EMR External records from outside source obtained and reviewed including prior labs and imaging on file   Lab Tests:  I Ordered, and personally interpreted labs.  The pertinent results include: CBC within normal notes.  CMP within normals.  Lipase normal.  Urinalysis normal.  Troponin negative  at less than 15   Imaging Studies ordered:  I ordered imaging studies including CT abdomen pelvis with contrast I independently visualized and interpreted imaging which showed no acute process I agree with the radiologist interpretation   Cardiac Monitoring: / EKG:  The patient was maintained on a cardiac monitor.  I personally viewed and interpreted the cardiac monitored which showed an underlying rhythm of: Sinus rhythm, rate 79   Problem List / ED Course / Critical interventions / Medication management  73 year old female with complaint of a tight band around her upper abdominal area as well as abdominal distention. Symptoms started around last week, had hypertensive emergency with visual disturbance resulting in ER work up for stroke last Thursday. On exam, TTP epigastric area. Labs WNL. CT without acute findings to explain patient's symptoms tonight, provided with Bentyl . Recommend recheck with PCP.  I ordered medication including bentyl    Reevaluation of the patient after these medicines showed that the patient remained stable I have reviewed the patients home medicines and have made adjustments as needed   Consultations Obtained:  I requested consultation with the ER attending, Dr. Albertina,  and discussed lab and imaging findings as well as pertinent plan - they recommend: recommends Bentyl     Social Determinants of Health:  Has PCP   Test / Admission - Considered:  Stable for dc      Final diagnoses:  Generalized abdominal pain    ED Discharge Orders     None          Beverley Leita DELENA DEVONNA 01/19/24 2314    Albertina Dixon, MD 01/20/24 1730

## 2024-01-19 NOTE — Telephone Encounter (Signed)
 Noted

## 2024-01-19 NOTE — Discharge Instructions (Addendum)
 Your work up today is reassuring.  Please follow up with your primary care provider.  Return to the ER for worsening or concerning symptoms.

## 2024-01-19 NOTE — Telephone Encounter (Signed)
 No appointments available in PCP office--Patient states that she is going to go to Urgent Care.  FYI Only or Action Required?: FYI only for provider.  Patient was last seen in primary care on 11/10/2023 by Job Lukes, PA. Called Nurse Triage reporting Upper Abdominal Pain. Symptoms began 4 days ago. Interventions attempted: Other: Patient states she took a laxative Saturday night which helped somewhat. Symptoms are: gradually worsening.  Triage Disposition: See HCP Within 4 Hours (Or PCP Triage)  Patient/caregiver understands and will follow disposition?: Yes            Copied from CRM [id number]. Topic: Clinical - Red Word Triage >> Jan 19, 2024  9:53 AM Elle L wrote: Red Word that prompted transfer to Nurse Triage: The patient went to the hospital on 7/3 due to symptoms of stroke but was discharged that night. However, the patient states she has pressure in her chest that is tight and uncomfortable. Reason for Disposition  [1] MILD-MODERATE pain AND [2] constant AND [3] present > 2 hours  Answer Assessment - Initial Assessment Questions 1. LOCATION: Where does it hurt?      Upper abdomen around diaphragm per patient 2. RADIATION: Does the pain shoot anywhere else? (e.g., chest, back)     N/a 3. ONSET: When did the pain begin? (e.g., minutes, hours or days ago)      4 days ago--patient advised ER staff about it while she was there  4. SUDDEN: Gradual or sudden onset?     ----- 5. PATTERN Does the pain come and go, or is it constant?    - If it comes and goes: How long does it last? Do you have pain now?     (Note: Comes and goes means the pain is intermittent. It goes away completely between bouts.)    - If constant: Is it getting better, staying the same, or getting worse?      (Note: Constant means the pain never goes away completely; most serious pain is constant and gets worse.)      constant 6. SEVERITY: How bad is the pain?  (e.g., Scale 1-10; mild,  moderate, or severe)    - MILD (1-3): Doesn't interfere with normal activities, abdomen soft and not tender to touch..     - MODERATE (4-7): Interferes with normal activities or awakens from sleep, abdomen tender to touch.     - SEVERE (8-10): Excruciating pain, doubled over, unable to do any normal activities.       Patient states no pain but the pressure  7. RECURRENT SYMPTOM: Have you ever had this type of stomach pain before? If Yes, ask: When was the last time? and What happened that time?      Patient states she feels constipated 8. AGGRAVATING FACTORS: Does anything seem to cause this pain? (e.g., foods, stress, alcohol)     ----- 9. CARDIAC SYMPTOMS: Do you have any of the following symptoms: chest pain, difficulty breathing, sweating, nausea?     Patient states the pain is upper abdomen around her diaphragm 10. OTHER SYMPTOMS: Do you have any other symptoms? (e.g., back pain, diarrhea, fever, urination pain, vomiting)    Patient states she just feels uncomfortable.   Denies: dizziness, vomiting, vision problems, difficulty breathing   Patient states she is not having pain but just feels uncomfortable pressure in her upper abdomen. No openings available in PCP office today. Patient states that she will go to Urgent Care. Patient is advised that if anything gets worse  to go to the Emergency Room immediately for further evaluation. Patient verbalized understanding.  Protocols used: Abdominal Pain - Upper-A-AH

## 2024-01-20 ENCOUNTER — Ambulatory Visit (INDEPENDENT_AMBULATORY_CARE_PROVIDER_SITE_OTHER): Admitting: Family Medicine

## 2024-01-20 ENCOUNTER — Encounter: Payer: Self-pay | Admitting: Family Medicine

## 2024-01-20 VITALS — BP 136/85 | HR 81 | Temp 97.2°F | Wt 154.8 lb

## 2024-01-20 DIAGNOSIS — K220 Achalasia of cardia: Secondary | ICD-10-CM | POA: Diagnosis not present

## 2024-01-20 DIAGNOSIS — K2100 Gastro-esophageal reflux disease with esophagitis, without bleeding: Secondary | ICD-10-CM

## 2024-01-20 DIAGNOSIS — F410 Panic disorder [episodic paroxysmal anxiety] without agoraphobia: Secondary | ICD-10-CM | POA: Diagnosis not present

## 2024-01-20 DIAGNOSIS — I10 Essential (primary) hypertension: Secondary | ICD-10-CM | POA: Diagnosis not present

## 2024-01-20 MED ORDER — HYDRALAZINE HCL 10 MG PO TABS: 10.0000 mg | ORAL_TABLET | Freq: Three times a day (TID) | ORAL | 4 refills | Status: AC | PRN

## 2024-01-20 MED ORDER — HYDROXYZINE HCL 50 MG PO TABS: 25.0000 mg | ORAL_TABLET | Freq: Three times a day (TID) | ORAL | 0 refills | Status: DC | PRN

## 2024-01-20 MED ORDER — DICYCLOMINE HCL 10 MG PO CAPS: 10.0000 mg | ORAL_CAPSULE | Freq: Three times a day (TID) | ORAL | 0 refills | Status: DC

## 2024-01-20 NOTE — Progress Notes (Signed)
 Beth Peters is a 73 y.o. female who presents today for an office visit.  Assessment/Plan:  Essential Hypertension  Blood pressure is well-controlled today.  Unclear etiology for her recent significant elevation in readings however she is concerned that it is due to a panic attack.  We are addressing this as below.  She is currently on HCTZ 25 mg daily and tolerating well.  Discussed with patient that given her normally well-controlled baseline we will be hesitant to add on any other antihypertensives at this point.  She would like to have something to use as needed in case she has another significant elevation of blood pressure readings.  Will send in hydralazine  10 mg 3 times daily as needed for elevated blood pressure readings.  She will let us  know if she has to use this periodically though anticipate that she will hopefully not have any further issues with this going forward with treatment for her stress and panic disorder as below.  Stress/panic disorder Patient's episode last week was consistent with her previous panic attacks.  Her last severe episode like this was about 20 years ago relating to her father's cardiac surgery.  She does admit to being under more stress recently as well.  She would like to have something on hand to use as needed for flareups of stress and panic in the future.  We did discuss treatment options.  Given her relatively sporadic episodes would be reasonable to start as needed hydroxyzine  at this point.  We did discuss potential side effects.  Will defer further management to PCP though if she does need to use this frequently would likely benefit from starting SSRI or other daily medication.  Abdominal pain/GERD/achalasia/hiatal hernia Reassuring exam today.  We reviewed her recent workup in the ED including CT scan which was notable for hiatal hernia with patulous distal esophagus related to her achalasia.  Her symptoms have resolved since being in the ED  yesterday.  It is likely that her pain was probably related to her hiatal hernia and achalasia which may flared up with her recent increase in stress.  Her exam in the ED was otherwise negative.  She did have improvement with Bentyl  in the ED and we will refill this for her today.  We also did discuss PPI.  She is on Nexium 40 mg daily.  We discussed switching this to Protonix  however she would like to try twice daily Nexium dosing first per recommendation of her gastroenterologist.  She will let us  know if symptoms return or do not improve with this.  Will defer further management to gastroenterology who are planning on surgical management at some point soon.    Subjective:  HPI:  See Assessment / plan for status of chronic conditions. Patient is here today for Emergency Department follow up.  She initially went to the ED 5 days ago with blurred vision for a couple of days with dizziness and imbalance.  Checked blood pressure at home and was found to be in the 200s.  In the emergency room had workup including EKG labs, and imaging.  Her workup was normal and blood pressure improved while in the ED with systolics in the 140s.  She does note that she had something similar happen about 20 years ago and was told that she had a panic attack. She does admit to having more stress recently.    A few days later developed upper abdominal pain and went back to the ED.  She had labs  again at that time including blood work, EKG, and CT abdomen which were negative.  She was started on Bentyl  and discharged home. Pain has improved over the last day since being home.        Objective:  Physical Exam: BP 136/85   Pulse 81   Temp (!) 97.2 F (36.2 C) (Temporal)   Wt 154 lb 12.8 oz (70.2 kg)   LMP 09/23/2012   SpO2 100%   BMI 29.25 kg/m   Gen: No acute distress, resting comfortably CV: Regular rate and rhythm with no murmurs appreciated Pulm: Normal work of breathing, clear to auscultation bilaterally with  no crackles, wheezes, or rhonchi Abdomen: Bowel sounds present, soft, nontender, nondistended.  No rebound or guarding. Neuro: Grossly normal, moves all extremities Psych: Normal affect and thought content  Time Spent: 45 minutes of total time was spent on the date of the encounter performing the following actions: chart review prior to seeing the patient, obtaining history, performing a medically necessary exam, counseling on the treatment plan, placing orders, and documenting in our EHR.        Worth HERO. Kennyth, MD 01/20/2024 2:10 PM

## 2024-01-20 NOTE — Patient Instructions (Signed)
 It was very nice to see you today!  Please use the hydroxyzine  as needed for any anxiety attacks.  Let us  know in a few weeks how this is working for you  I will send a prescription in for hydralazine  to use as needed if you have any elevated blood pressure readings.  Please let me or your PCP know if you have to use this frequently.   I will send a prescription in for the Bentyl .  Please increase your Nexium to twice daily.  Let us  know if you have any return of symptoms.  Return if symptoms worsen or fail to improve.   Take care, Dr Kennyth  PLEASE NOTE:  If you had any lab tests, please let us  know if you have not heard back within a few days. You may see your results on mychart before we have a chance to review them but we will give you a call once they are reviewed by us .   If we ordered any referrals today, please let us  know if you have not heard from their office within the next week.   If you had any urgent prescriptions sent in today, please check with the pharmacy within an hour of our visit to make sure the prescription was transmitted appropriately.   Please try these tips to maintain a healthy lifestyle:  Eat at least 3 REAL meals and 1-2 snacks per day.  Aim for no more than 5 hours between eating.  If you eat breakfast, please do so within one hour of getting up.   Each meal should contain half fruits/vegetables, one quarter protein, and one quarter carbs (no bigger than a computer mouse)  Cut down on sweet beverages. This includes juice, soda, and sweet tea.   Drink at least 1 glass of water with each meal and aim for at least 8 glasses per day  Exercise at least 150 minutes every week.

## 2024-02-03 ENCOUNTER — Other Ambulatory Visit: Payer: Self-pay | Admitting: Physician Assistant

## 2024-02-11 ENCOUNTER — Other Ambulatory Visit: Payer: Self-pay | Admitting: Family Medicine

## 2024-02-12 ENCOUNTER — Other Ambulatory Visit: Payer: Self-pay | Admitting: Family Medicine

## 2024-02-17 DIAGNOSIS — D2261 Melanocytic nevi of right upper limb, including shoulder: Secondary | ICD-10-CM | POA: Diagnosis not present

## 2024-02-17 DIAGNOSIS — D485 Neoplasm of uncertain behavior of skin: Secondary | ICD-10-CM | POA: Diagnosis not present

## 2024-02-17 DIAGNOSIS — L821 Other seborrheic keratosis: Secondary | ICD-10-CM | POA: Diagnosis not present

## 2024-02-17 DIAGNOSIS — Z85828 Personal history of other malignant neoplasm of skin: Secondary | ICD-10-CM | POA: Diagnosis not present

## 2024-02-17 DIAGNOSIS — D2372 Other benign neoplasm of skin of left lower limb, including hip: Secondary | ICD-10-CM | POA: Diagnosis not present

## 2024-02-17 DIAGNOSIS — D2371 Other benign neoplasm of skin of right lower limb, including hip: Secondary | ICD-10-CM | POA: Diagnosis not present

## 2024-02-18 DIAGNOSIS — M25511 Pain in right shoulder: Secondary | ICD-10-CM | POA: Diagnosis not present

## 2024-02-18 DIAGNOSIS — M25512 Pain in left shoulder: Secondary | ICD-10-CM | POA: Diagnosis not present

## 2024-03-31 DIAGNOSIS — L988 Other specified disorders of the skin and subcutaneous tissue: Secondary | ICD-10-CM | POA: Diagnosis not present

## 2024-03-31 DIAGNOSIS — Z85828 Personal history of other malignant neoplasm of skin: Secondary | ICD-10-CM | POA: Diagnosis not present

## 2024-03-31 DIAGNOSIS — D485 Neoplasm of uncertain behavior of skin: Secondary | ICD-10-CM | POA: Diagnosis not present

## 2024-04-16 ENCOUNTER — Other Ambulatory Visit: Payer: Self-pay | Admitting: Family Medicine

## 2024-05-04 ENCOUNTER — Other Ambulatory Visit: Payer: Self-pay | Admitting: Physician Assistant

## 2024-05-04 ENCOUNTER — Encounter: Payer: Self-pay | Admitting: Physician Assistant

## 2024-05-04 ENCOUNTER — Ambulatory Visit: Admitting: Physician Assistant

## 2024-05-04 VITALS — BP 160/90 | HR 70 | Temp 98.1°F | Ht 61.0 in | Wt 154.0 lb

## 2024-05-04 DIAGNOSIS — H6993 Unspecified Eustachian tube disorder, bilateral: Secondary | ICD-10-CM | POA: Diagnosis not present

## 2024-05-04 DIAGNOSIS — F410 Panic disorder [episodic paroxysmal anxiety] without agoraphobia: Secondary | ICD-10-CM

## 2024-05-04 DIAGNOSIS — I10 Essential (primary) hypertension: Secondary | ICD-10-CM | POA: Diagnosis not present

## 2024-05-04 DIAGNOSIS — J0110 Acute frontal sinusitis, unspecified: Secondary | ICD-10-CM | POA: Diagnosis not present

## 2024-05-04 LAB — POC COVID19 BINAXNOW: SARS Coronavirus 2 Ag: NEGATIVE

## 2024-05-04 MED ORDER — DOXYCYCLINE HYCLATE 100 MG PO TABS: 100.0000 mg | ORAL_TABLET | Freq: Two times a day (BID) | ORAL | 0 refills | Status: DC

## 2024-05-04 MED ORDER — BUPROPION HCL ER (XL) 150 MG PO TB24: 150.0000 mg | ORAL_TABLET | Freq: Every day | ORAL | 1 refills | Status: AC

## 2024-05-04 MED ORDER — HYDROCHLOROTHIAZIDE 25 MG PO TABS: 25.0000 mg | ORAL_TABLET | Freq: Every day | ORAL | 1 refills | Status: AC

## 2024-05-04 NOTE — Progress Notes (Signed)
 Beth Peters is a 73 y.o. female here for a follow up of a pre-existing problem.  History of Present Illness:   Chief Complaint  Patient presents with   Sinus Problem    Pt c/o sinus pressure and headache, bilateral ear discomfort left worse than right, nasal drainage light yellow, coughing and expectorating dark yellow, has been going 2-3 weeks, she is using Dayquil, Nyquil, Mucinex .   Beth Peters is a 73 year old female who presents with worsening upper respiratory symptoms.  She experiences congestion and fatigue, describing her condition as feeling 'full of gunk' and 'a truck hit me' upon waking. Her symptoms worsened after a busy weekend outdoors. She uses NyQuil and DayQuil for relief. She has left-sided facial discomfort and chronic eustachian tube issues, particularly on the left side, causing a sensation of 'talking in a cave' and constant drainage. She has not used nasal sprays or antihistamines for years.  She has hypertension and experienced a hypertensive crisis in July with blood pressure reaching 220/unknown, dizziness, shakiness, and visual disturbances. Her blood pressure decreased to 153/unknown after emergency evaluation. She was prescribed hydralazine  as needed but has not used it since. Her current medications include Wellbutrin  150 mg and hydrochlorothiazide . She uses a Wixela inhaler but lacks a rescue inhaler.  She has achalasia and recalls significant ear pain during a recent flight.   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   OA (osteoarthritis)    Osteopenia    PONV (postoperative nausea and vomiting)    Raynauds syndrome    Seasonal affective disorder    SUI (stress urinary incontinence,  female)    Superficial thrombophlebitis    x2  left lower leg superficial in  2006  prior to vein stripping      Social History   Tobacco Use   Smoking status: Never   Smokeless tobacco: Never  Vaping Use   Vaping status: Never Used  Substance Use Topics   Alcohol use: Yes    Alcohol/week: 8.0 standard drinks of alcohol    Types: 8 Glasses of wine per week   Drug use: No    Past Surgical History:  Procedure Laterality Date   CYSTOSCOPY N/A 07/14/2018   Procedure: CYSTOSCOPY;  Surgeon: Jannis Kate Norris, MD;  Location: [facility];  Service: Gynecology;  Laterality: N/A;   D & C HYSTEROSCOPY W/ RESECTION FIBROID  09-21-2002  dr fredrik sharps  [facility]   D & C HYSTEROSCOPY W/ RESECTION POLYP  12-24-2010  dr mandy  [facility]   DILATION AND CURETTAGE OF UTERUS     ESOPHAGEAL DILATION  2013  --- [facility]   Omega dilation for Achalasia   ESOPHAGOGASTRODUODENOSCOPY N/A 09/20/2017   Procedure: ESOPHAGOGASTRODUODENOSCOPY (EGD);  Surgeon: Rosalie Kitchens, MD;  Location: [facility];  Service: Endoscopy;  Laterality: N/A;   ESOPHAGOGASTRODUODENOSCOPY (EGD) WITH PROPOFOL  N/A 04/13/2023   Procedure: ESOPHAGOGASTRODUODENOSCOPY (EGD) WITH PROPOFOL ;  Surgeon: Rosalie Kitchens, MD;  Location: [facility];  Service: Gastroenterology;  Laterality: N/A;   FOREIGN BODY REMOVAL  04/13/2023   Procedure: FOREIGN BODY REMOVAL;  Surgeon: Rosalie Kitchens, MD;  Location: [facility];  Service: Gastroenterology;;   ARDIS SAR  2006  fundoplication repair hiatal hernia   KNEE ARTHROSCOPY Left 2006 &  09-27-2003   KNEE ARTHROSCOPY W/ SYNOVECTOMY Right 09-20-2009  dr melodi  [facility]   KNEE SURGERY Right 1962   RECTOCELE REPAIR N/A 07/14/2018   Procedure: POSTERIOR REPAIR (RECTOCELE);  Surgeon: Jertson, Jill Evelyn, MD;  Location: [facility]  Service: Gynecology;  Laterality: N/A;   ROTATOR CUFF REPAIR Right 09/2011   skin cancker  01/12/2022   face   TONSILLECTOMY AND ADENOIDECTOMY  1961   TOTAL  KNEE ARTHROPLASTY Bilateral left 02-05-2006 ;  right 08-14-2007   dr melodi  [facility]   TOTAL KNEE REVISION Right 11/26/2017   Procedure: Right knee polyethylene revision;  Surgeon: melodi Lerner, MD;  Location: [facility];  Service: Orthopedics;  Laterality: Right;  Adductor Block   VARICOSE VEIN SURGERY  09/2005    Family History  Problem Relation Age of Onset   Hypertension Father    Heart disease Father    Stroke Father    Cancer Father        Testicular, Bladder, Prostate, Lung   Heart disease Mother    Stroke Mother    Osteoporosis Mother    Uterine cancer Sister 55   Breast cancer Sister    Cancer Sister        rectal, liver,lymph nodes   Diabetes Maternal Grandfather    Heart disease Maternal Grandfather    Diabetes Paternal Grandmother    Breast cancer Maternal Aunt     Allergies  Allergen Reactions   Dilaudid [Hydromorphone Hcl] Hives, Shortness Of Breath and Itching   Glucagon Anaphylaxis   Lactose Intolerance (Gi)    Wasp Venom Itching and Swelling   Wasp Venom Protein Itching and Hives    Current Medications:   Current Outpatient Medications:    CALCIUM PO, Take 15 mLs by mouth daily., Disp: , Rfl:    Cholecalciferol (VITAMIN D3) 1000 units CAPS, Take 1,000 Units by mouth daily., Disp: , Rfl:    diclofenac Sodium (VOLTAREN) 1 % GEL, Voltaren 1 % topical gel, Disp: , Rfl:    dicyclomine  (BENTYL ) 10 MG capsule, TAKE 1 CAPSULE (10 MG TOTAL) BY MOUTH 4 TIMES A DAY BEFORE MEALS AND AT BEDTIME (Patient taking differently: as needed.), Disp: 360 capsule, Rfl: 1   docusate sodium  (COLACE) 100 MG capsule, Take 100 mg by mouth 2 (two) times daily as needed for mild constipation., Disp: , Rfl:    doxycycline  (VIBRA -TABS) 100 MG tablet, Take 1 tablet (100 mg total) by mouth 2 (two) times daily., Disp: 14 tablet, Rfl: 0   EPINEPHrine  0.3 mg/0.3 mL IJ SOAJ injection, Inject 0.3 mg into the muscle as needed for anaphylaxis., Disp: 1 each, Rfl: 1   esomeprazole (NEXIUM) 40 MG  capsule, Take 40 mg by mouth See admin instructions. Take 40 mg by mouth daily. May take additional 40 mg if needed for acid reflux, Disp: , Rfl:    hydrALAZINE  (APRESOLINE ) 10 MG tablet, Take 1 tablet (10 mg total) by mouth 3 (three) times daily as needed (use as needed for SBP>180)., Disp: 90 tablet, Rfl: 4   hydrOXYzine  (ATARAX ) 50 MG tablet, TAKE 0.5-1 TABLET (25-50 MG TOTAL) BY MOUTH 3 (THREE) TIMES DAILY AS NEEDED FOR ANXIETY., Disp: 270 tablet, Rfl: 1   venlafaxine  XR (EFFEXOR -XR) 37.5 MG 24 hr capsule, Take 1 capsule (37.5 mg total) by mouth daily., Disp: 90 capsule, Rfl: 3   WIXELA INHUB 250-50 MCG/ACT AEPB, INHALE 1 PUFF INTO THE LUNGS IN THE MORNING AND  AT BEDTIME., Disp: 60 each, Rfl: 2   buPROPion  (WELLBUTRIN  XL) 150 MG 24 hr tablet, Take 1 tablet (150 mg total) by mouth daily., Disp: 90 tablet, Rfl: 1   hydrochlorothiazide  (HYDRODIURIL ) 25 MG tablet, Take 1 tablet (25 mg total) by mouth daily., Disp: 90 tablet, Rfl: 1   Review of Systems:   Negative unless otherwise specified per HPI.  Vitals:   Vitals:   05/04/24 1349 05/04/24 1415  BP: (!) 140/90 (!) 160/90  Pulse: 70   Temp: 98.1 F (36.7 C)   TempSrc: Temporal   SpO2: 96%   Weight: 154 lb (69.9 kg)   Height: 5' 1 (1.549 m)      Body mass index is 29.1 kg/m.  Physical Exam:   Physical Exam Vitals and nursing note reviewed.  Constitutional:      General: She is not in acute distress.    Appearance: She is well-developed. She is not ill-appearing or toxic-appearing.  Cardiovascular:     Rate and Rhythm: Normal rate and regular rhythm.     Pulses: Normal pulses.     Heart sounds: Normal heart sounds, S1 normal and S2 normal.  Pulmonary:     Effort: Pulmonary effort is normal.     Breath sounds: Normal breath sounds.  Skin:    General: Skin is warm and dry.  Neurological:     Mental Status: She is alert.     GCS: GCS eye subscore is 4. GCS verbal subscore is 5. GCS motor subscore is 6.  Psychiatric:         Speech: Speech normal.        Behavior: Behavior normal. Behavior is cooperative.     Assessment and Plan:   Assessment and Plan  Eustachian tube dysfunction; Acute non-recurrent frontal sinusitis  Persistent left-sided blockage and drainage, possibly related to reflux, exacerbated by travel. - Refer to ENT, Dr. Karis, for further evaluation and management. - Prescribe doxycycline  for potential bacterial infection.  Essential hypertension Previous hypertensive crisis in July, currently well-managed with hydrochlorothiazide  and hydralazine  as needed. - Continue hydrochlorothiazide  25 mg daily - recommend close monitoring of home blood pressure and follow up if readings consistently >140/90  Panic disorder Episodes of anxiety and panic, particularly under stress. - Refill Wellbutrin  150 mg daily  Jamai Dolce, PA-C

## 2024-05-31 ENCOUNTER — Encounter (HOSPITAL_BASED_OUTPATIENT_CLINIC_OR_DEPARTMENT_OTHER): Payer: Self-pay | Admitting: Emergency Medicine

## 2024-05-31 ENCOUNTER — Emergency Department (HOSPITAL_BASED_OUTPATIENT_CLINIC_OR_DEPARTMENT_OTHER)

## 2024-05-31 ENCOUNTER — Emergency Department (HOSPITAL_BASED_OUTPATIENT_CLINIC_OR_DEPARTMENT_OTHER)
Admission: EM | Admit: 2024-05-31 | Discharge: 2024-05-31 | Disposition: A | Discharge status: Home / Self Care | Attending: Emergency Medicine | Admitting: Emergency Medicine

## 2024-05-31 ENCOUNTER — Other Ambulatory Visit: Payer: Self-pay

## 2024-05-31 DIAGNOSIS — R1011 Right upper quadrant pain: Secondary | ICD-10-CM | POA: Diagnosis not present

## 2024-05-31 DIAGNOSIS — K449 Diaphragmatic hernia without obstruction or gangrene: Secondary | ICD-10-CM | POA: Diagnosis not present

## 2024-05-31 DIAGNOSIS — K429 Umbilical hernia without obstruction or gangrene: Secondary | ICD-10-CM | POA: Diagnosis not present

## 2024-05-31 DIAGNOSIS — R160 Hepatomegaly, not elsewhere classified: Secondary | ICD-10-CM | POA: Diagnosis not present

## 2024-05-31 DIAGNOSIS — J45909 Unspecified asthma, uncomplicated: Secondary | ICD-10-CM | POA: Insufficient documentation

## 2024-05-31 DIAGNOSIS — K5900 Constipation, unspecified: Secondary | ICD-10-CM | POA: Insufficient documentation

## 2024-05-31 DIAGNOSIS — R109 Unspecified abdominal pain: Secondary | ICD-10-CM | POA: Diagnosis present

## 2024-05-31 DIAGNOSIS — Z79899 Other long term (current) drug therapy: Secondary | ICD-10-CM | POA: Diagnosis not present

## 2024-05-31 LAB — CBC WITH DIFFERENTIAL/PLATELET
Abs Immature Granulocytes: 0.02 K/uL (ref 0.00–0.07)
Basophils Absolute: 0.1 K/uL (ref 0.0–0.1)
Basophils Relative: 1 %
Eosinophils Absolute: 0.1 K/uL (ref 0.0–0.5)
Eosinophils Relative: 2 %
HCT: 42.9 % (ref 36.0–46.0)
Hemoglobin: 14.8 g/dL (ref 12.0–15.0)
Immature Granulocytes: 0 %
Lymphocytes Relative: 30 %
Lymphs Abs: 1.8 K/uL (ref 0.7–4.0)
MCH: 31.2 pg (ref 26.0–34.0)
MCHC: 34.5 g/dL (ref 30.0–36.0)
MCV: 90.5 fL (ref 80.0–100.0)
Monocytes Absolute: 0.5 K/uL (ref 0.1–1.0)
Monocytes Relative: 8 %
Neutro Abs: 3.6 K/uL (ref 1.7–7.7)
Neutrophils Relative %: 59 %
Platelets: 196 K/uL (ref 150–400)
RBC: 4.74 MIL/uL (ref 3.87–5.11)
RDW: 12.7 % (ref 11.5–15.5)
WBC: 6 K/uL (ref 4.0–10.5)
nRBC: 0 % (ref 0.0–0.2)

## 2024-05-31 LAB — COMPREHENSIVE METABOLIC PANEL WITH GFR
ALT: 16 U/L (ref 0–44)
AST: 17 U/L (ref 15–41)
Albumin: 4.5 g/dL (ref 3.5–5.0)
Alkaline Phosphatase: 77 U/L (ref 38–126)
Anion gap: 9 (ref 5–15)
BUN: 23 mg/dL (ref 8–23)
CO2: 31 mmol/L (ref 22–32)
Calcium: 10.1 mg/dL (ref 8.9–10.3)
Chloride: 98 mmol/L (ref 98–111)
Creatinine, Ser: 0.83 mg/dL (ref 0.44–1.00)
GFR, Estimated: >60 mL/min (ref >60)
Glucose, Bld: 71 mg/dL (ref 70–99)
Potassium: 3.6 mmol/L (ref 3.5–5.1)
Sodium: 138 mmol/L (ref 135–145)
Total Bilirubin: 0.4 mg/dL (ref 0.0–1.2)
Total Protein: 6.9 g/dL (ref 6.5–8.1)

## 2024-05-31 LAB — LIPASE, BLOOD: Lipase: 43 U/L (ref 11–51)

## 2024-05-31 LAB — URINALYSIS, ROUTINE W REFLEX MICROSCOPIC
Bilirubin Urine: NEGATIVE
Glucose, UA: NEGATIVE mg/dL
Hgb urine dipstick: NEGATIVE
Ketones, ur: NEGATIVE mg/dL
Leukocytes,Ua: NEGATIVE
Nitrite: NEGATIVE
Protein, ur: NEGATIVE mg/dL
Specific Gravity, Urine: 1.034 — ABNORMAL HIGH (ref 1.005–1.030)
pH: 6.5 (ref 5.0–8.0)

## 2024-05-31 MED ORDER — IOHEXOL 300 MG/ML  SOLN
100.0000 mL | Freq: Once | INTRAMUSCULAR | Status: AC | PRN
  Administered 2024-05-31: 100 mL via INTRAVENOUS

## 2024-05-31 NOTE — ED Triage Notes (Signed)
 Pt caox4 ambulatory NAD RUQ abd pain and bloating x1 wk. Pt states I am very familiar with constipation but this feels different like maybe a blockage. Last normal BM 1 wk ago, but states she had a lot of liquid come out after OTC meds on Friday.

## 2024-05-31 NOTE — ED Notes (Signed)
 Pt cannot provide urine sample at this time, will notify if urge comes up.

## 2024-05-31 NOTE — ED Notes (Signed)
 Reviewed discharge instructions and home care with pt. Pt verbalized understanding and had no further questions. Pt exited ED without complications.

## 2024-05-31 NOTE — ED Notes (Signed)
 Patient transported to CT

## 2024-05-31 NOTE — Discharge Instructions (Signed)
 You were evaluated in the emergency room for constipation.  Your lab work and imaging did not show any significant abnormality.  I would recommend taking MiraLAX  and a stool softener daily until you have a bowel movement.  You may additionally use an enema.  Please follow with your GI doctor and PCP for further evaluation.

## 2024-05-31 NOTE — ED Provider Notes (Signed)
 [place] EMERGENCY DEPARTMENT AT [facility] Provider Note   CSN: [id number] Arrival date & time: 05/31/24  1153     Patient presents with: Abdominal Pain   Beth Peters is a 73 y.o. female with history of achalasia presents with complaints of constipation with abdominal cramping.  Denies significant pain.  Describes feeling bloated.  Not associate with nausea, vomiting.  No urinary symptoms.  Reports that her last bowel movement was 2 weeks ago.  She was passing gas this morning.  No history of bowel obstruction. She has had a Heller myotomy with fundoplication.  Otherwise no other abdominal surgeries.    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   OA (osteoarthritis)    Osteopenia    PONV (postoperative nausea and vomiting)    Raynauds syndrome    Seasonal affective disorder    SUI (stress urinary incontinence, female)    Superficial thrombophlebitis    x2  left lower leg superficial in  2006  prior to vein stripping    Past Surgical History:  Procedure Laterality Date   CYSTOSCOPY N/A 07/14/2018   Procedure: CYSTOSCOPY;  Surgeon: Jannis Kate Norris, MD;  Location: [facility];  Service: Gynecology;  Laterality: N/A;   D & C HYSTEROSCOPY W/ RESECTION FIBROID  09-21-2002  dr fredrik sharps  [facility]   D & C HYSTEROSCOPY W/ RESECTION POLYP  12-24-2010  dr mandy  [facility]   DILATION AND CURETTAGE OF UTERUS     ESOPHAGEAL DILATION  2013  --- [facility]   Omega dilation for Achalasia   ESOPHAGOGASTRODUODENOSCOPY N/A 09/20/2017   Procedure: ESOPHAGOGASTRODUODENOSCOPY (EGD);  Surgeon: Rosalie Kitchens, MD;  Location: [facility];  Service: Endoscopy;  Laterality: N/A;   ESOPHAGOGASTRODUODENOSCOPY (EGD)  WITH PROPOFOL  N/A 04/13/2023   Procedure: ESOPHAGOGASTRODUODENOSCOPY (EGD) WITH PROPOFOL ;  Surgeon: Rosalie Kitchens, MD;  Location: [facility];  Service: Gastroenterology;  Laterality: N/A;   FOREIGN BODY REMOVAL  04/13/2023   Procedure: FOREIGN BODY REMOVAL;  Surgeon: Rosalie Kitchens, MD;  Location: [facility];  Service: Gastroenterology;;   HELLER MYOTOMY  2006   fundoplication repair hiatal hernia   KNEE ARTHROSCOPY Left 2006 &  09-27-2003   KNEE ARTHROSCOPY W/ SYNOVECTOMY Right 09-20-2009  dr melodi  [facility]   KNEE SURGERY Right 1962   RECTOCELE REPAIR N/A 07/14/2018   Procedure: POSTERIOR REPAIR (RECTOCELE);  Surgeon: Jertson, Jill Evelyn, MD;  Location: [facility];  Service: Gynecology;  Laterality: N/A;   ROTATOR CUFF REPAIR Right 09/2011   skin cancker  01/12/2022   face   TONSILLECTOMY AND ADENOIDECTOMY  1961   TOTAL KNEE ARTHROPLASTY Bilateral left 02-05-2006 ;  right 08-14-2007   dr melodi  [facility]   TOTAL KNEE REVISION Right 11/26/2017   Procedure: Right knee polyethylene revision;  Surgeon: Melodi Lerner, MD;  Location: [facility];  Service: Orthopedics;  Laterality: Right;  Adductor Block   VARICOSE VEIN SURGERY  09/2005     Prior to Admission medications   Medication Sig Start Date End Date Taking? Authorizing Provider  buPROPion  (WELLBUTRIN  XL) 150 MG 24 hr tablet Take 1 tablet (150 mg total) by mouth daily. 05/04/24  Job Lukes, PA  CALCIUM PO Take 15 mLs by mouth daily.    [provider]  Cholecalciferol (VITAMIN D3) 1000 units CAPS Take 1,000 Units by mouth daily.    [provider]  diclofenac Sodium (VOLTAREN) 1 % GEL Voltaren 1 % topical gel    [provider]  dicyclomine  (BENTYL ) 10 MG capsule TAKE 1 CAPSULE (10 MG TOTAL) BY MOUTH 4 TIMES A DAY BEFORE MEALS AND AT BEDTIME Patient taking differently: as needed. 02/12/24   Job Lukes, PA  docusate sodium  (COLACE) 100 MG capsule Take 100 mg by mouth 2 (two) times daily as  needed for mild constipation.    [provider]  doxycycline  (VIBRA -TABS) 100 MG tablet Take 1 tablet (100 mg total) by mouth 2 (two) times daily. 05/04/24   Job Lukes, PA  EPINEPHrine  0.3 mg/0.3 mL IJ SOAJ injection Inject 0.3 mg into the muscle as needed for anaphylaxis. 10/31/22   Job Lukes, PA  esomeprazole (NEXIUM) 40 MG capsule Take 40 mg by mouth See admin instructions. Take 40 mg by mouth daily. May take additional 40 mg if needed for acid reflux    [provider]  hydrALAZINE  (APRESOLINE ) 10 MG tablet Take 1 tablet (10 mg total) by mouth 3 (three) times daily as needed (use as needed for SBP>180). 01/20/24   Kennyth Worth HERO, MD  hydrochlorothiazide  (HYDRODIURIL ) 25 MG tablet Take 1 tablet (25 mg total) by mouth daily. 05/04/24   Job Lukes, PA  hydrOXYzine  (ATARAX ) 50 MG tablet TAKE 0.5-1 TABLET (25-50 MG TOTAL) BY MOUTH 3 (THREE) TIMES DAILY AS NEEDED FOR ANXIETY. 02/11/24   Job Lukes, PA  venlafaxine  XR (EFFEXOR -XR) 37.5 MG 24 hr capsule Take 1 capsule (37.5 mg total) by mouth daily. 10/23/23   Prentiss Annabella LABOR, NP  NAPOLEON INHUB 250-50 MCG/ACT AEPB INHALE 1 PUFF INTO THE LUNGS IN THE MORNING AND AT BEDTIME. 07/04/23   Job Lukes, PA    Allergies: Dilaudid [hydromorphone hcl], Glucagon, Lactose intolerance (gi), Wasp venom, and Wasp venom protein    Review of Systems  Gastrointestinal:  Positive for abdominal pain.    Updated Vital Signs BP 138/80   Pulse 79   Temp 98.2 F (36.8 C) (Oral)   Resp 18   LMP 09/23/2012   SpO2 99%   Physical Exam Vitals and nursing note reviewed.  Constitutional:      General: She is not in acute distress.    Appearance: She is well-developed.  HENT:     Head: Normocephalic and atraumatic.  Eyes:     Conjunctiva/sclera: Conjunctivae normal.  Cardiovascular:     Rate and Rhythm: Normal rate and regular rhythm.     Heart sounds: No murmur heard. Pulmonary:     Effort: Pulmonary effort is  normal. No respiratory distress.     Breath sounds: Normal breath sounds.  Abdominal:     Palpations: Abdomen is soft.     Tenderness: There is abdominal tenderness.     Comments: Mild generalized abdominal tenderness worse to the right lower quadrant, soft nondistended  Musculoskeletal:        General: No swelling.     Cervical back: Neck supple.  Skin:    General: Skin is warm and dry.     Capillary Refill: Capillary refill takes less than 2 seconds.  Neurological:     Mental Status: She is alert.  Psychiatric:        Mood and Affect: Mood normal.     (all labs ordered  are listed, but only abnormal results are displayed) Labs Reviewed  CBC WITH DIFFERENTIAL/PLATELET  COMPREHENSIVE METABOLIC PANEL WITH GFR  LIPASE, BLOOD  URINALYSIS, ROUTINE W REFLEX MICROSCOPIC    EKG: None  Radiology: CT ABDOMEN PELVIS W CONTRAST Result Date: 05/31/2024 CLINICAL DATA:  Right upper quadrant abdominal pain. EXAM: CT ABDOMEN AND PELVIS WITH CONTRAST TECHNIQUE: Multidetector CT imaging of the abdomen and pelvis was performed using the standard protocol following bolus administration of intravenous contrast. RADIATION DOSE REDUCTION: This exam was performed according to the departmental dose-optimization program which includes automated exposure control, adjustment of the mA and/or kV according to patient size and/or use of iterative reconstruction technique. CONTRAST:  OMNIPAQUE  IOHEXOL  300 MG/ML  SOLN COMPARISON:  January 19, 2024. FINDINGS: Lower chest: No acute abnormality. Hepatobiliary: Redemonstrated hepatomegaly. No focal liver abnormality is seen. No gallstones, gallbladder wall thickening, or biliary dilatation. Pancreas: Unremarkable. No pancreatic ductal dilatation or surrounding inflammatory changes. Spleen: Normal in size without focal abnormality. Adrenals/Urinary Tract: Adrenal glands are unremarkable. Kidneys enhance symmetrically. No suspicious focal lesion. No renal or ureteral  calculi. No hydronephrosis. Bladder is unremarkable. Stomach/Bowel: Similar small hiatal hernia. Stomach is otherwise within normal limits. No obstruction. Appendix is not clearly identified. No pericecal inflammatory changes. Moderate volume of stool throughout the colon. Vascular/Lymphatic: Abdominal aorta is normal in caliber. Circumaortic left renal vein is again noted. No enlarged abdominopelvic lymph nodes. Reproductive: Status post hysterectomy. No adnexal masses. Other: No abdominopelvic ascites. No intraperitoneal free air. Stable small fat containing umbilical hernia. Musculoskeletal: No acute osseous abnormality. No suspicious osseous lesion. Similar multilevel degenerative changes of the thoracic spine with grade 1 retrolisthesis L2 on L3 and grade 1 anterolisthesis of L4 on L5. 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 IMPRESSION: 1. No acute localizing findings in the abdomen or pelvis. 2. Moderate volume of stool throughout the colon. 3. Hepatomegaly. 4. Similar small hiatal hernia. Electronically Signed   By: Harrietta Sherry M.D.   On: 05/31/2024 14:31     Procedures   Medications Ordered in the ED  iohexol  (OMNIPAQUE ) 300 MG/ML solution 100 mL (100 mLs Intravenous Contrast Given 05/31/24 1329)    Clinical Course as of 05/31/24 1448  Mon May 31, 2024  1329 Patient evaluated for complaints of constipation with bloating x 2 weeks.  Patient is hemodynamically stable.  On exam she has some mild generalized abdominal tenderness worse in the right lower quadrant.  Lab work is without any significant normality.  Will obtain CT imaging. [JT]  1441 CT ABDOMEN PELVIS W CONTRAST  No evidence of obstruction or acute abnormality.  Moderate stool burden. [JT]  1447 Patient will be discharged home.  Reviewed bowel regiment with her.  She has MiraLAX  and stool softeners at home.  Recommend taking this daily until she has a bowel movement.  She additionally has  enemas recommending trialing as well.  Encouraged to follow-up with GI and PCP.  Strict return precautions provided.  Patient is understanding and agreeable with plan. [JT]    Clinical Course User Index [JT] Donnajean Lynwood DEL, PA-C                                 Medical Decision Making Amount and/or Complexity of Data Reviewed Labs: ordered. Radiology: ordered. Decision-making details documented in ED Course.  Risk Prescription drug management.   This patient presents to the ED with chief complaint(s) of  constipation.  The complaint involves an extensive differential diagnosis and also carries with it a high risk of complications and morbidity.   Pertinent past medical history as listed in HPI  The differential diagnosis includes  SBO, malignancy, appendicitis, UTI, diverticulitis, cholecystitis Additional history obtained: Records reviewed Care Everywhere/External Records  Disposition:   Patient will be discharged home. The patient has been appropriately medically screened and/or stabilized in the ED. I have low suspicion for any other emergent medical condition which would require further screening, evaluation or treatment in the ED or require inpatient management. At time of discharge the patient is hemodynamically stable and in no acute distress. I have discussed work-up results and diagnosis with patient and answered all questions. Patient is agreeable with discharge plan. We discussed strict return precautions for returning to the emergency department and they verbalized understanding.     Social Determinants of Health:   none  This note was dictated with voice recognition software.  Despite best efforts at proofreading, errors may have occurred which can change the documentation meaning.       Final diagnoses:  Constipation, unspecified constipation type    ED Discharge Orders     None          Donnajean Lynwood DEL, PA-C 05/31/24 1448    Ruthe Cornet,  DO 06/04/24 1620

## 2024-06-03 ENCOUNTER — Telehealth: Payer: Self-pay

## 2024-06-03 NOTE — Telephone Encounter (Signed)
 Transition Care Management Follow-up Telephone Call Date of discharge and from where: 05/31/24; [facility] How have you been since you were released from the hospital? Better; successful bowel movement today Any questions or concerns? No  Items Reviewed: Did the pt receive and understand the discharge instructions provided? Yes  Medications obtained and verified? Yes  Other? Gastroenterology referral not placed by ED; will discuss with PCP Any new allergies since your discharge? No  Dietary orders reviewed? No Do you have support at home? Yes   Home Care and Equipment/Supplies: Were home health services ordered? no If so, what is the name of the agency?   Has the agency set up a time to come to the patient's home? not applicable Were any new equipment or medical supplies ordered?  No What is the name of the medical supply agency?  Were you able to get the supplies/equipment? not applicable Do you have any questions related to the use of the equipment or supplies? No  Functional Questionnaire: (I = Independent and D = Dependent) ADLs: I  Bathing/Dressing- I  Meal Prep- I  Eating- I  Maintaining continence- I  Transferring/Ambulation- I  Managing Meds- I  Follow up appointments reviewed:  PCP Hospital f/u appt confirmed? Yes  Scheduled to see Lucie Buttner, PA on 06/07/24 @ 1:40pm. Specialist Hospital f/u appt confirmed? No   Are transportation arrangements needed? No  If their condition worsens, is the pt aware to call PCP or go to the Emergency Dept.? Yes Was the patient provided with contact information for the PCP's office or ED? Yes Was to pt encouraged to call back with questions or concerns? Yes

## 2024-06-07 ENCOUNTER — Ambulatory Visit (INDEPENDENT_AMBULATORY_CARE_PROVIDER_SITE_OTHER): Admitting: Physician Assistant

## 2024-06-07 ENCOUNTER — Encounter: Payer: Self-pay | Admitting: Physician Assistant

## 2024-06-07 VITALS — BP 140/90 | HR 77 | Temp 97.9°F | Ht 61.0 in | Wt 151.2 lb

## 2024-06-07 DIAGNOSIS — I10 Essential (primary) hypertension: Secondary | ICD-10-CM | POA: Diagnosis not present

## 2024-06-07 DIAGNOSIS — R160 Hepatomegaly, not elsewhere classified: Secondary | ICD-10-CM | POA: Diagnosis not present

## 2024-06-07 DIAGNOSIS — Z23 Encounter for immunization: Secondary | ICD-10-CM | POA: Diagnosis not present

## 2024-06-07 DIAGNOSIS — K5900 Constipation, unspecified: Secondary | ICD-10-CM | POA: Diagnosis not present

## 2024-06-07 NOTE — Progress Notes (Signed)
 Beth Peters is a 73 y.o. female here for a follow up of a pre-existing problem.  History of Present Illness:   Chief Complaint  Patient presents with   Follow-up    Pt was seen in the ED on 11/17 for constipation. Denies abdominal pain and is moving bowels every other day.    Discussed the use of AI scribe software for clinical note transcription with the patient, who gave verbal consent to proceed.  History of Present Illness   Beth Peters is a 73 year old female who presents with severe constipation and abdominal pain.  She reports several days of severe constipation following recent travel and a hectic schedule. Her diet has mainly been liquid protein drinks with little fiber. She has taken morning and evening laxatives, Miralax , stool softeners, and orange juice without relief. She is not passing gas and only passed a small amount of brown liquid without formed stool.  Her abdominal pain is severe and progressive with abdominal swelling. The pain initially felt like a heart attack. She notes no gas and only brown liquid output without stool.  A recent CT scan showed hepatomegaly, which is new information to her. She has taken Celebrex for about fifteen years and previously used Tylenol  and alcohol. Since September she has been trying to lose weight and cut back on alcohol.  She reports a very busy lifestyle with recent travel and family obligations that have increased her stress, which she feels has disrupted her normal eating and bowel habits.        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   OA (osteoarthritis)    Osteopenia    PONV (postoperative nausea and vomiting)    Raynauds syndrome    Seasonal  affective disorder    SUI (stress urinary incontinence, female)    Superficial thrombophlebitis    x2  left lower leg superficial in  2006  prior to vein stripping      Social History   Tobacco Use   Smoking status: Never   Smokeless tobacco: Never  Vaping Use   Vaping status: Never Used  Substance Use Topics   Alcohol use: Yes    Alcohol/week: 8.0 standard drinks of alcohol    Types: 8 Glasses of wine per week   Drug use: No    Past Surgical History:  Procedure Laterality Date   CYSTOSCOPY N/A 07/14/2018   Procedure: CYSTOSCOPY;  Surgeon: Jannis Kate Norris, MD;  Location: [facility];  Service: Gynecology;  Laterality: N/A;   D & C HYSTEROSCOPY W/ RESECTION FIBROID  09-21-2002  dr fredrik sharps  [facility]   D & C HYSTEROSCOPY W/ RESECTION POLYP  12-24-2010  dr mandy  [facility]   DILATION AND CURETTAGE OF UTERUS     ESOPHAGEAL DILATION  2013  --- [facility]   Omega dilation for Achalasia   ESOPHAGOGASTRODUODENOSCOPY N/A 09/20/2017   Procedure: ESOPHAGOGASTRODUODENOSCOPY (EGD);  Surgeon: Rosalie Kitchens, MD;  Location: [facility];  Service: Endoscopy;  Laterality: N/A;   ESOPHAGOGASTRODUODENOSCOPY (EGD) WITH PROPOFOL  N/A 04/13/2023   Procedure: ESOPHAGOGASTRODUODENOSCOPY (EGD) WITH PROPOFOL ;  Surgeon: Rosalie Kitchens, MD;  Location: [facility];  Service: Gastroenterology;  Laterality:  N/A;   FOREIGN BODY REMOVAL  04/13/2023   Procedure: FOREIGN BODY REMOVAL;  Surgeon: Rosalie Kitchens, MD;  Location: [facility];  Service: Gastroenterology;;   HELLER MYOTOMY  2006   fundoplication repair hiatal hernia   KNEE ARTHROSCOPY Left 2006 &  09-27-2003   KNEE ARTHROSCOPY W/ SYNOVECTOMY Right 09-20-2009  dr melodi  [facility]   KNEE SURGERY Right 1962   RECTOCELE REPAIR N/A 07/14/2018   Procedure: POSTERIOR REPAIR (RECTOCELE);  Surgeon: Jertson, Jill Evelyn, MD;  Location: [facility];  Service: Gynecology;  Laterality: N/A;   ROTATOR CUFF REPAIR Right 09/2011   skin cancker  01/12/2022    face   TONSILLECTOMY AND ADENOIDECTOMY  1961   TOTAL KNEE ARTHROPLASTY Bilateral left 02-05-2006 ;  right 08-14-2007   dr melodi  [facility]   TOTAL KNEE REVISION Right 11/26/2017   Procedure: Right knee polyethylene revision;  Surgeon: Melodi Lerner, MD;  Location: [facility];  Service: Orthopedics;  Laterality: Right;  Adductor Block   VARICOSE VEIN SURGERY  09/2005    Family History  Problem Relation Age of Onset   Hypertension Father    Heart disease Father    Stroke Father    Cancer Father        Testicular, Bladder, Prostate, Lung   Heart disease Mother    Stroke Mother    Osteoporosis Mother    Uterine cancer Sister 28   Breast cancer Sister    Cancer Sister        rectal, liver,lymph nodes   Diabetes Maternal Grandfather    Heart disease Maternal Grandfather    Diabetes Paternal Grandmother    Breast cancer Maternal Aunt     Allergies  Allergen Reactions   Dilaudid [Hydromorphone Hcl] Hives, Shortness Of Breath and Itching   Glucagon Anaphylaxis   Lactose Intolerance (Gi)    Wasp Venom Itching and Swelling   Wasp Venom Protein Itching and Hives    Current Medications:   Current Outpatient Medications:    buPROPion  (WELLBUTRIN  XL) 150 MG 24 hr tablet, Take 1 tablet (150 mg total) by mouth daily., Disp: 90 tablet, Rfl: 1   CALCIUM PO, Take 15 mLs by mouth daily., Disp: , Rfl:    Cholecalciferol (VITAMIN D3) 1000 units CAPS, Take 1,000 Units by mouth daily., Disp: , Rfl:    diclofenac Sodium (VOLTAREN) 1 % GEL, Voltaren 1 % topical gel, Disp: , Rfl:    docusate sodium  (COLACE) 100 MG capsule, Take 100 mg by mouth 2 (two) times daily as needed for mild constipation., Disp: , Rfl:    EPINEPHrine  0.3 mg/0.3 mL IJ SOAJ injection, Inject 0.3 mg into the muscle as needed for anaphylaxis., Disp: 1 each, Rfl: 1   esomeprazole (NEXIUM) 40 MG capsule, Take 40 mg by mouth See admin instructions. Take 40 mg by mouth daily. May take additional 40 mg if needed for acid reflux, Disp: ,  Rfl:    hydrALAZINE  (APRESOLINE ) 10 MG tablet, Take 1 tablet (10 mg total) by mouth 3 (three) times daily as needed (use as needed for SBP>180)., Disp: 90 tablet, Rfl: 4   hydrochlorothiazide  (HYDRODIURIL ) 25 MG tablet, Take 1 tablet (25 mg total) by mouth daily., Disp: 90 tablet, Rfl: 1   hydrOXYzine  (ATARAX ) 50 MG tablet, TAKE 0.5-1 TABLET (25-50 MG TOTAL) BY MOUTH 3 (THREE) TIMES DAILY AS NEEDED FOR ANXIETY. (Patient taking differently: Only using PRN), Disp: 270 tablet, Rfl: 1   venlafaxine  XR (EFFEXOR -XR) 37.5 MG 24 hr capsule, Take 1 capsule (37.5  mg total) by mouth daily., Disp: 90 capsule, Rfl: 3   WIXELA INHUB 250-50 MCG/ACT AEPB, INHALE 1 PUFF INTO THE LUNGS IN THE MORNING AND AT BEDTIME., Disp: 60 each, Rfl: 2   Review of Systems:   Negative unless otherwise specified per HPI.  Vitals:   Vitals:   06/07/24 1349 06/07/24 1420  BP: (!) 150/96 (!) 140/90  Pulse: 77   Temp: 97.9 F (36.6 C)   TempSrc: Temporal   SpO2: 97%   Weight: 151 lb 4 oz (68.6 kg)   Height: 5' 1 (1.549 m)      Body mass index is 28.58 kg/m.  Physical Exam:   Physical Exam Vitals and nursing note reviewed.  Constitutional:      General: She is not in acute distress.    Appearance: She is well-developed. She is not ill-appearing or toxic-appearing.  Cardiovascular:     Rate and Rhythm: Normal rate and regular rhythm.     Pulses: Normal pulses.     Heart sounds: Normal heart sounds, S1 normal and S2 normal.  Pulmonary:     Effort: Pulmonary effort is normal.     Breath sounds: Normal breath sounds.  Abdominal:     General: Abdomen is flat. Bowel sounds are normal.     Palpations: Abdomen is soft.     Tenderness: There is no abdominal tenderness. There is no guarding or rebound.  Skin:    General: Skin is warm and dry.  Neurological:     Mental Status: She is alert.     GCS: GCS eye subscore is 4. GCS verbal subscore is 5. GCS motor subscore is 6.  Psychiatric:        Speech: Speech  normal.        Behavior: Behavior normal. Behavior is cooperative.     Assessment and Plan:   Assessment and Plan    Constipation Chronic constipation exacerbated by travel and dietary changes -- symptom(s) have resolved. - For prevention, recommend starting Miralax  at half a capful daily, increase to a full capful if needed after one week. Take consistently. - Provided constipation handout with information on other ways to increase fiber intake. - Maintain hydration and dietary fiber intake.  Hepatomegaly Enlarged liver on CT scan, consistent with previous imaging. No lesions or focal abnormalities. Possible factors include long-term Celebrex use and alcohol consumption. Liver function tests normal. - I advised for her to follow up with GI specialist for further evaluation and management of this. Thankfully liver enzymes are overall normal.  Essential hypertension Blood pressure elevated due to stress and physical exertion. - Continue to monitor blood pressure regularly. - Continue hydrochloroTHIAZIDE  25 mg daily and as needed hydralazine  10 mg three times daily   Lucie Buttner, PA-C

## 2024-07-01 ENCOUNTER — Other Ambulatory Visit

## 2024-07-13 ENCOUNTER — Ambulatory Visit: Payer: Self-pay | Admitting: Physician Assistant

## 2024-07-13 NOTE — Telephone Encounter (Signed)
 Pt is scheduled on 07/14/24 with Veludandi

## 2024-07-13 NOTE — Telephone Encounter (Signed)
 FYI Only or Action Required?: Action required by provider: Requesting medication for sinus infection.  Patient was last seen in primary care on 06/07/2024 by Job Lukes, PA.  Called Nurse Triage reporting Facial Pain.  Symptoms began a week ago.  Interventions attempted: OTC medications: Dayquil, left over cough medicine from when husband was sick.  Symptoms are: unchanged.  Triage Disposition: Home Care  Patient/caregiver understands and will follow disposition?: Yes    Reason for Disposition  [1] Sinus congestion as part of a cold AND [2] present < 10 days  Answer Assessment - Initial Assessment Questions Patient has tried Dayquil, and cough medication from when her husband was sick 3 weeks ago. Patient is requesting medication be sent to [facility] on file, please advise. Requesting call back [phone]  1. LOCATION: Where does it hurt?      Around sinuses and eyes  2. ONSET: When did the sinus pain start?  (e.g., hours, days)      Sore throat a week ago, progressed   3. SEVERITY: How bad is the pain?   (Scale 0-10; or none, mild, moderate or severe)     Moderate  4. RECURRENT SYMPTOM: Have you ever had sinus problems before? If Yes, ask: When was the last time? and What happened that time?      Almost once a year, last time in April. Was given prescription  5. NASAL CONGESTION: Is the nose blocked? If Yes, ask: Can you open it or must you breathe through your mouth?     Yes, off and on, one is blocked and the other is clear  6. NASAL DISCHARGE: Do you have discharge from your nose? If so ask, What color?     Brown/green  7. FEVER: Do you have a fever? If Yes, ask: What is it, how was it measured, and when did it start?      Denies  8. OTHER SYMPTOMS: Do you have any other symptoms? (e.g., sore throat, cough, earache, difficulty breathing)     Sore throat, cough, post nasal drip  Protocols used: Sinus Pain or  Congestion-A-AH  Message from Franky GRADE sent at 07/13/2024 10:17 AM EST  Reason for Triage: Patient has been experiencing a sore throat, cough and sinus drainage; however, the symptoms have gotten worse this week with dark color mucus discharge and sinus pressure.

## 2024-07-13 NOTE — Telephone Encounter (Signed)
 Please call pt and schedule an appt to be evaluated by another provider since Lucie is out of office. We can not prescribe medication without being seen.

## 2024-07-14 ENCOUNTER — Ambulatory Visit: Payer: Self-pay

## 2024-07-14 ENCOUNTER — Encounter: Admitting: Sports Medicine

## 2024-07-14 ENCOUNTER — Encounter: Payer: Self-pay | Admitting: Student in an Organized Health Care Education/Training Program

## 2024-07-14 ENCOUNTER — Ambulatory Visit (INDEPENDENT_AMBULATORY_CARE_PROVIDER_SITE_OTHER): Admitting: Student in an Organized Health Care Education/Training Program

## 2024-07-14 VITALS — BP 160/98 | HR 71 | Temp 97.9°F | Wt 153.0 lb

## 2024-07-14 DIAGNOSIS — J019 Acute sinusitis, unspecified: Secondary | ICD-10-CM | POA: Insufficient documentation

## 2024-07-14 DIAGNOSIS — J0100 Acute maxillary sinusitis, unspecified: Secondary | ICD-10-CM | POA: Diagnosis not present

## 2024-07-14 MED ORDER — GUAIFENESIN-CODEINE 100-10 MG/5ML PO SOLN: 5.0000 mL | Freq: Every evening | ORAL | 0 refills | Status: AC | PRN

## 2024-07-14 MED ORDER — FLUTICASONE PROPIONATE 50 MCG/ACT NA SUSP: 2.0000 | Freq: Every day | NASAL | 6 refills | Status: AC

## 2024-07-14 MED ORDER — DOXYCYCLINE HYCLATE 100 MG PO TABS: 100.0000 mg | ORAL_TABLET | Freq: Two times a day (BID) | ORAL | 0 refills | Status: AC

## 2024-07-14 NOTE — Telephone Encounter (Signed)
 Noted

## 2024-07-14 NOTE — Progress Notes (Signed)
 "  Acute Office Visit  Patient ID: Beth Peters, female    DOB: [birth year], 73 y.o.   MRN: [id number]  PCP: Job Lukes, PA  Chief Complaint  Patient presents with   Cough     Sore throat, congestion, post nasal drip, coughing all night cannot sleep, brown thick mucus, possible sinus infection.  1.5 weeks     Subjective:     HPI  Discussed the use of AI scribe software for clinical note transcription with the patient, who gave verbal consent to proceed.  History of Present Illness Beth Peters is a 73 year old female who presents with symptoms of a sinus infection.  She has been experiencing symptoms for approximately ten days, starting around December 20th or 21st. Initially, she had a runny nose with clear mucus, which progressed to yellow and thicker mucus by the second or third day. Currently, she has significant coughing, especially at night, which disrupts her sleep. The mucus has become dark yellow and brown. She experiences sinus pressure without significant pain. She has a history of sinus infections a couple of times a year and occasionally uses saline irrigation for her sinuses.  Her asthma is minor and has not flared recently. She takes Lexiva daily, which she finds very helpful. She is breathing shallowly because deep breaths trigger coughing. No shortness of breath is reported.  She has a lack of appetite but is drinking plenty of water, which she attributes to her achalasia. No nausea, vomiting, or diarrhea is present. She is not on any immune-suppressing medications.  She has a history of arthritis affecting every joint, and both of her parents had severe arthritis. She has had pneumonia four times in her life. She has used antibiotics like doxycycline  in the past for similar infections and has tolerated them well without issues like diarrhea. She cannot take large pills due to her eczema.      Objective:    BP (!) 160/98   Pulse 71   Temp  97.9 F (36.6 C) (Oral)   Wt 153 lb (69.4 kg)   LMP 09/23/2012   BMI 28.91 kg/m   Physical Exam  Gen: Tired appearing woman Ears: Bilateral tympanic membranes look normal with no middle ear effusion, no erythema, normal light reflex Mouth: Crowded posterior oropharynx, no oral lesions Neck: No tender cervical adenopathy Heart: Regular, no murmur Lungs: Unlabored, frequent coughing, no wheezing, no crackles      Assessment & Plan:   Problem List Items Addressed This Visit       Unprioritized   Acute sinusitis - Primary   A likely bacterial sinus infection follows a viral URI, with no signs of pneumonia or asthma exacerbation. Antibiotics may slightly hasten recovery, so focus on reducing inflammation. Prescribed doxycycline . Recommended saline sinus irrigation and Flonase  intranasally, both twice daily. Advised ibuprofen  or Tylenol  for discomfort. Prescribed guaifenesin  with codeine  for nighttime cough, cautioning about fall risk in those over 70. Instructed to monitor cough and contact provider if it persists beyond six weeks.      Relevant Medications   doxycycline  (VIBRA -TABS) 100 MG tablet   guaiFENesin -codeine  100-10 MG/5ML syrup   fluticasone  (FLONASE ) 50 MCG/ACT nasal spray     Meds ordered this encounter  Medications   doxycycline  (VIBRA -TABS) 100 MG tablet    Sig: Take 1 tablet (100 mg total) by mouth 2 (two) times daily for 7 days.    Dispense:  14 tablet    Refill:  0   guaiFENesin -codeine   100-10 MG/5ML syrup    Sig: Take 5 mLs by mouth at bedtime as needed.    Dispense:  120 mL    Refill:  0   fluticasone  (FLONASE ) 50 MCG/ACT nasal spray    Sig: Place 2 sprays into both nostrils daily.    Dispense:  16 g    Refill:  6    No follow-ups on file.  Cleatus Debby Specking, MD [facility]   "

## 2024-07-14 NOTE — Telephone Encounter (Signed)
 FYI Only or Action Required?: FYI only for provider: appointment scheduled on 07/14/24.  Patient was last seen in primary care on 07/14/2024 by Sherlynn Madden, MD.  Called Nurse Triage reporting Sinusitis and Cough.  Symptoms began a week ago.  Interventions attempted: OTC medications: Dayquil and nyquil.  Symptoms are: gradually worsening.  Triage Disposition: See PCP When Office is Open (Within 3 Days)  Patient/caregiver understands and will follow disposition?: Yes    1.5 weeks ago onset sore throat, congestion, post nasal drip with clear drainage and cough. Mucus brown and thick now when coughing.  No SOB. No fever. Had a virtual visit today but was unable to complete d/t technical difficulties. Scheduled appt with provider at different office in pt region today d/t no availability at home office with any provider within timeframe. Advised to call back for worsening symptoms.    Copied from CRM #[id number]. Topic: Clinical - Red Word Triage >> Jul 14, 2024 10:10 AM Hadassah PARAS wrote: Red Word that prompted transfer to Nurse Triage: Sore throat, congestion, post nasal drip, coughing all night cannot sleep, brown thick mucus, possible sinus infection. Transferred to NT Reason for Disposition  [1] Sinus congestion (pressure, fullness) AND [2] present > 10 days  Answer Assessment - Initial Assessment Questions 1. LOCATION: Where does it hurt?      Sinuses  2. ONSET: When did the sinus pain start?  (e.g., hours, days)      1.5 weeks ago  3. SEVERITY: How bad is the pain?   (Scale 0-10; or none, mild, moderate or severe)     4/10  4. RECURRENT SYMPTOM: Have you ever had sinus problems before? If Yes, ask: When was the last time? and What happened that time?      Has had sinus infection in the past, had to take z-packs or doxycyline in the past. Took cough syrup with codeine  and guaifenesin    5. NASAL CONGESTION: Is the nose blocked? If Yes, ask: Can you open it  or must you breathe through your mouth?     Able to breath through nose  6. NASAL DISCHARGE: Do you have discharge from your nose? If so ask, What color?     Clear thick yellow drainage from nose  7. FEVER: Do you have a fever? If Yes, ask: What is it, how was it measured, and when did it start?      Denies  8. OTHER SYMPTOMS: Do you have any other symptoms? (e.g., sore throat, cough, earache, difficulty breathing)     Cough with brown mucus, sore throat, post nasal drip  Protocols used: Sinus Pain or Congestion-A-AH

## 2024-07-14 NOTE — Patient Instructions (Signed)
" °  VISIT SUMMARY: Today, you were seen for symptoms of a sinus infection that have been ongoing for about ten days. You reported a runny nose that progressed to thicker, yellow mucus, significant nighttime coughing, and sinus pressure. Your asthma remains well-controlled, and you have no shortness of breath. You are drinking plenty of water despite a lack of appetite, and there are no signs of pneumonia.  YOUR PLAN: -ACUTE SINUSITIS: Acute sinusitis is an infection of the sinuses that can cause symptoms like a runny nose, thick mucus, coughing, and sinus pressure. You likely have a bacterial sinus infection following a viral upper respiratory infection. To help you recover, you have been prescribed doxycycline . Additionally, you should use saline sinus irrigation and Flonase  twice daily to reduce inflammation. For discomfort, you can take ibuprofen  or Tylenol . To help with your nighttime cough, you have been prescribed guaifenesin  with codeine , but be cautious as it can increase the risk of falls, especially in those over 70. Monitor your cough and contact us  if it persists beyond six weeks.  -ASTHMA: Asthma is a condition where your airways narrow and swell, which can make breathing difficult. Your minor asthma is well-controlled with Lexiva, and there are no signs of exacerbation or wheezing. Continue taking Lexiva as prescribed.  INSTRUCTIONS: Please follow the prescribed treatment plan for your sinus infection and continue taking Lexiva for your asthma. If your cough persists beyond six weeks, contact our office. Be cautious when using guaifenesin  with codeine , especially at night, to avoid falls.    "

## 2024-07-14 NOTE — Assessment & Plan Note (Signed)
 A likely bacterial sinus infection follows a viral URI, with no signs of pneumonia or asthma exacerbation. Antibiotics may slightly hasten recovery, so focus on reducing inflammation. Prescribed doxycycline . Recommended saline sinus irrigation and Flonase  intranasally, both twice daily. Advised ibuprofen  or Tylenol  for discomfort. Prescribed guaifenesin  with codeine  for nighttime cough, cautioning about fall risk in those over 70. Instructed to monitor cough and contact provider if it persists beyond six weeks.

## 2024-07-14 NOTE — Progress Notes (Unsigned)
 This encounter was created in error - please disregard.

## 2024-07-22 ENCOUNTER — Encounter (INDEPENDENT_AMBULATORY_CARE_PROVIDER_SITE_OTHER): Payer: Self-pay | Admitting: Otolaryngology

## 2024-07-22 ENCOUNTER — Ambulatory Visit (INDEPENDENT_AMBULATORY_CARE_PROVIDER_SITE_OTHER): Admitting: Otolaryngology

## 2024-07-22 VITALS — BP 138/83 | HR 90 | Ht 62.0 in | Wt 150.0 lb

## 2024-07-22 DIAGNOSIS — J343 Hypertrophy of nasal turbinates: Secondary | ICD-10-CM | POA: Diagnosis not present

## 2024-07-22 DIAGNOSIS — H6982 Other specified disorders of Eustachian tube, left ear: Secondary | ICD-10-CM | POA: Diagnosis not present

## 2024-07-22 DIAGNOSIS — J310 Chronic rhinitis: Secondary | ICD-10-CM | POA: Diagnosis not present

## 2024-07-22 DIAGNOSIS — R42 Dizziness and giddiness: Secondary | ICD-10-CM

## 2024-07-22 DIAGNOSIS — J342 Deviated nasal septum: Secondary | ICD-10-CM

## 2024-07-22 MED ORDER — FLUTICASONE PROPIONATE 50 MCG/ACT NA SUSP: 2.0000 | Freq: Every day | NASAL | 10 refills | Status: AC

## 2024-07-22 NOTE — Progress Notes (Signed)
 CC: Left ear fullness, chronic nasal congestion, and dizziness  Discussed the use of AI scribe software for clinical note transcription with the patient, who gave verbal consent to proceed.  History of Present Illness Beth Peters is a 74 year old female who presents for evaluation of chronic left ear fullness, chronic nasal congestion, and dizziness.  She reports persistent left ear fullness described as a constant plugged sensation, which is exacerbated during air travel and resulted in severe otalgia during a flight from [place] in September. The fullness is unilateral and chronic, while right ear fullness occurs only during acute illness. She has not undergone tympanostomy tube placement. She notes diminished hearing on the left, although audiometry performed four to five years ago was normal without significant asymmetry.  Nasal congestion is longstanding and most pronounced in the superior nasal passages. She experiences daily clear postnasal drip and frequently resorts to mouth breathing, resulting in waking with a sore throat every morning for the past seven years. She has a history of seasonal allergic rhinitis, primarily to pollen, and previously used intranasal fluticasone  seven to eight years ago, but discontinued use after relocating to [place]  three years ago. She has been informed of a deviated nasal septum and reports chronic nasal congestion and facial fullness, without facial pain.  She describes intermittent dizziness characterized by disequilibrium, triggered by rapid head movements or turning, such as walking down grocery aisles or playing with her granddaughters. She denies vertigo or a spinning sensation.   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   OA (osteoarthritis)    Osteopenia    PONV (postoperative nausea and vomiting)    Raynauds syndrome    Seasonal affective disorder    SUI (stress urinary incontinence, female)    Superficial thrombophlebitis    x2  left lower leg superficial in  2006  prior to vein stripping     Past Surgical History:  Procedure Laterality Date   CYSTOSCOPY N/A 07/14/2018   Procedure: CYSTOSCOPY;  Surgeon: Jannis Kate Norris, MD;  Location: [facility];  Service: Gynecology;  Laterality: N/A;   D & C HYSTEROSCOPY W/ RESECTION FIBROID  09-21-2002  dr fredrik sharps  [facility]   D & C HYSTEROSCOPY W/ RESECTION POLYP  12-24-2010  dr mandy  [facility]   DILATION AND CURETTAGE OF UTERUS     ESOPHAGEAL DILATION  2013  --- [facility]   Omega dilation for Achalasia   ESOPHAGOGASTRODUODENOSCOPY N/A 09/20/2017   Procedure: ESOPHAGOGASTRODUODENOSCOPY (EGD);  Surgeon: Rosalie Kitchens, MD;  Location: [facility];  Service: Endoscopy;  Laterality: N/A;   ESOPHAGOGASTRODUODENOSCOPY (EGD) WITH PROPOFOL  N/A 04/13/2023   Procedure: ESOPHAGOGASTRODUODENOSCOPY (EGD) WITH PROPOFOL ;  Surgeon: Rosalie Kitchens, MD;  Location: [facility];  Service: Gastroenterology;  Laterality: N/A;   FOREIGN BODY REMOVAL  04/13/2023   Procedure: FOREIGN BODY REMOVAL;  Surgeon: Rosalie Kitchens, MD;  Location: [facility];  Service: Gastroenterology;;   HELLER MYOTOMY  2006   fundoplication repair hiatal hernia   KNEE ARTHROSCOPY Left 2006 &  09-27-2003   KNEE ARTHROSCOPY W/ SYNOVECTOMY Right 09-20-2009  dr melodi  [facility]   KNEE SURGERY Right 1962   RECTOCELE REPAIR N/A 07/14/2018  Procedure: POSTERIOR REPAIR (RECTOCELE);  Surgeon: Jertson, Jill Evelyn, MD;  Location: [facility];  Service: Gynecology;  Laterality: N/A;   ROTATOR CUFF REPAIR Right 09/2011   skin cancker  01/12/2022   face   TONSILLECTOMY AND ADENOIDECTOMY  1961   TOTAL KNEE ARTHROPLASTY Bilateral left 02-05-2006 ;  right 08-14-2007   dr  melodi  [facility]   TOTAL KNEE REVISION Right 11/26/2017   Procedure: Right knee polyethylene revision;  Surgeon: Melodi Lerner, MD;  Location: [facility];  Service: Orthopedics;  Laterality: Right;  Adductor Block   VARICOSE VEIN SURGERY  09/2005    Family History  Problem Relation Age of Onset   Hypertension Father    Heart disease Father    Stroke Father    Cancer Father        Testicular, Bladder, Prostate, Lung   Heart disease Mother    Stroke Mother    Osteoporosis Mother    Uterine cancer Sister 87   Breast cancer Sister    Cancer Sister        rectal, liver,lymph nodes   Diabetes Maternal Grandfather    Heart disease Maternal Grandfather    Diabetes Paternal Grandmother    Breast cancer Maternal Aunt     Social History:  reports that she has never smoked. She has never used smokeless tobacco. She reports current alcohol use of about 8.0 standard drinks of alcohol per week. She reports that she does not use drugs.  Allergies: Allergies[1]  Prior to Admission medications  Medication Sig Start Date End Date Taking? Authorizing Provider  buPROPion  (WELLBUTRIN  XL) 150 MG 24 hr tablet Take 1 tablet (150 mg total) by mouth daily. 05/04/24  Yes Job Lukes, PA  CALCIUM PO Take 15 mLs by mouth daily.   Yes [provider]  Cholecalciferol (VITAMIN D3) 1000 units CAPS Take 1,000 Units by mouth daily.   Yes [provider]  diclofenac Sodium (VOLTAREN) 1 % GEL Voltaren 1 % topical gel   Yes [provider]  docusate sodium  (COLACE) 100 MG capsule Take 100 mg by mouth 2 (two) times daily as needed for mild constipation.   Yes [provider]  EPINEPHrine  0.3 mg/0.3 mL IJ SOAJ injection Inject 0.3 mg into the muscle as needed for anaphylaxis. 10/31/22  Yes Job Lukes, PA  esomeprazole (NEXIUM) 40 MG capsule Take 40 mg by mouth See admin instructions. Take 40 mg by mouth daily. May take additional 40 mg if needed for acid reflux   Yes [provider]  fluticasone  (FLONASE ) 50 MCG/ACT nasal spray Place 2 sprays into both nostrils daily. 07/14/24  Yes Jerrell Cleatus Ned, MD  fluticasone  (FLONASE ) 50 MCG/ACT nasal spray Place 2 sprays into both nostrils daily. 07/22/24  Yes Karis Clunes, MD  guaiFENesin -codeine  100-10 MG/5ML syrup Take 5 mLs by mouth at bedtime as needed. 07/14/24  Yes Jerrell Cleatus Ned, MD  hydrALAZINE  (APRESOLINE ) 10 MG tablet Take 1 tablet (10 mg total) by mouth 3 (three) times daily as needed (use as needed for SBP>180). 01/20/24  Yes Kennyth Worth HERO, MD  hydrochlorothiazide  (HYDRODIURIL ) 25 MG tablet Take 1 tablet (25 mg total) by mouth daily. 05/04/24  Yes Job Lukes, PA  venlafaxine  XR (EFFEXOR -XR) 37.5 MG 24 hr capsule Take 1 capsule (37.5 mg total) by mouth daily. 10/23/23  Yes Prentiss Annabella LABOR, NP  WIXELA INHUB 250-50 MCG/ACT AEPB INHALE 1 PUFF INTO THE LUNGS IN THE MORNING AND AT BEDTIME. 07/04/23  Yes Job Lukes, PA  hydrOXYzine  (ATARAX ) 50 MG tablet TAKE 0.5-1 TABLET (25-50 MG TOTAL) BY MOUTH 3 (THREE) TIMES DAILY AS NEEDED FOR ANXIETY. Patient not taking: Reported on 07/22/2024 02/11/24   Job Lukes, PA    Blood pressure 138/83, pulse 90, height 5' 2 (1.575 m), weight 150 lb (68 kg), last menstrual period 09/23/2012, SpO2 97%. Exam: General: Communicates without difficulty, well nourished, no acute distress. Head: Normocephalic, no evidence injury, no tenderness, facial buttresses intact without stepoff. Face/sinus: No tenderness to palpation and percussion. Facial movement is normal and symmetric. Eyes: PERRL, EOMI. No scleral icterus, conjunctivae clear. Neuro: CN II exam reveals vision grossly intact.  No nystagmus at any point of gaze. Ears: Auricles well formed without lesions.  Ear canals are intact without mass or lesion.  No erythema or edema is appreciated.  The TMs are intact without fluid. Nose: External evaluation reveals normal support and skin without lesions.  Dorsum is  intact.  Anterior rhinoscopy reveals congested mucosa over anterior aspect of inferior turbinates and deviated septum.  No purulence noted. Oral:  Oral cavity and oropharynx are intact, symmetric, without erythema or edema.  Mucosa is moist without lesions. Neck: Full range of motion without pain.  There is no significant lymphadenopathy.  No masses palpable.  Thyroid  bed within normal limits to palpation.  Parotid glands and submandibular glands equal bilaterally without mass.  Trachea is midline. Neuro:  CN 2-12 grossly intact. Vestibular: No nystagmus at any point of gaze. Dix Hallpike negative.  Vestibular: There is no nystagmus with pneumatic pressure on either tympanic membrane or Valsalva. The cerebellar examination is unremarkable.    Procedure:  Flexible Nasal Endoscopy: Description: Risks, benefits, and alternatives of flexible endoscopy were explained to the patient.  Specific mention was made of the risk of throat numbness with difficulty swallowing, possible bleeding from the nose and mouth, and pain from the procedure.  The patient gave oral consent to proceed.  The flexible scope was inserted into the right nasal cavity.  Endoscopy of the interior nasal cavity, superior, inferior, and middle meatus was performed. The sphenoid-ethmoid recess was examined. Edematous mucosa was noted.  No polyp, mass, or lesion was appreciated. Nasal septal deviation noted. Olfactory cleft was clear.  Nasopharynx was clear.  Turbinates were hypertrophied but without mass.  The procedure was repeated on the contralateral side with similar findings.  The patient tolerated the procedure well.  Assessment & Plan Left ear eustachian tube dysfunction Chronic left eustachian tube dysfunction with associated nasal congestion and postnasal drip. Symptoms are exacerbated by upper respiratory infections and air travel. No evidence of active infection.  Anticipated improvement with consistent intranasal corticosteroid use,  though response may require several weeks due to chronicity. Tympanostomy tube placement may be considered if symptoms persist, particularly for air travel-related otalgia. - Prescribed daily Flonase  (intranasal corticosteroid) with instruction on proper administration technique to minimize epistaxis and maximize efficacy. - Advised that symptomatic improvement may require several weeks. - Recommended regular Valsalva maneuvers to equalize middle ear pressure. - Scheduled follow-up in six weeks to reassess symptoms and response to therapy. - Discussed possible tympanostomy tube placement if symptoms persist, especially for air travel-related otalgia.  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 This anatomical abnormality contributes to chronic rhinitis and eustachian tube dysfunction.  - Performed nasal endoscopy to confirm septal deviation and turbinate hypertrophy.  The nasal endoscopy findings  are reviewed with the patient. - Provided education regarding anatomical findings and her contribution to symptoms. - Flonase  nasal spray 2 sprays each nostril daily.  The importance of consistent daily use is discussed.  Recurrent dizziness Recurrent dizziness of unknown etiology. The possible differential diagnoses include transient BPPV, vestibular migraine, Meniere's disease, peripheral vestibular dysfunction, or other central/systemic causes.   - The pathophysiology of vestibular dysfunction and dizziness are discussed extensively with the patient. The possible differential diagnoses are reviewed. Questions are invited and answered.     Breawna Montenegro W Pernell Dikes 07/22/2024, 6:15 PM      [1]  Allergies Allergen Reactions   Dilaudid [Hydromorphone Hcl] Hives, Shortness Of Breath and Itching   Glucagon Anaphylaxis    Lactose Intolerance (Gi)    Wasp Venom Itching and Swelling   Wasp Venom Protein Itching and Hives

## 2024-07-23 DIAGNOSIS — J343 Hypertrophy of nasal turbinates: Secondary | ICD-10-CM | POA: Insufficient documentation

## 2024-07-23 DIAGNOSIS — H6982 Other specified disorders of Eustachian tube, left ear: Secondary | ICD-10-CM | POA: Insufficient documentation

## 2024-07-23 DIAGNOSIS — J310 Chronic rhinitis: Secondary | ICD-10-CM | POA: Insufficient documentation

## 2024-07-23 DIAGNOSIS — R42 Dizziness and giddiness: Secondary | ICD-10-CM | POA: Insufficient documentation

## 2024-08-14 ENCOUNTER — Other Ambulatory Visit: Payer: Self-pay | Admitting: Physician Assistant

## 2024-09-02 ENCOUNTER — Ambulatory Visit (INDEPENDENT_AMBULATORY_CARE_PROVIDER_SITE_OTHER): Admitting: Otolaryngology

## 2024-09-13 ENCOUNTER — Encounter: Admitting: Physician Assistant
